# Patient Record
Sex: Male | Born: 1955 | Race: White | Hispanic: No | Smoking: Current every day smoker
Health system: Southern US, Community
[De-identification: ages and names within clinical notes are randomized; demographics above are authoritative.]

## PROBLEM LIST (undated history)

## (undated) ENCOUNTER — Ambulatory Visit: Admission: EM

## (undated) DIAGNOSIS — E119 Type 2 diabetes mellitus without complications: Secondary | ICD-10-CM

## (undated) DIAGNOSIS — I10 Essential (primary) hypertension: Secondary | ICD-10-CM

## (undated) DIAGNOSIS — J449 Chronic obstructive pulmonary disease, unspecified: Secondary | ICD-10-CM

---

## 1998-05-11 ENCOUNTER — Ambulatory Visit (HOSPITAL_COMMUNITY): Admission: RE | Admit: 1998-05-11 | Discharge: 1998-05-11 | Payer: Self-pay | Admitting: Cardiology

## 1998-05-30 DIAGNOSIS — I428 Other cardiomyopathies: Secondary | ICD-10-CM | POA: Insufficient documentation

## 2000-06-30 ENCOUNTER — Emergency Department (HOSPITAL_COMMUNITY): Admission: EM | Admit: 2000-06-30 | Discharge: 2000-06-30 | Payer: Self-pay | Admitting: Emergency Medicine

## 2004-09-14 ENCOUNTER — Ambulatory Visit: Payer: Self-pay | Admitting: Internal Medicine

## 2005-03-20 ENCOUNTER — Ambulatory Visit: Payer: Self-pay | Admitting: Family Medicine

## 2005-04-23 ENCOUNTER — Ambulatory Visit: Payer: Self-pay | Admitting: Family Medicine

## 2005-05-13 ENCOUNTER — Ambulatory Visit: Payer: Self-pay | Admitting: Family Medicine

## 2005-05-29 ENCOUNTER — Ambulatory Visit: Payer: Self-pay | Admitting: Family Medicine

## 2005-09-28 ENCOUNTER — Emergency Department: Payer: Self-pay | Admitting: General Practice

## 2005-09-28 ENCOUNTER — Other Ambulatory Visit: Payer: Self-pay

## 2005-11-28 ENCOUNTER — Ambulatory Visit: Payer: Self-pay | Admitting: Family Medicine

## 2005-12-02 ENCOUNTER — Ambulatory Visit: Payer: Self-pay | Admitting: Family Medicine

## 2005-12-18 ENCOUNTER — Ambulatory Visit: Payer: Self-pay | Admitting: Family Medicine

## 2005-12-23 ENCOUNTER — Ambulatory Visit: Payer: Self-pay | Admitting: Family Medicine

## 2006-01-03 ENCOUNTER — Ambulatory Visit: Payer: Self-pay | Admitting: Family Medicine

## 2006-03-24 ENCOUNTER — Ambulatory Visit: Payer: Self-pay | Admitting: Family Medicine

## 2006-07-01 ENCOUNTER — Ambulatory Visit: Payer: Self-pay | Admitting: Family Medicine

## 2006-07-03 ENCOUNTER — Ambulatory Visit: Payer: Self-pay | Admitting: Family Medicine

## 2006-07-31 ENCOUNTER — Emergency Department (HOSPITAL_COMMUNITY): Admission: EM | Admit: 2006-07-31 | Discharge: 2006-08-01 | Payer: Self-pay | Admitting: Emergency Medicine

## 2006-09-23 ENCOUNTER — Ambulatory Visit: Payer: Self-pay | Admitting: Family Medicine

## 2006-09-30 ENCOUNTER — Ambulatory Visit: Payer: Self-pay | Admitting: Family Medicine

## 2006-10-06 ENCOUNTER — Ambulatory Visit: Payer: Self-pay | Admitting: Internal Medicine

## 2006-10-07 ENCOUNTER — Ambulatory Visit: Payer: Self-pay | Admitting: Gastroenterology

## 2006-10-28 ENCOUNTER — Ambulatory Visit: Payer: Self-pay | Admitting: Psychiatry

## 2006-10-28 ENCOUNTER — Inpatient Hospital Stay (HOSPITAL_COMMUNITY): Admission: AD | Admit: 2006-10-28 | Discharge: 2006-10-30 | Payer: Self-pay | Admitting: Psychiatry

## 2006-11-13 ENCOUNTER — Ambulatory Visit: Payer: Self-pay | Admitting: Family Medicine

## 2006-12-08 ENCOUNTER — Ambulatory Visit: Payer: Self-pay | Admitting: Cardiology

## 2006-12-08 ENCOUNTER — Ambulatory Visit: Payer: Self-pay | Admitting: Internal Medicine

## 2006-12-08 ENCOUNTER — Inpatient Hospital Stay (HOSPITAL_COMMUNITY): Admission: EM | Admit: 2006-12-08 | Discharge: 2006-12-10 | Payer: Self-pay | Admitting: Emergency Medicine

## 2006-12-08 ENCOUNTER — Encounter: Payer: Self-pay | Admitting: Cardiology

## 2007-01-13 ENCOUNTER — Ambulatory Visit: Payer: Self-pay | Admitting: Internal Medicine

## 2007-01-15 ENCOUNTER — Ambulatory Visit: Payer: Self-pay | Admitting: *Deleted

## 2007-01-30 ENCOUNTER — Encounter: Payer: Self-pay | Admitting: Internal Medicine

## 2007-01-30 DIAGNOSIS — J4489 Other specified chronic obstructive pulmonary disease: Secondary | ICD-10-CM | POA: Insufficient documentation

## 2007-01-30 DIAGNOSIS — J449 Chronic obstructive pulmonary disease, unspecified: Secondary | ICD-10-CM

## 2007-01-30 DIAGNOSIS — F17200 Nicotine dependence, unspecified, uncomplicated: Secondary | ICD-10-CM

## 2007-01-30 DIAGNOSIS — I10 Essential (primary) hypertension: Secondary | ICD-10-CM | POA: Insufficient documentation

## 2007-01-30 DIAGNOSIS — F1010 Alcohol abuse, uncomplicated: Secondary | ICD-10-CM | POA: Insufficient documentation

## 2007-01-30 DIAGNOSIS — F1020 Alcohol dependence, uncomplicated: Secondary | ICD-10-CM | POA: Insufficient documentation

## 2007-02-17 ENCOUNTER — Ambulatory Visit: Payer: Self-pay | Admitting: Internal Medicine

## 2007-02-26 ENCOUNTER — Ambulatory Visit: Payer: Self-pay | Admitting: Internal Medicine

## 2007-03-05 ENCOUNTER — Ambulatory Visit: Payer: Self-pay | Admitting: *Deleted

## 2007-03-16 ENCOUNTER — Emergency Department (HOSPITAL_COMMUNITY): Admission: EM | Admit: 2007-03-16 | Discharge: 2007-03-16 | Payer: Self-pay | Admitting: Emergency Medicine

## 2007-04-19 ENCOUNTER — Ambulatory Visit: Payer: Self-pay | Admitting: Psychiatry

## 2007-04-19 ENCOUNTER — Inpatient Hospital Stay (HOSPITAL_COMMUNITY): Admission: AD | Admit: 2007-04-19 | Discharge: 2007-04-20 | Payer: Self-pay | Admitting: Psychiatry

## 2007-06-11 ENCOUNTER — Ambulatory Visit: Payer: Self-pay | Admitting: Internal Medicine

## 2007-06-12 ENCOUNTER — Encounter (INDEPENDENT_AMBULATORY_CARE_PROVIDER_SITE_OTHER): Payer: Self-pay | Admitting: Internal Medicine

## 2007-06-12 LAB — CONVERTED CEMR LAB
ALT: 14 units/L (ref 0–53)
AST: 19 units/L (ref 0–37)
BUN: 25 mg/dL — ABNORMAL HIGH (ref 6–23)
Creatinine, Ser: 1.75 mg/dL — ABNORMAL HIGH (ref 0.40–1.50)
Eosinophils Absolute: 0.1 10*3/uL (ref 0.0–0.7)
Glucose, Bld: 92 mg/dL (ref 70–99)
Lymphocytes Relative: 20 % (ref 12–46)
Monocytes Relative: 14 % — ABNORMAL HIGH (ref 3–11)
Neutro Abs: 3.3 10*3/uL (ref 1.7–7.7)
Potassium: 4 meq/L (ref 3.5–5.3)
RBC: 3.99 M/uL — ABNORMAL LOW (ref 4.22–5.81)
Sodium: 134 meq/L — ABNORMAL LOW (ref 135–145)
TSH: 1.636 microintl units/mL (ref 0.350–5.50)
Total Bilirubin: 0.6 mg/dL (ref 0.3–1.2)
WBC: 5.3 10*3/uL (ref 4.0–10.5)

## 2007-06-18 ENCOUNTER — Ambulatory Visit: Payer: Self-pay | Admitting: Internal Medicine

## 2007-06-18 DIAGNOSIS — G2581 Restless legs syndrome: Secondary | ICD-10-CM | POA: Insufficient documentation

## 2007-06-18 LAB — CONVERTED CEMR LAB
BUN: 13 mg/dL (ref 6–23)
Calcium: 9.4 mg/dL (ref 8.4–10.5)
Creatinine, Ser: 1.1 mg/dL (ref 0.40–1.50)
Glucose, Bld: 87 mg/dL (ref 70–99)
Potassium: 3.7 meq/L (ref 3.5–5.3)
Sodium: 138 meq/L (ref 135–145)

## 2007-07-18 ENCOUNTER — Emergency Department (HOSPITAL_COMMUNITY): Admission: EM | Admit: 2007-07-18 | Discharge: 2007-07-18 | Payer: Self-pay | Admitting: Emergency Medicine

## 2007-07-19 ENCOUNTER — Inpatient Hospital Stay (HOSPITAL_COMMUNITY): Admission: EM | Admit: 2007-07-19 | Discharge: 2007-07-23 | Payer: Self-pay | Admitting: Emergency Medicine

## 2007-08-19 ENCOUNTER — Emergency Department (HOSPITAL_COMMUNITY): Admission: EM | Admit: 2007-08-19 | Discharge: 2007-08-19 | Payer: Self-pay | Admitting: Emergency Medicine

## 2007-09-08 ENCOUNTER — Encounter (INDEPENDENT_AMBULATORY_CARE_PROVIDER_SITE_OTHER): Payer: Self-pay | Admitting: Nurse Practitioner

## 2007-11-15 ENCOUNTER — Inpatient Hospital Stay (HOSPITAL_COMMUNITY): Admission: EM | Admit: 2007-11-15 | Discharge: 2007-11-20 | Payer: Self-pay | Admitting: Emergency Medicine

## 2007-12-20 ENCOUNTER — Inpatient Hospital Stay (HOSPITAL_COMMUNITY): Admission: EM | Admit: 2007-12-20 | Discharge: 2007-12-24 | Payer: Self-pay | Admitting: Emergency Medicine

## 2008-01-19 ENCOUNTER — Encounter (INDEPENDENT_AMBULATORY_CARE_PROVIDER_SITE_OTHER): Payer: Self-pay | Admitting: Internal Medicine

## 2008-03-07 ENCOUNTER — Telehealth (INDEPENDENT_AMBULATORY_CARE_PROVIDER_SITE_OTHER): Payer: Self-pay | Admitting: Internal Medicine

## 2008-03-09 ENCOUNTER — Encounter (INDEPENDENT_AMBULATORY_CARE_PROVIDER_SITE_OTHER): Payer: Self-pay | Admitting: Internal Medicine

## 2008-03-18 ENCOUNTER — Ambulatory Visit: Payer: Self-pay | Admitting: Internal Medicine

## 2008-03-18 DIAGNOSIS — K861 Other chronic pancreatitis: Secondary | ICD-10-CM | POA: Insufficient documentation

## 2008-03-18 DIAGNOSIS — F329 Major depressive disorder, single episode, unspecified: Secondary | ICD-10-CM | POA: Insufficient documentation

## 2008-03-18 LAB — CONVERTED CEMR LAB
BUN: 11 mg/dL (ref 6–23)
CO2: 23 meq/L (ref 19–32)
Calcium: 10.6 mg/dL — ABNORMAL HIGH (ref 8.4–10.5)
Chloride: 94 meq/L — ABNORMAL LOW (ref 96–112)
Creatinine, Ser: 1.1 mg/dL (ref 0.40–1.50)
Glucose, Bld: 102 mg/dL — ABNORMAL HIGH (ref 70–99)

## 2008-03-25 ENCOUNTER — Ambulatory Visit: Payer: Self-pay | Admitting: Internal Medicine

## 2008-03-27 ENCOUNTER — Encounter (INDEPENDENT_AMBULATORY_CARE_PROVIDER_SITE_OTHER): Payer: Self-pay | Admitting: Internal Medicine

## 2008-04-08 ENCOUNTER — Encounter (INDEPENDENT_AMBULATORY_CARE_PROVIDER_SITE_OTHER): Payer: Self-pay | Admitting: Internal Medicine

## 2008-04-12 ENCOUNTER — Telehealth (INDEPENDENT_AMBULATORY_CARE_PROVIDER_SITE_OTHER): Payer: Self-pay | Admitting: Internal Medicine

## 2008-05-19 ENCOUNTER — Telehealth (INDEPENDENT_AMBULATORY_CARE_PROVIDER_SITE_OTHER): Payer: Self-pay | Admitting: Internal Medicine

## 2008-05-26 ENCOUNTER — Encounter (INDEPENDENT_AMBULATORY_CARE_PROVIDER_SITE_OTHER): Payer: Self-pay | Admitting: Internal Medicine

## 2008-06-01 ENCOUNTER — Encounter (INDEPENDENT_AMBULATORY_CARE_PROVIDER_SITE_OTHER): Payer: Self-pay | Admitting: *Deleted

## 2008-06-07 ENCOUNTER — Telehealth (INDEPENDENT_AMBULATORY_CARE_PROVIDER_SITE_OTHER): Payer: Self-pay | Admitting: Internal Medicine

## 2010-11-06 ENCOUNTER — Ambulatory Visit: Payer: Self-pay | Admitting: Internal Medicine

## 2010-11-30 ENCOUNTER — Encounter (INDEPENDENT_AMBULATORY_CARE_PROVIDER_SITE_OTHER): Payer: Self-pay | Admitting: Internal Medicine

## 2010-11-30 ENCOUNTER — Ambulatory Visit
Admission: RE | Admit: 2010-11-30 | Discharge: 2010-11-30 | Payer: Self-pay | Source: Home / Self Care | Attending: Internal Medicine | Admitting: Internal Medicine

## 2010-11-30 DIAGNOSIS — M545 Low back pain: Secondary | ICD-10-CM | POA: Insufficient documentation

## 2010-11-30 LAB — CONVERTED CEMR LAB
Glucose, Bld: 87 mg/dL (ref 70–99)
Ketones, urine, test strip: NEGATIVE
Nitrite: NEGATIVE
Sodium: 138 meq/L (ref 135–145)
Urobilinogen, UA: 1
pH: 5.5

## 2010-12-11 ENCOUNTER — Encounter: Admission: RE | Admit: 2010-12-11 | Payer: Self-pay | Source: Home / Self Care | Admitting: Internal Medicine

## 2010-12-14 ENCOUNTER — Ambulatory Visit: Admit: 2010-12-14 | Payer: Self-pay | Admitting: Internal Medicine

## 2010-12-22 ENCOUNTER — Encounter (INDEPENDENT_AMBULATORY_CARE_PROVIDER_SITE_OTHER): Payer: Self-pay | Admitting: Internal Medicine

## 2010-12-27 NOTE — Letter (Signed)
Summary: *HSN Results Follow up  [facility]  [address] [place], [place] [address]   Phone: [phone]  Fax: [phone]      12/22/2010   [name] [address] [place], [place]  [address]   Dear  Mr. [name],                            ____S.Drinkard,FNP   ____D. Gore,FNP       ____B. McPherson,MD   ____V. Rankins,MD    __X__E. Albena Comes,MD    ____N. Daphine Deutscher, FNP  ____D. Reche Dixon, MD    ____K. Philipp Deputy, MD    ____Other     This letter is to inform you that your recent test(s):  _______Pap Smear    ____X___Lab Test     _______X-ray    ____X__ is within acceptable limits  _______ requires a medication change  _______ requires a follow-up lab visit  _______ requires a follow-up visit with your provider   Comments:       _________________________________________________________ If you have any questions, please contact our office                     Sincerely,  Julieanne Manson MD [facility]

## 2010-12-27 NOTE — Assessment & Plan Note (Signed)
Summary: GET BACK ON HIS MEDS//KT   Vital Signs:  Patient profile:   55 year old male Height:      70 inches Weight:      160 pounds BMI:     23.04 O2 Sat:      96 % on Room air Temp:     97.7 degrees F oral Pulse rate:   110 / minute Pulse rhythm:   regular Resp:     20 per minute BP sitting:   162 / 110  (left arm) Cuff size:   regular  Vitals Entered By: Hale Drone CMA (November 30, 2010 3:34 PM)  O2 Flow:  Room air CC: Has not been here since July 2009. Went to prison in 08/10 -- would like to get back on medications for BP and allergies. 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  Is Patient Diabetic? No Pain Assessment Patient in pain? no       Does patient need assistance? Functional Status Self care Ambulation Impaired:Risk for fall   Serial Vital Signs/Assessments:                                PEF    PreRx  PostRx Time      O2 Sat  O2 Type     L/min  L/min  L/min   By 3:43 PM   96  %   Room air                          Hale Drone CMA  Comments: 3:43 PM Peak Flow: 340 - 470 - 500 By: Hale Drone CMA    CC:  Has not been here since July 2009. Went to prison in 08/10 -- would like to get back on medications for BP and allergies. 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 Marland Kitchen  History of Present Illness: 55 yo male who was seen once between 2008-2009 and has not been seen since here to reestablish. Was incarcerated in prison for drunk driving.  1.  Alcoholism:  Did not drink for 9 months while incarcerated.  Went to [employer] weekly as per parole for 5 weeks--that ended in June of 2010.  Was drinking already, though only occasionally.    2.  Recurrent Pancreatitis:  has not had a recurrence since in prison.  3.  Hypertension:  Needs medication.  Has not been on medication since release from prison  02/2009.    4.  Pain in low back bilaterally.  Can radiate down lateral right leg to knee at  times.  Walking or prolonged standing makes this worse.  Had Xrays in prison--was told had arthritis of back. 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  Naproxen helped.     Current Medications (verified): 1)  Trazodone Hcl 100 Mg  Tabs (Trazodone Hcl) .Marland Kitchen.. 1 Tablet By Mouth At Bedtime As Needed 2)  Adult Aspirin Ec Low Strength 81 Mg  Tbec (Aspirin) .... One By Mouth Qd 3)  Cozaar 100 Mg  Tabs (Losartan Potassium) .... One By Mouth At Bedtime 4)  Sertraline Hcl 100 Mg  Tabs (Sertraline Hcl) .... One By Mouth Daily 5)  Clonidine Hcl 0.1 Mg  Tabs (Clonidine Hcl) .Marland Kitchen.. 1 Tablet By Mouth  Two Times A Day 6)  Nasacort Aq 55 Mcg/act  Aers (Triamcinolone Acetonide(Nasal)) .... 2 Puffs in Each Nostril Daily 7)  Multivitamins   Tabs (Multiple Vitamin) .Marland Kitchen.. 1 Tab By Mouth Daily 8)  B-1 High Potency 100 Mg  Tabs (Thiamine Hcl) .Marland Kitchen.. 1 Tab By Mouth Daily 9)  Prilosec 40 Mg  Cpdr (Omeprazole) .Marland Kitchen.. 1 Cap By Mouth Two Times A Day  Allergies (verified): 1)  Codeine Phosphate (Codeine Phosphate)  Physical Exam  General:  NAD Lungs:  Normal respiratory effort, chest expands symmetrically. Lungs are clear to auscultation, no crackles or wheezes. Heart:  Normal rate and regular rhythm. S1 and S2 normal without gallop, murmur, click, rub or other extra sounds.  Radial pulses normal and equal Abdomen:  Bowel sounds positive,abdomen soft and non-tender without masses, organomegaly or hernias noted. Msk:  NT over lumbosacral spinous processes.  Some tenderness of bilateral paraspinous musculature Neurologic:  alert & oriented X3, strength normal in all extremities, gait normal, and DTRs symmetrical and normal.     Impression & Recommendations:  Problem # 1:  LOW BACK PAIN, CHRONIC (ICD-724.2)  His updated medication list for this problem includes:    Adult Aspirin Ec Low Strength 81 Mg Tbec (Aspirin) ..... One by mouth  qd  Orders: Physical Therapy Referral (PT)  Problem # 2:  ALCOHOLISM (ICD-303.90) Encouraged pt. to get off alcohol completely with hx of pancreatitis and incarceration for DWI Orders: Psychology Referral (Psychology)--Amanda Alessandra Bevels  Problem # 3:  HYPERTENSION (ICD-401.9) Restart meds. His updated medication list for this problem includes:    Lisinopril 40 Mg Tabs (Lisinopril) .Marland Kitchen... 1 tab by mouth daily    Clonidine Hcl 0.1 Mg Tabs (Clonidine hcl) .Marland Kitchen... 1 tablet by mouth two times a day  Orders: T-Basic Metabolic Panel [phone]) UA Dipstick w/o Micro (manual) (09811)  Complete Medication List: 1)  Trazodone Hcl 100 Mg Tabs (Trazodone hcl) .Marland Kitchen.. 1 tablet by mouth at bedtime as needed 2)  Adult Aspirin Ec Low Strength 81 Mg Tbec (Aspirin) .... One by mouth qd 3)  Lisinopril 40 Mg Tabs (Lisinopril) .Marland Kitchen.. 1 tab by mouth daily 4)  Sertraline Hcl 100 Mg Tabs (Sertraline hcl) .... One by mouth daily 5)  Clonidine Hcl 0.1 Mg Tabs (Clonidine hcl) .Marland Kitchen.. 1 tablet by mouth two times a day 6)  Nasacort Aq 55 Mcg/act Aers (Triamcinolone acetonide(nasal)) .... 2 puffs in each nostril daily 7)  Multivitamins Tabs (Multiple vitamin) .Marland Kitchen.. 1 tab by mouth daily 8)  B-1 High Potency 100 Mg Tabs (Thiamine hcl) .Marland Kitchen.. 1 tab by mouth daily 9)  Prilosec 40 Mg Cpdr (Omeprazole) .Marland Kitchen.. 1 cap by mouth two times a day  Other Orders: Pulse Oximetry (single measurment) (94760) Peak Flow Rate ([address]) Flu Vaccine 51yrs + (91478) Admin 1st Vaccine (29562)  Patient Instructions: 1)  Referral to [facility] 2)  NUrse visit for bp check and BMET in 2 weeks--just starting Lisinopril--do not expect bp to be at goal. 3)  Follow up with Dr. Delrae Alfred in 3 months --hypertension/back pain Prescriptions: LISINOPRIL 40 MG TABS (LISINOPRIL) 1 tab by mouth daily  #30 x 6   Entered and Authorized by:   Julieanne Manson MD   Signed by:   Julieanne Manson MD on 12/22/2010   Method used:   Electronically to         [facility] (retail)       [address]       [place]  [place], [place]  [address]       Ph: [phone]       Fax: [phone]   RxID:   [id number]    Orders Added: 1)  Pulse Oximetry (single measurment) [94760] 2)  Peak Flow Rate [[address]] 3)  Flu Vaccine 22yrs + [90658] 4)  Admin 1st Vaccine [90471] 5)  T-Basic Metabolic Panel [[id number]] 6)  Est. Patient Level III [25956] 7)  UA Dipstick w/o Micro (manual) [81002] 8)  Psychology Referral [Psychology] 9)  Physical Therapy Referral [PT]   Immunizations Administered:  Influenza Vaccine # 1:    Vaccine Type: Fluvax 3+    Site: left deltoid    Mfr: GlaxoSmithKline    Dose: 0.5 ml    Route: IM    Given by: Hale Drone CMA    Exp. Date: 05/11/2011    Lot #: [id number]    VIS given: 06/05/10 version given November 30, 2010.  Flu Vaccine Consent Questions:    Do you have a history of severe allergic reactions to this vaccine? no    Any prior history of allergic reactions to egg and/or gelatin? no    Do you have a sensitivity to the preservative Thimersol? no    Do you have a past history of Guillan-Barre Syndrome? no    Do you currently have an acute febrile illness? no    Have you ever had a severe reaction to latex? no    Vaccine information given and explained to patient? yes   Immunizations Administered:  Influenza Vaccine # 1:    Vaccine Type: Fluvax 3+    Site: left deltoid    Mfr: GlaxoSmithKline    Dose: 0.5 ml    Route: IM    Given by: Hale Drone CMA    Exp. Date: 05/11/2011    Lot #: [id number]    VIS given: 06/05/10 version given November 30, 2010.   Laboratory Results   Urine Tests  Date/Time Received: November 30, 2010 5:52 PM   Routine Urinalysis   Color: lt. yellow Appearance: Clear Glucose: negative   (Normal Range: Negative) Bilirubin: negative   (Normal Range: Negative) Ketone: negative   (Normal Range: Negative) Spec. Gravity: 1.010    (Normal Range: 1.003-1.035) Blood: negative   (Normal Range: Negative) pH: 5.5   (Normal Range: 5.0-8.0) Protein: negative   (Normal Range: Negative) Urobilinogen: 1.0   (Normal Range: 0-1) Nitrite: negative   (Normal Range: Negative) Leukocyte Esterace: negative   (Normal Range: Negative)

## 2011-03-26 NOTE — H&P (Signed)
NAMESTAFFORD, Ian NO.:  [id number]   MEDICAL RECORD NO.:  [id number]          PATIENT TYPE:  INP   LOCATION:  5006                         FACILITY:  [facility]   PHYSICIAN:  Lucita Ferrara, MD         DATE OF BIRTH:  01-11-1956   DATE OF ADMISSION:  11/15/2007  DATE OF DISCHARGE:                              HISTORY & PHYSICAL   PRIMARY CARE PHYSICIAN:  [facility].   HISTORY OF PRESENT ILLNESS:  The patient is a 55 year old male who  presents here with severe abdominal pain, mid epigastric, started  earlier today radiating to the back accompanied nausea and vomiting that  is intractable.  Pain is described as sharp.  Apparently, the patient  has been drinking and has a history of alcohol induced pancreatitis.  Last hospitalization was in September for this purpose, pancreatitis.  Otherwise, review of systems is negative.  Denies any chest pain or  shortness of breath.  He also denies any hematochezia, hematemesis.  Denies any dark or black stools.   PAST MEDICAL HISTORY:  1. History of alcohol induced pancreatitis.  2. Gastroesophageal reflux disease.  3. Hypertension.  4. COPD.  5. Chronic pancreatitis.  6. Depression.   ALLERGIES:  CODEINE AND LATEX.   MEDICATIONS:  1. Protonix 40 mg p.o. daily.  2. Aspirin 81 mg daily.  3. Cozaar 2 mg p.o. daily.   SOCIAL HISTORY:  He lives by himself, smokes about 1-2 packs per day.  Drinks about 12 beers daily.   FAMILY HISTORY:  Noncontributory.   REVIEW OF SYSTEMS:  Otherwise, 12-point review of systems is negative.   PHYSICAL EXAMINATION:  VITAL SIGNS:  Blood pressure 142/102, pulse 99,  respirations 22, pulse oximetry 95% on room air.  HEENT:  Normocephalic, atraumatic.  Sclerae anicteric.  PERRLA.  Extraocular movements intact.  NECK:  Supple.  No JVD.  No carotid bruits.  CARDIOVASCULAR:  S1, S2.  Regular rate and rhythm.  No murmurs, rubs or  clicks.  LUNGS:  Clear to auscultation bilaterally.  No  rhonchi, rales or  wheezes.  ABDOMEN:  Soft, diffuse tenderness to deep palpation, especially in the  epigastric area.  Positive bowel sounds.   LABORATORY DATA:  Lipase 801, blood alcohol level 9.  Complete metabolic  panel:  Sodium 133, potassium 4.4, chloride 101, CO2 21, glucose 104,  BUN 8, creatinine 0.72.  AST 43, ALT 33.  Total protein 7.1, calcium  9.0.  CBC:  White count 17.3, hemoglobin 16.7, hematocrit 47.6,  platelets 148.   Chest x-ray not done in the emergency room here.  EKG is also not done.   ASSESSMENT/PLAN:  1. This is a 55 year old with acute alcohol induced pancreatitis, most      likely.  Will go ahead and admit him n.p.o.  Will monitor pain and      nausea with IV medications.  CT of the abdomen and pelvis for      completeness.  Will monitor for resolution.  2. For alcohol detox and alcohol intoxication.  Will go ahead and  start Librium protocol and multivitamins, thiamine and folate.      Will institute fall and aspiration precautions.  Will also have      p.r.n. medications for agitation.  3. For hypertension will continue Cozaar.  For COPD, nebulizer      treatments as needed.  4. For depression, continue Zoloft.      Lucita Ferrara, MD  Electronically Signed     RR/MEDQ  D:  11/15/2007  T:  11/15/2007  Job:  [id number]

## 2011-03-26 NOTE — Discharge Summary (Signed)
[name], [name]                 ACCOUNT NO.:  [id number]   MEDICAL RECORD NO.:  [id number]          PATIENT TYPE:  INP   LOCATION:  1513                         FACILITY:  [facility]   PHYSICIAN:  Marcellus Scott, MD     DATE OF BIRTH:  Mar 19, 1956   DATE OF ADMISSION:  12/20/2007  DATE OF DISCHARGE:  12/24/2007                               DISCHARGE SUMMARY   DISCHARGE DIAGNOSES:  1. Acute on chronic pancreatitis.  2. Hypokalemia.  3. Diarrhea.  4. Thrombocytopenia.  5. Anemia.  6. Hypertension.  7. Alcohol dependence.  8. Tobacco abuse.  9. Diffuse bladder wall thickening- for outpatient urology consult.   DISCHARGE MEDICATIONS:  1. Nasacort at previous home dose.  2. Cozaar 100 mg p.o. daily.  3. Protonix 40 mg p.o. twice daily.  4. Zoloft 100 mg p.o. daily.  5. Trazodone 100 mg p.o. nightly p.r.n.  6. Clonidine 0.1 mg p.o. twice daily.  7. Multivitamin one p.o. daily.  8. Folate 2 mg p.o. daily.  9. Thiamine 100 mg p.o. daily.  10.Ativan 1 mg p.o. daily p.r.n. x2 tablets.  11.Tylenol 650 mg p.o. q.4-6h. p.r.n.  12.Oxycodone 5 mg p.o. q.4-6h. p.r.n. for moderate to severe pain,      dispense 10 tablets.   PROCEDURES:  1. CT of the abdomen with contrast.  Impression:  Acute pancreatitis.      Fatty liver.  Crossed renal ectopia, left kidney inferior and      anterior to right kidney without definite fusion.  2. CT pelvis with contrast.  Impression:  Nonspecific free pelvic      fluid.  Diffuse bladder wall thickening, nonspecific, question      outlet obstruction or inflammatory process.  3. Acute abdominal series with chest XRay.  Impression:  No acute      abnormalities. Improved ariation of the lung base since November 16, 2007.   PERTINENT LABORATORY DATA:  Magnesium 1.9, lipase 64.  Basic metabolic  panel with sodium 134, potassium 3.5, chloride 101, bicarb 27, glucose  121, BUN 3, creatinine 1.88, calcium 8.7.  CBC with hemoglobin 10.8,  hematocrit 30.6, wbc  4.9, platelets 92.  CBC on admission had wbc of  19.4, hemoglobin 16.8 and platelets 117.  INR 0.9.  Blood alcohol level  was 10.5.  Lipase 809.  Urine drug screen as positive for opiates.   CONSULTATIONS:  None.   HOSPITAL COURSE AND DISPOSITION:  Please refer to the history and  physical note for initial admission details.  In summary, [name] is a  pleasant 55 year old Caucasian male patient with history of chronic  pancreatitis recently admitted in January 2009 and September 2008, for  acute pancreatitis.  He now presented suggesting that he might have  pancreatitis again with history of abdominal pain, nausea, vomiting.  He  denied any fevers.  Further evaluation in the emergency room revealed  patient was afebrile, elevated blood pressure of 158/110 and abdomen  with tenderness and guarding.  Further evaluation revealed acute non-  necrotic pancreatitis.  Patient was admitted to the hospital for  further  evaluation and management.   PROBLEM #1 -  ACUTE ON CHRONIC PANCREATITIS, LIKELY RELATED TO ALCOHOL  ABUSE:  Patient was admitted to the hospital.  He was placed on IV fluid  hydration and made n.p.o.  He was provided pain medications and his  serum lipases were serially followed.  Over the next few days, patient's  pain has progressively decreased following which his diet was graduated  which he has tolerated.  Today, he indicates that he has no complaints  and is eager to go home.  His lipase is still mildly elevated, however,  clinically he looks much improved.  Patient is advised to follow up with  his PMD with repeat lipase within a week's time or seek immediate  medical attention if there is any further deterioration of his  condition.  Obviously, he is being advised to abstain from alcohol use.   PROBLEM #2 -  HYPOKALEMIA:  Repleted.   PROBLEM #3 -  DIARRHEA:  resolved.   PROBLEM #4 -  THROMBOCYTOPENIA:  Improving and there is no bleeding.   PROBLEM #5 -  ANEMIA:   Stable, to be evaluated as an outpatient as  deemed necessary.   PROBLEM #6 -  HYPERTENSION:  Controlled without any medications.   PROBLEM #7 -  ALCOHOL DEPENDENCE:  There are no over features of  withdrawal.  Patient will be provided with resources to follow up with  outpatient Alcoholics Anonymous.  He has been counseled regarding  abstinence, which he verbalized understanding.   PROBLEM #8 -  TOBACCO ABUSE:  For cessation counseling.  Will also  provide nicotine patch.   PROBLEM #9 -  THICKENING OF URINARY BLADDER:  To consider urology  consult as an outpatient as deemed necessary.   The patient is to follow up with PMD in less than a week's time with  repeat lipase and CBCs to follow his trend of lipase and hemoglobin and  platelet count.      Marcellus Scott, MD  Electronically Signed     AH/MEDQ  D:  12/24/2007  T:  12/24/2007  Job:  [id number]   cc:   Melvern Banker  Fax: [phone]

## 2011-03-26 NOTE — H&P (Signed)
NAMEVIYAAN, CHAMPINE NO.:  [id number]   MEDICAL RECORD NO.:  [id number]          PATIENT TYPE:  INP   LOCATION:  5735                         FACILITY:  [facility]   PHYSICIAN:  Lonia Blood, M.D.      DATE OF BIRTH:  01/18/1956   DATE OF ADMISSION:  07/19/2007  DATE OF DISCHARGE:                              HISTORY & PHYSICAL   PRIMARY CARE PHYSICIAN:  [facility].  He is unassigned.   PRESENTING COMPLAINT:  Abdominal pain, nausea.   HISTORY OF PRESENT ILLNESS:  Patient is a 55 year old gentleman with  known history of alcoholism and pancreatitis, who started having  abdominal pain, nausea and vomiting about 2 days ago.  He was seen in  the Urgent [facility] and sent home.  Patient returns now with  worsening symptoms to the ED.  He denied any nausea at this point and  now vomiting, but he is having continued abdominal pain.  Reevaluation  in the ED showed elevated lipase and amylase.  Hence, he has been  admitted for further management.  He denied any fever, no nausea or  vomiting, no melena, no hematemesis.  Patient continues to drink up to 7  beers daily, sometimes 12 beers day.   PAST MEDICAL HISTORY:  Significant for hypertension, GERD, chronic  allergies, COPD, chronic pancreatitis, and depression.   ALLERGIES:  ALLERGIC TO CODEINE AND LATEX.   MEDICATIONS:  1. Clonidine.  2. Cozaar.  3. Zoloft.  4. Aspirin.  5. Multivitamin.  6. Nasacort.  7. Protonix.   SOCIAL HISTORY:  He lives with his, apparently in [place].  He smokes  about 1-2 packs cigarettes per day.  He drinks up to 12 beers daily,  although he has cut back to 7 beers daily at this point.   FAMILY HISTORY:  Significant for substance use, including alcohol and  cocaine abuse.   REVIEW OF SYSTEMS:  12-point review of systems is negative except per  history of present illness.   PHYSICAL EXAMINATION:  VITAL SIGNS:  Temperature 97.9, blood pressure  152/99, pulse 99,  respiratory rate 20, saturations 95% on room air.  GENERAL:  Patient is awake, alert, in mild distress due to pain.  HEENT:  PERRLA.  EOMI.  NECK:  Supple.  No JVD.  No lymphadenopathy.  RESPIRATORY:  He has good air entry bilaterally.  No wheezes or rales.  CARDIOVASCULAR:  He is mildly tachycardiac.  ABDOMEN:  Firm, soft, with positive tenderness, especially epigastric to  periumbilical area with positive bowel sounds.  EXTREMITIES:  No edema, cyanosis or clubbing.   LABORATORY DATA:  White count 10.5, hemoglobin 14.5, platelet count 110.  Mild left shift with A1c of 8.6.  Sodium 132, potassium 3.4, chloride  101, BUN 6, glucose 100, creatinine 0.8.  LFTs essentially elevated  bilirubin of 1.5, otherwise AST and ALT are normal.  Alcohol level is  less than 5.  Urinalysis showed trace leukocyte esterase and ketones,  WBC 7-10 and Trichomonas in the urine, but rare bacteria.  His lipase is  615.  Amylase 786.  Urine drug  screen was positive for opiates only.   ASSESSMENT:  This 55 year old gentleman with known history of chronic  pancreatitis and alcoholism, presenting with what appears to be another  acute and chronic pancreatitis.  Patient is also a heavy alcohol drinker  and tobacco abuser.   PLAN:  1. Acute and chronic pancreatitis.  We will admit the patient, start      bowel rest.  Pain control.  Control his nausea and IV fluids, as      well as other supportive measures.  If the patient does not show      any improvement, we will re-CT his abdomen to look for any      pseudocyst or any other thing that warrants surgical intervention.      Otherwise, supportive measures will be all that will be necessary.  2. Polysubstance abuse.  Patient will be on watch for delirium      tremens.  I will use p.r.n. Ativan as needed for any agitation and      once DT sets in, we will put him on the full protocol.  In the      meantime, I will put him on thiamine and folate daily.  I will  also      give him some nicotine patches.  3. COPD.  I will put him on some nebulizers while in the hospital.  4. Hypertension.  Patient will be n.p.o. for now; however, if his      blood pressure continues to rise, we will use IV beta blockers to      control his blood pressure in the interim.  Otherwise, we will      resume his oral medications once he is stable.  5. Depression.  Again, we will hold his Zoloft for now, but if he      shows any sign of worsening symptoms, we will resume all his home      medicines.  6. Trichomoniasis.  I will put him on some Flagyl IV, although      preferably would have been p.o. but patient will have bowel rest at      this point.  7. Hypokalemia.  We will replete his potassium and follow his labs      closely.  8. Hyponatremia.  Again, I will hydrate him with some saline and      follow and see how patient does while in the hospital.  Follow up      treatment will depend on his initial response to treatment.      Lonia Blood, M.D.  Electronically Signed     LG/MEDQ  D:  07/19/2007  T:  07/19/2007  Job:  [id number]

## 2011-03-26 NOTE — Discharge Summary (Signed)
NAMEWILSON, DUSENBERY NO.:  [id number]   MEDICAL RECORD NO.:  [id number]          PATIENT TYPE:  INP   LOCATION:  5006                         FACILITY:  [facility]   PHYSICIAN:  Hillery Aldo, M.D.   DATE OF BIRTH:  1956-06-14   DATE OF ADMISSION:  11/15/2007  DATE OF DISCHARGE:  11/20/2007                               DISCHARGE SUMMARY   PRIMARY CARE PHYSICIAN:  Dr. Delrae Alfred at [facility].   DISCHARGE DIAGNOSES:  1. Acute pancreatitis.  2. Hypertension.  3. Hypokalemia.  4. Alcohol abuse.  5. Gastroesophageal reflux disease.  6. Tobacco abuse.  7. Chronic obstructive pulmonary disease.  8. Depression.   DISCHARGE MEDICATIONS:  1. Multivitamin daily.  2. Vitamin B1 100 mg daily.  3. Zoloft 100 mg daily.  4. Cozaar 100 mg daily.  5. Clonidine 0.1 mg b.i.d.  6. Protonix 40 mg b.i.d.  7. Nasacort AQ 2 puffs in each nostril daily.  8. Trazodone 100 mg nightly.   CONSULTATIONS:  None.   BRIEF ADMISSION HISTORY OF PRESENT ILLNESS:  The patient is a 55-year-  old male with known history of alcohol-induced pancreatitis, who  presents with sharp abdominal pain accompanied by nausea and vomiting.  Upon initial evaluation in the emergency department, he was found to  have an elevated lipase and therefore was admitted for treatment of  acute alcohol-induced pancreatitis.  For the full details, please see  the dictated report done by Dr. Flonnie Overman.   PROCEDURES AND DIAGNOSTIC STUDIES:  1. Chest x-ray on November 15, 2007 showed peribronchial thickening with      no active lung disease.  2. Chest x-ray on November 16, 2007 showed new bibasilar atelectasis and      small new left effusion.  3. CT scan of the abdomen and pelvis on November 16, 2006 showed a small      amount of pelvic ascites, marked thickening of the wall of the      urinary bladder with moderate rectosigmoid fecal material.  There      were peripancreatic inflammatory and edematous changes, poorly  marginated left retroperitoneal fluid connection or phlegmonous      process without abnormal peripheral enhancement.  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   DISCHARGE LABORATORY VALUES:  Sodium was 138, potassium 3.6, chloride  103, bicarb 27, BUN 7, creatinine 0.82, glucose 155.  White blood cell  count was 6.7, hemoglobin 12.2, hematocrit 34.5, platelets 182,000.  Lipase was 39.   HOSPITAL COURSE:  Problem #1 - ACUTE ALCOHOL-INDUCED PANCREATITIS:  The  patient was admitted and put on bowel rest.  He was given IV fluid  rehydration, and IV pain and antiemetic medications.  As his abdominal  pain subsided, a diet was slowly introduced and advanced as tolerated.  His lipase normalized and has been normal for over 48 hours.  The  patient's pain has diminished and he is tolerating a solid diet without  any difficulties.  She was put on Zosyn  after spiking a fever, but has  not had any evidence of leukocytosis or recurrent fever and the Zosyn  was discontinued without any further respiking of fever or evidence of  occult infection   PROBLEM #2 - HYPERTENSION:  The patient's blood pressures medications  were initially held and he was hydrated.  He became hypertensive and his  blood pressure medications were resumed.   PROBLEM #3 - HYPOKALEMIA:  The patient's potassium was appropriately  repleted.   PROBLEM #4 - ALCOHOL ABUSE:  The patient was put on an Ativan taper and  supplemented with thiamine and folic acid.  He will be referred to [facility]  for outpatient treatment.   PROBLEM #5 - Gastroesophageal reflux disease:  The patient was  maintained on proton pump inhibitor therapy.   PROBLEM #6 - TOBACCO ABUSE:  The patient was counseled on the importance  of cessation.  At this time, he is not interested in pursuing this as an  option, but knows that in order to minimize the risk of  lung disease and  other medical problems, he does need to quit smoking.   DISPOSITION:  The patient is medically stable and will be discharged  home.  He is instructed to follow up with Dr. Delrae Alfred in 1-2 weeks.      Hillery Aldo, M.D.  Electronically Signed     CR/MEDQ  D:  11/20/2007  T:  11/20/2007  Job:  [id number]   cc:   Marcene Duos, M.D.

## 2011-03-26 NOTE — H&P (Signed)
NAME:  [name], [name]                 ACCOUNT NO.:  [id number]   MEDICAL RECORD NO.:  [id number]          PATIENT TYPE:  INP   LOCATION:  1513                         FACILITY:  [facility]   PHYSICIAN:  Darryl D. Prime, MD    DATE OF BIRTH:  1956/02/20   DATE OF ADMISSION:  12/20/2007  DATE OF DISCHARGE:                              HISTORY & PHYSICAL   Patient is seen by [facility].  He is full code.  History  was obtained from the patient.   CHIEF COMPLAINT:  Pain.  I think I have pancreatitis again.   HISTORY OF PRESENT ILLNESS:  [name] is a 55 year old male with a  history of chronic pancreatitis with recent admissions in January of  2009 and September of 2008 who notes abdominal pain for 24 hours.  This  pain is a dull ache.  It gets sharp particularly when he hiccups and  very severe when he hiccups and has prevented him from sleeping and  eating and also from drinking.  He has also had associated nausea,  vomiting, sweats, and there is no blood in the emesis.  He denies any  fever.  He also notes some presyncopal symptoms 12 hours prior to  admission.  He took some Protonix but it has not helped.  In the  emergency room, he was given IV fluids, Zofran, and Dilaudid.  His last  alcoholic beverage was 24 hours ago.   PAST MEDICAL HISTORY/PAST SURGICAL HISTORY:  1. History of COPD.  2. Hypertension.  3. Tobacco abuse.  4. History of congestive heart failure with cardiomyopathy with an EF      of 38% in 1999.  Echo since done January of 2008 shows ejection      fraction of 60%.  5. History of chronic pancreatitis as above.  6. History of possible polysubstance abuse.  7. History of depression.   MEDICATIONS:  He is on:  1. Protonix 40 mg daily.  2. Clonidine 0.1 mg twice a day.  3. Cozaar 100 mg daily.  4. Trazodone 100 mg q.h.s.  5. Nasacort.  6. Zoloft 100 mg daily.  7. He also takes B1 vitamin and multivitamin supplements.   ALLERGIES:  1. LATEX.  2.  CODEINE.   SOCIAL HISTORY:  He lives by himself.  Smokes 1 to 2 packs a day and  drinks about 12 beers a day.   FAMILY HISTORY:  Positive for congestive heart failure and Alzheimer's  in the father.  Mother has hypertension and borderline diabetes.   REVIEW OF SYSTEMS:  Fourteen-point review of systems is negative unless  stated above.   PHYSICAL EXAM:  GENERAL:  He is a very thin male sitting upright having  significant discomfort with hiccups.  Temperature is 97.8 with a pulse  of 103.  Respiratory rate of 14 to 20.  Blood pressure 168/110.  Sat 98%  on 2 L.  Conjunctivae are not pale and anicteric.  Sclerae as noted.  The oropharynx is dry.  NECK:  Supple with no lymphadenopathy or thyromegaly.  CARDIOVASCULAR:  Exam is regular rhythm and  rate with no murmurs, rubs,  or gallops.  LUNGS:  Clear to auscultation bilaterally.  ABDOMEN:  Significant voluntary guarding and significant pain to  palpation in the epigastrium.  The patient has no signs of rebound,  tenderness, or involuntary guarding.  Decreased bowel sounds.  EXTREMITIES:  Show no clubbing, cyanosis, or edema.  NEUROLOGICAL:  He is alert and oriented x4 with cranial nerves II-XII  grossly intact.  Strength and sensation grossly intact.   LABS:  Show a sodium of 134, potassium of 5.4, chloride 95, BUN 25,  chloride 95 with CO2 of 25, BUN 9, creatinine 1.1, glucose 170, alk phos  128, otherwise normal LFTs.  White count is elevated at 19.4, hemoglobin  16.5, hematocrit 47.5, platelets 117, INR 0.9, calcium was 9.5, albumin  4.3, lipase is 809, alcohol less than 5.  CT of the abdomen on the  November 17, 2007, showed some small amount of pelvic ascites with  peripancreatic inflammatory changes with edema.  There was a left  retroperitoneal fluid collection or phlegmon.  Plain x-ray done of the  abdomen was negative.   ASSESSMENT AND PLAN:  This is a patient who presents with acute on  chronic pancreatitis.  He has an  elevated white count and a history of  an abnormal CT and possible fluid collection in the area of the  pancreas.  He will be admitted for intravenous fluids and he will be  n.p.o. and will get a CT of the abdomen and pelvis to assess if there is  any progression of this collection.  We will counsel concerning  discontinuing alcohol.  We will treat him with cefepime intravenous  empirically for possible superimposed infection in the area of the  pancreas and we will get blood cultures x2.  For his alcohol abuse, he  will be given a multivitamin and thiamine following intravenous fluids  and we will check for possible withdrawal.  We will check a urine drug  screen.  He will be treated with sedation protocol.  For his decreased  platelets, he has a history of this.  We will get Hemoccult x3 and hold  all anticoagulants.  For hypertension, we will continue his blood  pressure medications.  For his hiccups which are associated with  significant pain, will give chlorpromazine 4 times a day.  For his  hypovolemia, we will give intravenous fluids.  He will receive  prophylaxis, gastrointestinal and deep venous thrombosis prophylaxis.      Darryl D. Prime, MD  Electronically Signed     DDP/MEDQ  D:  12/20/2007  T:  12/21/2007  Job:  [id number]

## 2011-03-26 NOTE — Discharge Summary (Signed)
[name], BLACK NO.:  [id number]   MEDICAL RECORD NO.:  [id number]          PATIENT TYPE:  INP   LOCATION:  5735                         FACILITY:  [facility]   PHYSICIAN:  Altha Harm, MDDATE OF BIRTH:  1956/05/29   DATE OF ADMISSION:  07/19/2007  DATE OF DISCHARGE:  07/23/2007                               DISCHARGE SUMMARY   DISCHARGE DISPOSITION:  Home.   FINAL DISCHARGE DIAGNOSES:  1. Acute pancreatitis, resolved.  2. Hypertension.  3. History of depression.  4. History of gastroesophageal reflux disease.  5. Chronic pancreatitis.  6. History of chronic obstructive pulmonary disease.  7. CHRONIC ALLERGIES.   DISCHARGE MEDICATIONS:  1. Clonidine 0.1 mg p.o. b.i.d.  2. Cozaar 50 mg p.o. daily.  3. Prilosec 40 mg p.o. daily.  4. Aspirin 81 mg p.o. daily.  5. Zoloft 100 mg p.o. daily.  6. Trazodone 100 mg p.o. q.h.s.  7. Nasacort intranasal spray one puff to each nostril.   CONSULTANTS:  None.   PROCEDURES:  None.   DIAGNOSTIC STUDIES:  None.   PERTINENT LABORATORY STUDIES:  Lipase at the time of discharge 161.   White blood cell count 4.9, hemoglobin 11.6, hematocrit 32.9, platelet  count 106, sodium 141, potassium 3.4, chloride 103, bicarb 29, BUN 6,  creatinine 0.79.   CODE STATUS:  Full code.   ALLERGIES:  ARE TO CODEINE AND LATEX.   CHIEF COMPLAINT:  Abdominal pain and nausea.   HISTORY OF PRESENT ILLNESS:  Please see the H&P dictated by Dr. Mikeal Hawthorne  for details of the HPI.   HOSPITAL COURSE:  1. Acute pancreatitis.  The patient on admission was found to have      acute pancreatitis with a markedly elevated lipase.  The patient      was admitted and given bowel rest.  He was given aggressive      hydration and his lipase levels and pain monitored.  When the      patient had a significant decrease in his pain, he was resumed on      diet.  The patient's diet was advanced to a low-fat diet which he      tolerated without  difficulty.  The patient's lipase is down to 161,      however the patient has no abdominal tenderness or pain at this      time.  Thus, the patient is being discharged on a low-fat diet.  2. Hypertension.  The patient had a history of hypertension and was on      Catapres and Cozaar.  However, because of his NPO status, he was      started on a clonidine patch and IV beta blockers used on a p.r.n.      basis.  Once the patient was able to eat, he was resumed on his      oral medications and is being discharged without any change in his      usual oral medications.  3. History of depression.  The patient had been on Trazodone and      Zoloft on  an outpatient basis and is being resumed on these      medications.  4. Trichomoniasis.  The patient was started on IV Flagyl due to      Trichomonas found in the urine.  However, the patient has completed      his treatment and does not need any further Flagyl as an      outpatient.  5. Hypokalemia.  This was repleted and the patient is eukalemic at      this point.  6. Hyponatremia.  Again, the patient was hydrated with IV fluids and      is eunatremic at this point.  7. All other medical problems remained stable.  8. The patient does have polysubstance abuse in the past and was      observed for any evidence of DTs, however the patient showed no      evidence of withdrawal during this hospitalization and was put on      thiamine and folate daily.  The patient was also put on Nicotine      patches while hospitalized.  9. Tobacco abuse disorder.  The patient was counseled against further      tobacco use, however the patient is in a precontemplative state at      this time.  He was placed on Nicotine patches during his      hospitalization for treatment of nicotine addiction.   DIETARY RESTRICTIONS:  The patient should be on a low fat, low sodium,  cardiac-prudent diet.   PHYSICAL RESTRICTIONS:  None.      Altha Harm, MD   Electronically Signed     MAM/MEDQ  D:  07/23/2007  T:  07/23/2007  Job:  [id number]

## 2011-03-29 NOTE — H&P (Signed)
[name], [name] NO.:  [id number]   MEDICAL RECORD NO.:  [id number]          PATIENT TYPE:  IPS   LOCATION:  0503                          FACILITY:  [facility]   PHYSICIAN:  Geoffery Lyons, M.D.      DATE OF BIRTH:  June 22, 1956   DATE OF ADMISSION:  10/28/2006  DATE OF DISCHARGE:                       PSYCHIATRIC ADMISSION ASSESSMENT   IDENTIFYING INFORMATION:  This is a 55 year old married white male.  This is a voluntary admission.   HISTORY OF PRESENT ILLNESS:  This patient presents requesting inpatient  detox after attempting outpatient detox over the past couple of weeks  and subsequently relapsing on alcohol.  He is currently drinking over  the course of the past four days, five 40-ounce beers and two 12-packs  as he relapsed.  Feels he is unable to control his drinking.  This 73-  year-old father of three has been using alcohol since he was in his  early 23s, drinking regularly through most of his life with seven years  of sobriety at one time, at which time he detoxed himself independently.  This was in the distant past.  Now, he has been unable to stay sober.  Tried to stop on his own.  Becomes very shaky, sweaty, unable to eat or  sleep.  He felt that the Librium was ineffective when he tried to do it  on his own.  Trigger for the most recent relapse is concerns about his  wife's health.  He is divorced from his first wife and his current wife  has severe medical problems with chronic pain and this is not improving.  The patient denies any other substance abuse.  Endorses some depressed  mood with no suicidal thoughts.   PAST PSYCHIATRIC HISTORY:  This is one of several detoxes for this  patient who is currently under the care of counseling at [facility].  Last detox was in 1984 at the [facility] and he has also been seen  at [facility] in 2003.  This is his first admission to [facility].  Distant history of some polysubstance  abuse in his  teens.  None currently.  A long history of alcohol abuse with inability  to detox as an outpatient.  No prior suicide attempts.  He was started  on Zoloft approximately five years ago for an episode of depression and  has been on this much for at least 3-5 years.   SOCIAL HISTORY:  Has a good relationship with current wife who was on  disability due to back problems.  Supportive marriage.  Significant  financial stressors.  The patient was laid off work in 2006 with limited  job capacity due to his medical problems.  Currently living with his  wife and 48 year old son.  Has a 23 year old son at [facility] and  an 38-year-old stepson who he raised that he no longer has contact with.  He does have a DWI charge pending.   FAMILY HISTORY:  Remarkable for son who abuses cocaine and marijuana.   ALCOHOL/DRUG HISTORY:  See above.   MEDICAL HISTORY:  The patient is followed at [facility] and  Pulmonology.  Problems are history of chronic pancreatitis for which he  is followed at [facility] and cardiomyopathy, chronic obstructive  pulmonary disease, and hypertension.  Full past medical history is noted  in the record.   MEDICATIONS:  Clonidine 0.1 mg p.o. b.i.d., Cozaar 50 mg daily, Zoloft  50 mg daily, Prilosec 60 mg b.i.d., aspirin 81 mg daily and Flonase.   ALLERGIES:  CODEINE which causes nightmares.   REVIEW OF SYSTEMS:  Noted in the record.   POSITIVE PHYSICAL FINDINGS:  Full physical exam is noted in the record  and is a generally unremarkable.  Tall, thin male in no acute distress.  Height 5 feet 11 inches tall, weight 149 pounds, temperature 98.7, pulse  110, respirations 18, blood pressure 145/93.  He does have a slightly  anxious appearance and physical exam is noted in the record.   LABORATORY DATA:  Normal CBC, hemoglobin 14.2, hematocrit 41.7,  platelets 222,000, MCV 94.4.  Chemistries are within normal limits.  BUN  6, creatinine 0.86, normal  casual glucose.  Liver enzymes SGOT 37, SGPT  30, alkaline phosphatase 95 and total bilirubin 0.6.  His TSH is  currently pending.  Normal magnesium level at 1.7 and calcium 9.5.   MENTAL STATUS EXAM:  Fully alert male, anxious affect but alert,  cooperative.  Affect is constricted.  He does get readily tearful  discussing some of his family problems.  Speech is normal pace, tone,  production, fluent, articulate.  Mood is depressed.  Thought process is  logical, coherent.  Insight is adequate.  He talks freely about wanting  to be abstinent from alcohol.  He has made a pact with his 10 year old  son that, if the 30 year old would get clean and sober, then so would he  so they can support each other.  No evidence of psychosis.  No guarding.  No paranoia.  No homicidal or suicidal thoughts.  Does have some  depressed mood and reports that the Zoloft was helpful in the past.  Cognition is well-preserved.  Memory intact.  Concentration is a bit  decreased but within normal limits.   DIAGNOSES:  AXIS I:  Alcohol dependence.  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  AXIS II:  Deferred.  AXIS III:  Chronic obstructive pulmonary disease, hypertension, chronic  pancreatitis by history and cardiomyopathy.  AXIS IV:  Moderate (stress with financial stressors, being unemployed  and living on wife's limited income; having a stable family and  motivation to be a good father is an asset to him).  AXIS V:  Current 30; past year 50.   PLAN:  To voluntarily admit the patient with a goal of a safe detox  within five days and support him with alleviating his depression.  We  started him on a Librium protocol which he is tolerating well.  CIWA  scores have been ranging around 8.  We are going to increase his Zoloft  to 100 mg daily since this has worked well for him initially many years ago and he felt that it was helpful and we are giving him trazodone 100  mg p.o. q.h.s. and he slept well  with that last night.   ESTIMATED LENGTH OF STAY:  Five days.      Margaret A. Scott, N.P.      Geoffery Lyons, M.D.  Electronically Signed    MAS/MEDQ  D:  10/29/2006  T:  10/29/2006  Job:  [id number] 

## 2011-03-29 NOTE — Discharge Summary (Signed)
[name], [name] NO.:  [id number]   MEDICAL RECORD NO.:  [id number]          PATIENT TYPE:  IPS   LOCATION:  0503                          FACILITY:  [facility]   PHYSICIAN:  Geoffery Lyons, M.D.      DATE OF BIRTH:  10/08/1956   DATE OF ADMISSION:  10/28/2006  DATE OF DISCHARGE:  10/30/2006                               DISCHARGE SUMMARY   CHIEF COMPLAINT AND HISTORY OF PRESENT ILLNESS:  This is one of several  detoxs, the first time at [facility].  This is a 55 year old,  married, white male requesting inpatient detox after attempting  outpatient detox over the past couple of weeks.  Subsequently, relapsing  on alcohol.  Currently, he has been drinking over the course of the past  4 days, five 40 ounce beers, two 12-packs, and he relapsed, unable to  control his drinking.  He has been abusing alcohol since he was in his  early 75s, drinking regularly throughout most of his life with 7 years  of sobriety at one time.  He is unable to stay sober.  As he tries to  stop, he becomes very shaky, sweaty, and unable to eat or sleep.  He  felt like the Librium was ineffective when he tried to do it on his own.   PAST SURGICAL HISTORY:  One of several detox's.  The first time at  [facility].  He had been at [facility].  His last detox in  1984 at [facility].  He has been seen at [facility].   SOCIAL HISTORY:  Alcohol history:  As already stated, persistent use of  alcohol.  No other substances.   PAST MEDICAL HISTORY:  Current pancreatitis, cardiomyopathy, COPD,  hypertension.   MEDICATIONS:  1. Clonidine 0.1 mg twice a day.  2. Cozaar 50 mg per day.  3. Zoloft 50 mg per day.  4. Prilosec 60 mg twice a day.  5. Aspirin 81 mg per day.  6. Flonase.   Physical examination performed but did not show any acute findings.   LABORATORY:  CBC - Hemoglobin 14.2, hematocrit 41.7, platelets 222,000.  Blood chemistry was within normal limits.  BUN 6, creatinine  0.86.  Glucose within normal limits.  Liver enzymes - SGOT 30, SGPT 30.  Total  bilirubin 0.6.  TSH within normal limits.   PHYSICAL EXAMINATION:  This is a fully alert male, anxious, alert,  cooperative.  Affect was constricted.  Readily tearful discussing  some  of his family problems.  Speech was normal in pace, tone and production,  fluent and articulate.  Mood depressed.  Thought processes are logical,  coherent and relevant.  Insight is present.  He talks freely about  wanting to be abstinent from alcohol and that he had made a pact with  his 29 year old son that if his 9 year old son would get clean and  sober, then he will also do it.  No evidence of psychosis.  No suicidal  or homicidal ideation.  No hallucinations.  Cognition well-preserved.   ADMISSION DIAGNOSIS:  AXIS I:  Alcohol dependence, manic  depressive  disorder, not otherwise specified.  AXIS II:  No diagnosis.  AXIS III:  Chronic obstructive pulmonary disease, hypertension, chronic  pancreatitis, cardiomyopathy.  AXIS IV:  Moderate.  AXIS V:  Upon admission 30, highest GAF in past year 60.   HOSPITAL COURSE:  The patient was admitted and started in individual and  group psychotherapy.  He was maintained on clonidine 0.1 mg twice a day,  trazodone 100 mg at bedtime, Zoloft was placed at 100 mg per day.  He  was detoxified with Librium.  As already stated, he has been sober for 7  years.  No license, increased use, COPD, chronic pancreatitis, liver is  going bad and he decided to quit and trying going to [facility].  He  was there for a while.  His wife started having health problems and  could not take him to [facility].  Last weekend, he relapsed with  five 40 ounces of beer, two 12-packs.  Monday night, decided with his  son.  They both decided to clean up.  Endorses chills, sweats, started  feeling shaky, and requested help.  He was laid off early 2006.  He had  worked short-term.  By December 20, he was  full contact with reality.  He was endorsing no suicidal or homicidal ideation, no hallucinations,  no illusions.  He felt he could take it from there.  He was going to  pursue detox with Librium at home.  He was going to pursue further  outpatient treatment.  He felt that he could manage himself without  having to stay in the hospital.   DISCHARGE DIAGNOSES:  AXIS I:  Alcohol dependence; depressive disorder,  not otherwise specified.  AXIS II:  No diagnosis.  AXIS III:  Chronic obstructive pulmonary disease, hypertension, chronic  pancreatitis, cardiomyopathy.  AXIS IV:  Moderate.  AXIS V:  Upon discharge, 50.   DISCHARGE MEDICATIONS:  1. Librium 25 mg, 1 at 6 p.m. on December 20 and Librium 25 mg, 1 at 9      a.m. on December 21, and discontinue.  2. Catapres 0.1 mg twice a day.  3. Cozaar 50 mg per day.  4. Prilosec 40 mg twice a day.  5. Aspirin 81 mg per day.  6. Zoloft increased to 100 mg.  7. Trazodone 100 mg at bedtime.   FOLLOW UP:  Follow up at [facility].      Geoffery Lyons, M.D.  Electronically Signed     IL/MEDQ  D:  11/17/2006  T:  11/18/2006  Job:  [id number]

## 2011-03-29 NOTE — Procedures (Signed)
EEG NUMBER:  [id number].   HISTORY:  This is a 55 year old with alcohol abuse with episode of  jerking.  The patient is having an EEG done to evaluate for seizures.   PROCEDURE:  This is a portable EEG.   TECHNICAL DESCRIPTION:  Throughout this portable EEG, there is a  posterior dominant rhythm of 9-10 Hz activity at 10-20 microvolts.  Background activity is symmetric mostly comprised of alpha and beta  range activity at 10-25 microvolts.  At times during the recording,  there is EMG and electrode artifact that occasionally obscures the  background.  Photic stimulation nor hyperventilation were performed  throughout this recording.  The patient does not go to sleep during this  tracing.  Throughout this record, there is no definitive epileptiform  activity noticed.   IMPRESSION:  This portable EEG at times is technically limited; however,  it is within normal limits in the awake state.      Bevelyn Buckles. Nash Shearer, M.D.  Electronically Signed     EAV:[id number]  D:  12/09/2006 17:35:20  T:  12/10/2006 02:37:43  Job #:  [id number]

## 2011-03-29 NOTE — Consult Note (Signed)
[name], [name]                 ACCOUNT NO.:  [id number]   MEDICAL RECORD NO.:  [id number]          PATIENT TYPE:  INP   LOCATION:  0102                         FACILITY:  [facility]   PHYSICIAN:  Pramod P. Pearlean Brownie, MD    DATE OF BIRTH:  26-Jul-1956   DATE OF CONSULTATION:  12/08/2006  DATE OF DISCHARGE:                                 CONSULTATION   REASON FOR REFERRAL:  1. Seizures.  2. Syncope.   HISTORY OF PRESENT ILLNESS:  Mr. [name] is a 55 year old Caucasian male  who was admitted last night with an episode of unresponsiveness and  shaking, possible seizure activity.  Patient is unable to describe the  event.  He stated that the last thing he remembers was he was walking in  his bedroom to the next room when he must have blacked out because he  woke up in the ambulance while being brought to the [facility].  As per the patient, his family described what they thought was seizure  activity.  The patient had had 6-7 beers last night and he had been  heavily drinking over the weekend and admits to 30 cans of beer from  Friday to Sunday night.  He has a history of heavy alcoholism and has  been doing binge drinking mainly over weekends, he has been doing that  for 20 years.  He has had alcohol related blackouts in the past and does  have alcohol withdrawal symptoms if he does not drink.  He, however,  denies any prior history of alcohol-related seizures or even seizures  otherwise.  There is no childhood history of epilepsy, seizures,  significant head injury.  He does have a remote history of tension  headaches but they are not being quite active right now.  No other  apparent neurological problems.   PAST MEDICAL HISTORY:  Significant for:  1. Hypertension.  2. Depression.  3. Alcoholism.  4. Pancreatitis.   HOME MEDICATIONS:  Clonidine, trazodone, Cozaar, Zoloft, aspirin,  multivitamins.   SOCIAL HISTORY:  Patient is married, lives at home with his wife and  son.   He smokes and drinks.  He denies doing street drugs.   REVIEW OF SYSTEMS:  Positive for tremors, jerking, shaking, loss of  consciousness.   PHYSICAL EXAM:  Reveals a middle-aged Caucasian male __________ not in  distress, is afebrile.  Pulse rate 82 per minute, regular, respiratory  rate 20 per minute, blood pressure 178/109, temperature 98.3, sats 97%  on room air.  Distal pulses are well felt.  HEAD:  Nontraumatic.  NECK:  Supple without bruit.  ENT:  Unremarkable.  CARDIAC:  No murmur or gallop.  LUNGS:  Clear to auscultation.  NEUROLOGIC:  Awake, alert, oriented x3 with normal speech and language  function.  There is no aphasia, apraxia, dysarthria.  Pupils are equal,  reactive, eye movements are full range without nystagmus.  FACE:  Symmetric.  Palatal movements are normal.  Tongue is midline.  MOTOR SYSTEM:  Reveals no upper extremity drift, symmetric strength,  tone, reflexes including ankle jerks.  Lower extremity jerks  are quite  brisk compared to the upper extremities.  Plantars are both downgoing.  He is intact to pinprick, position as well as vibration, sensation.  He  walks with a fairly steady gait but is unsteady on a narrow base with  eyes closed and can walk tandem with only slight difficulty.   DATA REVIEWED:  Labs:  The CBC is unremarkable.  Alcohol level is 210  mg%.  Urine drug screen is negative, electrolytes are normal, liver  enzymes are normal.  Troponin is normal, UA shows 7-10 WBCs.   IMPRESSION:  A 55 year old gentleman with a history of heavy alcohol  drinking with an episode of unresponsiveness with possible choking as  described by the family, most likely alcohol related seizures and  blackout.   PLAN:  1. I do not believe __________ are indicated at the present time      unless the patient starts having seizures unrelated to alcohol.  2. I have counseled him to quit alcohol and he seems to be agreeable.  3. Treat him with IV thiamine.  4.  Alcohol withdrawal precautions.  5. Check EEG and MRI to rule out any structural brain abnormalities.  6. Call for questions.   I have advised the patient not to drive.           ______________________________  Sunny Schlein. Pearlean Brownie, MD     PPS/MEDQ  D:  12/08/2006  T:  12/08/2006  Job:  [id number]   cc:   Corwin Levins, MD  [address]  [place]  [place] [address]

## 2011-03-29 NOTE — Discharge Summary (Signed)
[name], [name]                 ACCOUNT NO.:  [id number]   MEDICAL RECORD NO.:  [id number]          PATIENT TYPE:  INP   LOCATION:  1408                         [facility]ACILITY:  [facility]   PHYSICIAN:  Rosalyn Gess. Norins, MD  DATE OF BIRTH:  1956-10-05   DATE OF ADMISSION:  12/08/2006  DATE OF DISCHARGE:  12/10/2006                               DISCHARGE SUMMARY   ADMISSION DIAGNOSES:  1. Decreased level of consciousness, questionable seizure.  2. Alcohol abuse.  3. Hypertension.  4. Manic depressive disorder.  5. History of pancreatitis.   DISCHARGE DIAGNOSES:  1. Alcohol abuse.  2. Hypertension.  3. Manic depression.  4. Pancreatitis.   CONSULTS:  Pramod P. Pearlean Brownie, MD, neurology.   PROCEDURE:  1. CT of the brain on December 08, 2006 which showed no evidence of      acute intracranial abnormality.  2. Chest x-ray on December 08, 2006, which showed no evidence of acute      cardiopulmonary disease.  3. CT angiography on December 08, 2006, which showed no evidence of      pulmonary emboli.  Peribronchial thickening without focal air space      disease or fatty liver.  4. MRI of the brain which showed premature atrophy with small vessel      disease.  No acute abnormality otherwise noted.   HISTORY OF PRESENT ILLNESS:  Patient is a 55 year old Caucasian male  with a long history of alcohol abuse and dependence with several  episodes of detoxification in the hospital, who presents to the  emergency room on the morning of admission with decreased level of  consciousness and questionable seizure activity for one hour prior to  admission, per his wife's report.  Patient was just discharged from the  hospital on November 17, 2006 after detoxification per Dr. Dub Mikes.  Patient  was supposed to follow up at the [facility].  At the time of  admission, the patient was asleep but easily arousable.  He had no  recollection of the morning's events.  He reports having had no alcohol  for several  days but says he consumes three 12 packs of beer just prior  to admission and had an alcohol level of 210.   Please see the HPI for past medical history, medications, and social  history as well as admission examination.   HOSPITAL COURSE:  1. The patient was seen in consultation by Dr. Delia Heady, who felt      the patient had alcohol intoxication, doubted underlying seizure.      Patient did have an MRI, as noted with no structural vein      abnormalities found.  EEG was performed.  The results are pending      at the time of this discharge dictation.  The patient remained      cogent during his hospital stay.  He was put on an Ativan      withdrawal protocol.  He reports he had some leg-crawling sensation      but had no hallucinations, no agitation.  He had a good diet.  With      the patient being stable with no signs of withdrawal, specifically,      no hallucinations, no delirium, he was thought to be able to be      discharged home.  2. Hypertension:  Patient's blood pressure was poorly controlled in      the hospital with pressures running from 158/113, 150/110, 130/101.   PLAN:  Patient is to continue his home medications.  Will increase his  Cozaar to 100 mg p.o. daily.   DISCHARGE EXAMINATION:  VITAL SIGNS:  Temperature 97.5, blood pressure  130/101.  Heart rate was 80.  Respirations were 14.  O2 sats 98% on room  air.  GENERAL APPEARANCE:  This is a thin gentleman in no acute distress.  CHEST:  Patient is moving air well.  CARDIOVASCULAR:  Radial pulses 2+.  He had a regular rate and rhythm.  NEURO:  Patient is awake, alert and oriented to person, place, time, and  context.  His speech is clear.  There is no obvious tremor.  Patient's  mental status is stable.   FINAL LABORATORY:  Patient did have cardiac enzymes that cycled negative  x3.  Chem-7's were unremarkable except for a mildly depressed potassium  at 3.4.  Kidney fraction was normal with a creatinine of  0.74.  Blood  count was normal on admission with a platelet count of 130,000.  INR was  1.  Cardiac enzymes were 74, 77, and 82.  Troponin I was less than 0.01,  less than 0.01, and 0.02.  Calcium was normal at 9.5.  Urinalysis was  negative.  Alcohol level on admission was 210.   DISPOSITION:  Patient is discharged home.  He will continue on home  medications with the increase in Cozaar as noted, so he will be on  Catapres 0.1 mg b.i.d., Cozaar 100 mg daily, Prilosec 40 mg daily,  aspirin 81 mg daily, Zoloft 100 mg daily, trazodone 100 mg nightly,  Flonase nasal spray b.i.d.   DISPOSITION:  Patient is discharged home.  He is adamantly encouraged to  attend AA on a regular basis, including getting to a meeting tonight if  possible.   Patient's condition at the time of discharge dictation is stable,  improved.      Rosalyn Gess Norins, MD  Electronically Signed     MEN/MEDQ  D:  12/10/2006  T:  12/10/2006  Job:  [id number]   cc:   Geoffery Lyons, M.D.   Billie D. Jillyn Hidden, FNP  [address] [place], [place] [address]   Pramod P. Pearlean Brownie, MD  Fax: [phone]

## 2011-03-29 NOTE — H&P (Signed)
[name], [name] NO.:  [id number]   MEDICAL RECORD NO.:  [id number]          PATIENT TYPE:  EMS   LOCATION:  ED                           FACILITY:  [facility]   PHYSICIAN:  Corwin Levins, MD      DATE OF BIRTH:  11-12-1955   DATE OF ADMISSION:  12/08/2006  DATE OF DISCHARGE:                              HISTORY & PHYSICAL   CHIEF COMPLAINT:  Decreased level of consciousness and questionable  seizure-like activity for approximately 1 hour this morning per wife.   HISTORY OF PRESENT ILLNESS:  Mr. [name] is a 55 year old white male with  a long history of alcohol abuse and dependence and several episodes of  detoxification who is here in the emergency room this morning with the  above complaint per wife who had him brought to the ER.  Unfortunately,  the wife is not here now to relay further details.  The patient was just  recently discharged November 17, 2006 after detoxification per Dr. Dub Mikes.  He was supposed to follow up at [facility].  This morning on exam, he  is sleepy but easily arousable, remembers none of this morning's events.  He states he has had no alcohol for 3 weeks until the last 3 days.  He  consumed three 12-packs of beer.  No other complaints at this time.   PAST MEDICAL HISTORY:  1. Hypertension.  2. History of cardiomyopathy with ejection fraction 38% in 1999.  3. COPD/chronic smoking.  4. Recurrent pancreatitis.  5. History of elevated LFTs, presumed alcoholic hepatitis.  6. Manic depressive, not otherwise specified.  7. GERD.  8. 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  9. Allergies.  10.No history of seizure or other neurologic disorder.   ALLERGIES:  CODEINE.   CURRENT MEDICATIONS:  As of November 17, 2006:  1. Catapres 0.1 mg b.i.d.  2. Cozaar 50 mg p.o. daily.  3. Prilosec 40 mg p.o. daily.  4. Aspirin 81 mg p.o. daily.  5. Zoloft 100 mg p.o. daily.  6. Trazodone 100 mg q.h.s.  7. Flonase  b.i.d.   SOCIAL HISTORY:  Currently lives with wife.  Tobacco:  One pack per day.   FAMILY HISTORY:  Some with alcohol and cocaine abuse.   REVIEW OF SYSTEMS:  Otherwise noncontributory.   PHYSICAL EXAMINATION:  GENERAL:  He is sleepy but arousable as above.  There is no evidence of trauma, bruising or swelling.  VITAL SIGNS:  Afebrile, blood pressure 153/103, heart rate 86,  respirations 20, O2 saturation 94%.  HEENT:  Normocephalic, atraumatic.  Pharynx benign.  NECK:  Without lymph nodes.  No JVD or thyromegaly.  CHEST:  No rales or wheezing.  CARDIAC:  Regular rate and rhythm.  ABDOMEN:  Soft, positive bowel sounds, mild diffuse tenderness.  No  guarding or rebound.  EXTREMITIES:  No edema.  NEUROLOGIC:  Alert and oriented x3.  He is otherwise cooperative.  No  tremors or shakes.   LABORATORY DATA:  CPK-MB less than 1.  Troponin I less than 0.05.  LFTs  within normal limits.  INR 1, lipase 21, D-dimer slightly elevated at  0.66.  Alcohol 210.  Electrolytes within normal limits.  BUN 7,  creatinine 0.8, glucose 105, white blood cell count 6.9, hemoglobin  14.7.  Also of note, in December 2007, TSH normal.  Today's chest x-ray:  No acute findings.  Head CT:  No acute findings.  CT angiogram of the  chest:  No pulmonary embolus.  There is fatty liver, otherwise no acute  findings.   ASSESSMENT AND PLAN:  1. Decreased level of consciousness with jerking episode for 1 hour      this morning.  Questionable seizure versus other.  Workup to date      negative except for elevated alcohol levels.  He needs to be      admitted for further monitoring and evaluation and neurovascular      consult.  2. Alcohol abuse.  He is given Librium p.r.n.  Watch for DTs.  Start      multivitamin, thiamine, folate.  3. Hypertension.  Restart medications.  4. Manic depressive.  For inpatient evaluation, Dr. Jeanie Sewer.  5. Other medical problems as above.  Restart home medications.  6. Prophylaxis.   Continue the PPI and start Lovenox subcu daily.  7. Disposition.  Questionable for home when improved versus      psychiatric facility.      Corwin Levins, MD  Electronically Signed     JWJ/MEDQ  D:  12/08/2006  T:  12/08/2006  Job:  [id number]   cc:   Geoffery Lyons, M.D.   Billie D. Jillyn Hidden, FNP  [address] [place], [place] [address]

## 2011-03-29 NOTE — Assessment & Plan Note (Signed)
[facility]                         GASTROENTEROLOGY OFFICE NOTE   THURLOW, GALLAGA                        MRN:          [id number]  DATE:10/06/2006                            DOB:          October 19, 1956    REFERRING PHYSICIAN:  Arta Silence, MD   NEW PATIENT EVALUATION   PROBLEM:  Elevated liver enzymes.   HISTORY:  [name] is a pleasant 55 year old white male referred for  evaluation of probable alcoholic liver disease. He has a long history of  alcohol abuse dating back 26 years. At the time that he quit drinking,  about a week ago, he was drinking a 12-pack per day. He says he has  tried to stop drinking in the past, but has been unsuccessful. He says  one time he stopped for 4 days and one time for 8 days and one time did  stop for about 7 years. He was placed on Librium taper per primary care  and is currently on Librium 25 t.i.d., which is making him somewhat  sedated. However, he has been successful with no alcohol over the past  week and a half. He had also been having quite a bit of heartburn, which  has resolved on Prilosec 20 mg daily. He has no complaints of abdominal  pain. His appetite, according to his wife who accompanies him, is much  better and he has actually gained about 20 pounds over the past couple  of weeks. He has no complaint of dysphagia or odynophagia. He has been  having some loose stools and urgency, but no melena or hematochezia. He  is unable to be specific how long that had been a problem for.   He has never been told that he has any form of liver disease in the  past. There is no family history of liver disease that he is aware of.   CURRENT MEDICATIONS:  1. Librium 25 t.i.d.  2. Cozaar 50 daily.  3. Clonidine 0.1 b.i.d.  4. Aspirin 81 daily.  5. Prilosec 20 twice daily.  6. Zoloft 50 daily.  7. Flonase.   ALLERGIES:  CODEINE.   PAST MEDICAL HISTORY:  Pertinent for:  1. Hypertension.  2. Tobacco  abuse.  3. Chronic obstructive pulmonary disease.  4. Old records indicate a cardiomyopathy. Ejection fraction listed as      38% in 1999. He has not had any recent cardiac evaluation.  5. He has not had any prior surgeries.   LABORATORY DATA:  On September 23, 2006, WBC of 3.7, hemoglobin 14,  hematocrit of 41.8, platelet count of 90.  Sodium 133, glucose 102,  creatinine 1.  Total bilirubin 0.8, alk-phos 101, OT 462, PT of 270,  lipase was 54.   FAMILY HISTORY:  Again, negative for GI disease. He had a paternal  grandfather with history of alcoholism, maternal side of the family with  diabetes, paternal side of the family with congestive heart failure and  Alzheimer's.   SOCIAL HISTORY:  The patient is married x3 years. He is not employed. He  is a smoker. ETOH a 12-pack per day.  Denies any other alcohol use and  denies any current drug use. Previously had smoked marijuana on a  regular basis in past years.   REVIEW OF SYSTEMS:  Reviewed in its entirety and is unremarkable.   PHYSICAL EXAMINATION:  Well-developed, white male in no acute distress.  His responses are slow and he appears sedated, but appropriate. Height  is 5 feet, 11 inches, weight is 160, blood pressure is 112/78, pulse is  in the 60s.  HEENT: Nontraumatic, normocephalic. EOMI. PERLA. Sclerae anicteric.  NECK: Supple, without nodes.  CARDIOVASCULAR: Regular rate and rhythm with S1, S2. No murmur, rub or  gallop.  PULMONARY: Clear, few scattered rhonchi.  ABDOMEN: Soft and nontender. There is no appreciable fluid wave or  ascites. No palpable mass or hepatosplenomegaly. Bowel sounds are  active.  RECTAL: Not done today.  EXTREMITIES: Trace edema in the ankles.  SKIN: He does not have any evidence of palmar erythema or other skin  changes of chronic liver disease.  NEURO: Is grossly nonfocal, though sedated secondary to Librium. There  is no asterixis.   IMPRESSION:  1. A 55 year old white male alcoholic with  transaminitis and      thrombocytopenia, rule out alcoholic hepatitis versus alcoholic      hepatitis with underlying cirrhosis.  2. Gastroesophageal reflux disease.   PLAN:  1. Continue Librium taper as he is doing.  2. Continue Prilosec 20 mg p.o. daily.  3. Add a multivitamin daily.  4. Repeat CMET today and also will obtain hepatitis B and C      serologies, venous ammonia,      PT, PTT, alpha-fetoprotein and ferritin.  5. Return office visit in 3-4 weeks or sooner p.r.n.      Mike Gip, PA-C  Electronically Signed      Iva Boop, MD,FACG  Electronically Signed   AE/MedQ  DD: 10/06/2006  DT: 10/06/2006  Job #: [id number]   cc:   Arta Silence, MD  Everrett Coombe

## 2011-08-01 LAB — BASIC METABOLIC PANEL
BUN: 7
BUN: 9
CO2: 25
CO2: 27
CO2: 28
CO2: 29
Calcium: 8.7
Calcium: 8.9
Calcium: 9
Chloride: 103
Chloride: 98
Chloride: 99
Creatinine, Ser: 0.71
Creatinine, Ser: 0.8
Creatinine, Ser: 0.82
GFR calc Af Amer: >60
GFR calc Af Amer: >60
GFR calc non Af Amer: >60
Glucose, Bld: 113 — ABNORMAL HIGH
Glucose, Bld: 113 — ABNORMAL HIGH
Potassium: 4.2
Potassium: 4.7
Sodium: 132 — ABNORMAL LOW

## 2011-08-01 LAB — COMPREHENSIVE METABOLIC PANEL
ALT: 19
ALT: 33
AST: 25
AST: 43 — ABNORMAL HIGH
Albumin: 2.8 — ABNORMAL LOW
Albumin: 3.8
Alkaline Phosphatase: 80
Alkaline Phosphatase: 91
BUN: 6
BUN: 8
CO2: 27
CO2: 29
Calcium: 9
Chloride: 94 — ABNORMAL LOW
Chloride: 98
GFR calc Af Amer: >60
GFR calc Af Amer: >60
GFR calc non Af Amer: >60
GFR calc non Af Amer: >60
Glucose, Bld: 104 — ABNORMAL HIGH
Glucose, Bld: 93
Potassium: 2.8 — ABNORMAL LOW
Potassium: 3.5
Sodium: 131 — ABNORMAL LOW
Total Bilirubin: 0.7
Total Bilirubin: 1.2
Total Bilirubin: 1.3 — ABNORMAL HIGH
Total Protein: 5.7 — ABNORMAL LOW
Total Protein: 7.1

## 2011-08-01 LAB — CBC
HCT: 35.1 — ABNORMAL LOW
Hemoglobin: 12.3 — ABNORMAL LOW
Hemoglobin: 12.7 — ABNORMAL LOW
Hemoglobin: 13.6
Hemoglobin: 16.7
MCHC: 35.3
MCHC: 35.6
MCV: 94.2
MCV: 94.7
MCV: 94.7
MCV: 95.3
Platelets: 182
Platelets: 91 — ABNORMAL LOW
RBC: 3.64 — ABNORMAL LOW
RBC: 4.09 — ABNORMAL LOW
RDW: 13.8
RDW: 13.9
RDW: 14.1
WBC: 16.8 — ABNORMAL HIGH
WBC: 8.9

## 2011-08-01 LAB — DIFFERENTIAL
Basophils Absolute: 0
Basophils Relative: 0
Eosinophils Absolute: 0.3
Lymphocytes Relative: 3 — ABNORMAL LOW
Lymphs Abs: 0.6 — ABNORMAL LOW
Monocytes Relative: 14 — ABNORMAL HIGH
Neutro Abs: 4.4
Neutrophils Relative %: 66

## 2011-08-01 LAB — MAGNESIUM: Magnesium: 1.7

## 2011-08-01 LAB — PHOSPHORUS: Phosphorus: 3.7

## 2011-08-01 LAB — URINE CULTURE: Colony Count: NO GROWTH

## 2011-08-01 LAB — URINE MICROSCOPIC-ADD ON

## 2011-08-01 LAB — URINALYSIS, ROUTINE W REFLEX MICROSCOPIC
Glucose, UA: NEGATIVE
Ketones, ur: NEGATIVE
Leukocytes, UA: NEGATIVE
Protein, ur: 100 — AB

## 2011-08-01 LAB — LIPASE, BLOOD
Lipase: 194 — ABNORMAL HIGH
Lipase: 39
Lipase: 801 — ABNORMAL HIGH

## 2011-08-01 LAB — RAPID URINE DRUG SCREEN, HOSP PERFORMED
Benzodiazepines: POSITIVE — AB
Cocaine: NOT DETECTED
Opiates: POSITIVE — AB

## 2011-08-01 LAB — CULTURE, BLOOD (ROUTINE X 2): Culture: NO GROWTH

## 2011-08-02 LAB — CBC
HCT: 30.6 — ABNORMAL LOW
HCT: 30.8 — ABNORMAL LOW
HCT: 34.4 — ABNORMAL LOW
Hemoglobin: 10.8 — ABNORMAL LOW
Hemoglobin: 11.1 — ABNORMAL LOW
Hemoglobin: 12.2 — ABNORMAL LOW
MCHC: 35.4
MCHC: 35.6
MCHC: 35.9
MCV: 91.8
MCV: 92.2
MCV: 92.3
MCV: 92.7
Platelets: 117 — ABNORMAL LOW
Platelets: 75 — ABNORMAL LOW
Platelets: 77 — ABNORMAL LOW
Platelets: 92 — ABNORMAL LOW
RBC: 3.3 — ABNORMAL LOW
RBC: 3.36 — ABNORMAL LOW
RBC: 3.72 — ABNORMAL LOW
RBC: 5.15
RDW: 13.5
RDW: 13.6
RDW: 13.7
WBC: 19.4 — ABNORMAL HIGH
WBC: 4.9
WBC: 5
WBC: 6.7

## 2011-08-02 LAB — DIFFERENTIAL
Basophils Absolute: 0
Eosinophils Absolute: 0
Eosinophils Relative: 0
Lymphocytes Relative: 2 — ABNORMAL LOW
Monocytes Absolute: 0.7

## 2011-08-02 LAB — BASIC METABOLIC PANEL
BUN: 3 — ABNORMAL LOW
BUN: 3 — ABNORMAL LOW
BUN: 6
CO2: 27
CO2: 28
Calcium: 8.7
Calcium: 8.7
Chloride: 98
Chloride: 99
Creatinine, Ser: 0.77
Creatinine, Ser: 0.88
GFR calc Af Amer: >60
GFR calc non Af Amer: >60
Glucose, Bld: 121 — ABNORMAL HIGH
Glucose, Bld: 87
Glucose, Bld: 96
Potassium: 3.3 — ABNORMAL LOW
Potassium: 3.3 — ABNORMAL LOW
Sodium: 134 — ABNORMAL LOW

## 2011-08-02 LAB — LIPASE, BLOOD
Lipase: 265 — ABNORMAL HIGH
Lipase: 64 — ABNORMAL HIGH

## 2011-08-02 LAB — COMPREHENSIVE METABOLIC PANEL
ALT: 28
AST: 36
Albumin: 4.3
CO2: 25
Chloride: 95 — ABNORMAL LOW
Creatinine, Ser: 1.1
GFR calc Af Amer: >60
GFR calc non Af Amer: >60
Potassium: 5.4 — ABNORMAL HIGH
Sodium: 134 — ABNORMAL LOW
Total Bilirubin: 1.7 — ABNORMAL HIGH

## 2011-08-02 LAB — CULTURE, BLOOD (ROUTINE X 2)
Culture: NO GROWTH
Culture: NO GROWTH

## 2011-08-02 LAB — RAPID URINE DRUG SCREEN, HOSP PERFORMED
Amphetamines: NOT DETECTED
Barbiturates: NOT DETECTED
Benzodiazepines: NOT DETECTED
Cocaine: NOT DETECTED

## 2011-08-02 LAB — LIPID PANEL
Cholesterol: 125
HDL: 43
LDL Cholesterol: 58
Total CHOL/HDL Ratio: 2.9
Triglycerides: 119
VLDL: 24

## 2011-08-02 LAB — MAGNESIUM
Magnesium: 1.9
Magnesium: 1.9
Magnesium: 2

## 2011-08-02 LAB — ETHANOL: Alcohol, Ethyl (B): <5

## 2011-08-22 LAB — COMPREHENSIVE METABOLIC PANEL
AST: 32
Albumin: 4.2
Alkaline Phosphatase: 85
BUN: 9
Chloride: 104
GFR calc Af Amer: >60
Potassium: 4
Total Bilirubin: 1.2

## 2011-08-22 LAB — ETHANOL: Alcohol, Ethyl (B): 31 — ABNORMAL HIGH

## 2011-08-22 LAB — DIFFERENTIAL
Basophils Absolute: 0.3 — ABNORMAL HIGH
Basophils Relative: 3 — ABNORMAL HIGH
Eosinophils Absolute: 0
Eosinophils Relative: 0
Monocytes Absolute: 0.9 — ABNORMAL HIGH

## 2011-08-22 LAB — CBC
HCT: 42.6
Platelets: 136 — ABNORMAL LOW
WBC: 11.3 — ABNORMAL HIGH

## 2011-08-22 LAB — RAPID URINE DRUG SCREEN, HOSP PERFORMED: Benzodiazepines: NOT DETECTED

## 2011-08-23 LAB — CBC
HCT: 32.9 — ABNORMAL LOW
HCT: 41
HCT: 42.1
Hemoglobin: 11.6 — ABNORMAL LOW
Hemoglobin: 14.5
Hemoglobin: 14.7
MCHC: 34.9
MCHC: 35
MCHC: 35.2
MCV: 96
Platelets: 93 — ABNORMAL LOW
RBC: 3.44 — ABNORMAL LOW
RBC: 3.51 — ABNORMAL LOW
RBC: 4.29
RDW: 13.4
RDW: 13.9
WBC: 10.5

## 2011-08-23 LAB — COMPREHENSIVE METABOLIC PANEL
ALT: 16
AST: 23
Albumin: 3.1 — ABNORMAL LOW
CO2: 29
Calcium: 8.8
Creatinine, Ser: 0.68
GFR calc Af Amer: >60
GFR calc non Af Amer: >60
Sodium: 138
Total Protein: 5.5 — ABNORMAL LOW

## 2011-08-23 LAB — HEPATIC FUNCTION PANEL
ALT: 21
AST: 29
Alkaline Phosphatase: 90
Bilirubin, Direct: 0.3
Indirect Bilirubin: 1.2 — ABNORMAL HIGH

## 2011-08-23 LAB — URINALYSIS, ROUTINE W REFLEX MICROSCOPIC
Bilirubin Urine: NEGATIVE
Glucose, UA: NEGATIVE
Hgb urine dipstick: NEGATIVE
Protein, ur: NEGATIVE
Specific Gravity, Urine: 1.013
Urobilinogen, UA: 0.2

## 2011-08-23 LAB — BASIC METABOLIC PANEL
CO2: 29
Calcium: 9.1
GFR calc Af Amer: >60
Glucose, Bld: 105 — ABNORMAL HIGH
Potassium: 3.4 — ABNORMAL LOW
Sodium: 141

## 2011-08-23 LAB — CALCIUM: Calcium: 9.2

## 2011-08-23 LAB — I-STAT 8, (EC8 V) (CONVERTED LAB)
Acid-base deficit: 1
BUN: 6
Bicarbonate: 23.5
Chloride: 101
Glucose, Bld: 100 — ABNORMAL HIGH
Hemoglobin: 15.6
Operator id: 279831
Sodium: 132 — ABNORMAL LOW
TCO2: 26
pCO2, Ven: 43.9 — ABNORMAL LOW
pH, Ven: 7.363 — ABNORMAL HIGH

## 2011-08-23 LAB — DIFFERENTIAL
Basophils Absolute: 0
Basophils Relative: 0
Eosinophils Relative: 1
Eosinophils Relative: 1
Lymphocytes Relative: 12
Lymphocytes Relative: 9 — ABNORMAL LOW
Lymphs Abs: 0.9
Monocytes Absolute: 0.9 — ABNORMAL HIGH
Monocytes Absolute: 0.9 — ABNORMAL HIGH
Monocytes Relative: 8
Monocytes Relative: 9
Neutro Abs: 8.6 — ABNORMAL HIGH

## 2011-08-23 LAB — POCT I-STAT CREATININE
Creatinine, Ser: 0.8
Operator id: 279831

## 2011-08-23 LAB — MAGNESIUM: Magnesium: 1.8

## 2011-08-23 LAB — URINE MICROSCOPIC-ADD ON

## 2011-08-23 LAB — RAPID URINE DRUG SCREEN, HOSP PERFORMED
Barbiturates: NOT DETECTED
Benzodiazepines: NOT DETECTED

## 2011-08-23 LAB — AMYLASE: Amylase: 786 — ABNORMAL HIGH

## 2011-08-29 LAB — COMPREHENSIVE METABOLIC PANEL
ALT: 23
AST: 33
Albumin: 4.2
Alkaline Phosphatase: 84
Glucose, Bld: 111 — ABNORMAL HIGH
Potassium: 4.3
Total Protein: 7.2

## 2011-08-29 LAB — CBC
HCT: 42.8
MCHC: 34.3
MCV: 94.9
RBC: 4.52
WBC: 6

## 2011-08-29 LAB — DIFFERENTIAL
Basophils Relative: 0
Eosinophils Absolute: 0.1
Eosinophils Relative: 1
Lymphs Abs: 1.2
Monocytes Absolute: 0.6
Monocytes Relative: 10

## 2012-01-16 ENCOUNTER — Emergency Department: Payer: Self-pay | Admitting: Emergency Medicine

## 2014-03-05 ENCOUNTER — Emergency Department: Payer: Self-pay | Admitting: Emergency Medicine

## 2014-03-05 LAB — URINALYSIS, COMPLETE
BILIRUBIN, UR: NEGATIVE
GLUCOSE, UR: NEGATIVE mg/dL (ref 0–75)
KETONE: NEGATIVE
NITRITE: NEGATIVE
Ph: 5 (ref 4.5–8.0)
Protein: NEGATIVE
RBC,UR: NONE SEEN /HPF (ref 0–5)
Specific Gravity: 1.004 (ref 1.003–1.030)
Squamous Epithelial: 1
WBC UR: 18 /HPF (ref 0–5)

## 2014-03-05 LAB — CBC
HCT: 41.9 % (ref 40.0–52.0)
HGB: 13.8 g/dL (ref 13.0–18.0)
MCH: 32.8 pg (ref 26.0–34.0)
MCHC: 32.9 g/dL (ref 32.0–36.0)
MCV: 100 fL (ref 80–100)
Platelet: 74 10*3/uL — ABNORMAL LOW (ref 150–440)
RBC: 4.21 10*6/uL — ABNORMAL LOW (ref 4.40–5.90)
RDW: 14.7 % — ABNORMAL HIGH (ref 11.5–14.5)
WBC: 5.8 10*3/uL (ref 3.8–10.6)

## 2014-03-05 LAB — BASIC METABOLIC PANEL
Anion Gap: 8 (ref 7–16)
BUN: 9 mg/dL (ref 7–18)
Calcium, Total: 9.7 mg/dL (ref 8.5–10.1)
Chloride: 89 mmol/L — ABNORMAL LOW (ref 98–107)
Co2: 29 mmol/L (ref 21–32)
Creatinine: 1.46 mg/dL — ABNORMAL HIGH (ref 0.60–1.30)
EGFR (African American): >60
EGFR (Non-African Amer.): 53 — ABNORMAL LOW
GLUCOSE: 88 mg/dL (ref 65–99)
OSMOLALITY: 251 (ref 275–301)
POTASSIUM: 3.7 mmol/L (ref 3.5–5.1)
Sodium: 126 mmol/L — ABNORMAL LOW (ref 136–145)

## 2014-03-05 LAB — ETHANOL
ETHANOL %: 0.13 % — AB (ref 0.000–0.080)
Ethanol: 130 mg/dL

## 2018-08-24 ENCOUNTER — Encounter: Payer: Self-pay | Admitting: *Deleted

## 2018-08-24 ENCOUNTER — Other Ambulatory Visit: Payer: Self-pay

## 2018-08-24 ENCOUNTER — Inpatient Hospital Stay
Admission: EM | Admit: 2018-08-24 | Discharge: 2018-08-25 | DRG: 300 | Disposition: A | Discharge status: Other Acute Inpatient Hospital | Payer: Medicaid Other | Attending: Internal Medicine | Admitting: Internal Medicine

## 2018-08-24 ENCOUNTER — Emergency Department: Payer: Medicaid Other

## 2018-08-24 DIAGNOSIS — Q632 Ectopic kidney: Secondary | ICD-10-CM

## 2018-08-24 DIAGNOSIS — I700 Atherosclerosis of aorta: Secondary | ICD-10-CM

## 2018-08-24 DIAGNOSIS — F1010 Alcohol abuse, uncomplicated: Secondary | ICD-10-CM | POA: Diagnosis present

## 2018-08-24 DIAGNOSIS — R0602 Shortness of breath: Secondary | ICD-10-CM

## 2018-08-24 DIAGNOSIS — I7103 Dissection of thoracoabdominal aorta: Principal | ICD-10-CM | POA: Diagnosis present

## 2018-08-24 DIAGNOSIS — I161 Hypertensive emergency: Secondary | ICD-10-CM | POA: Diagnosis present

## 2018-08-24 DIAGNOSIS — R0789 Other chest pain: Secondary | ICD-10-CM | POA: Diagnosis not present

## 2018-08-24 DIAGNOSIS — I10 Essential (primary) hypertension: Secondary | ICD-10-CM

## 2018-08-24 DIAGNOSIS — R739 Hyperglycemia, unspecified: Secondary | ICD-10-CM | POA: Diagnosis present

## 2018-08-24 DIAGNOSIS — I719 Aortic aneurysm of unspecified site, without rupture: Secondary | ICD-10-CM

## 2018-08-24 DIAGNOSIS — F17200 Nicotine dependence, unspecified, uncomplicated: Secondary | ICD-10-CM | POA: Diagnosis present

## 2018-08-24 DIAGNOSIS — R110 Nausea: Secondary | ICD-10-CM | POA: Diagnosis not present

## 2018-08-24 DIAGNOSIS — I712 Thoracic aortic aneurysm, without rupture: Secondary | ICD-10-CM | POA: Diagnosis present

## 2018-08-24 DIAGNOSIS — E871 Hypo-osmolality and hyponatremia: Secondary | ICD-10-CM | POA: Diagnosis present

## 2018-08-24 DIAGNOSIS — R079 Chest pain, unspecified: Secondary | ICD-10-CM | POA: Diagnosis not present

## 2018-08-24 DIAGNOSIS — I7410 Embolism and thrombosis of unspecified parts of aorta: Secondary | ICD-10-CM | POA: Diagnosis present

## 2018-08-24 DIAGNOSIS — E878 Other disorders of electrolyte and fluid balance, not elsewhere classified: Secondary | ICD-10-CM | POA: Diagnosis present

## 2018-08-24 HISTORY — DX: Essential (primary) hypertension: I10

## 2018-08-24 LAB — BASIC METABOLIC PANEL
ANION GAP: 11 (ref 5–15)
BUN: 13 mg/dL (ref 6–20)
CHLORIDE: 95 mmol/L — AB (ref 98–111)
CO2: 26 mmol/L (ref 22–32)
Calcium: 9.3 mg/dL (ref 8.9–10.3)
Creatinine, Ser: 1.17 mg/dL (ref 0.61–1.24)
GFR calc Af Amer: >60 mL/min (ref >60)
GFR calc non Af Amer: >60 mL/min (ref >60)
GLUCOSE: 133 mg/dL — AB (ref 70–99)
Potassium: 3.9 mmol/L (ref 3.5–5.1)
Sodium: 132 mmol/L — ABNORMAL LOW (ref 135–145)

## 2018-08-24 LAB — CBC
HCT: 36.8 % — ABNORMAL LOW (ref 39.0–52.0)
Hemoglobin: 12.6 g/dL — ABNORMAL LOW (ref 13.0–17.0)
MCH: 32.9 pg (ref 26.0–34.0)
MCHC: 34.2 g/dL (ref 30.0–36.0)
MCV: 96.1 fL (ref 80.0–100.0)
PLATELETS: 130 10*3/uL — AB (ref 150–400)
RBC: 3.83 MIL/uL — AB (ref 4.22–5.81)
RDW: 14.4 % (ref 11.5–15.5)
WBC: 10.9 10*3/uL — ABNORMAL HIGH (ref 4.0–10.5)
nRBC: 0 % (ref 0.0–0.2)

## 2018-08-24 LAB — TROPONIN I: TROPONIN I: 0.04 ng/mL — AB (ref <0.03)

## 2018-08-24 MED ORDER — MORPHINE SULFATE (PF) 4 MG/ML IV SOLN
4.0000 mg | Freq: Once | INTRAVENOUS | Status: AC
  Administered 2018-08-25: 4 mg via INTRAVENOUS
  Filled 2018-08-24: qty 1

## 2018-08-24 MED ORDER — ONDANSETRON HCL 4 MG/2ML IJ SOLN
4.0000 mg | Freq: Once | INTRAMUSCULAR | Status: AC
  Administered 2018-08-25: 4 mg via INTRAVENOUS
  Filled 2018-08-24: qty 2

## 2018-08-24 NOTE — ED Provider Notes (Signed)
[facility] Emergency Department Provider Note ____________________________________________   First MD Initiated Contact with Patient 08/24/18 2213     (approximate)  I have reviewed the triage vital signs and the nursing notes.   HISTORY  Chief Complaint Chest Pain    HPI [name] is a 62 y.o. male with PMH as noted below who presents with chest pain, acute onset approximately 3 to 4 hours ago while the patient was watching TV, nonexertional, described as a pounding in his chest, nonradiating.  It is associated with some shortness of breath and nausea but no lightheadedness.  He denies any prior history of this chest pain.  The patient states that he has not taken blood pressure medication in approximately 15 years.  He denies any prior history of this pain.  No past medical history on file.  Patient Active Problem List   Diagnosis Date Noted  . LOW BACK PAIN, CHRONIC 11/30/2010  . DEPRESSION 03/18/2008  . PANCREATITIS, CHRONIC 03/18/2008  . RESTLESS LEG SYNDROME 06/18/2007  . PENILE DISCHARGE 06/18/2007  . ALCOHOLISM 01/30/2007  . TOBACCO ABUSE 01/30/2007  . HYPERTENSION 01/30/2007  . COPD 01/30/2007  . CARDIOMYOPATHY 05/30/1998     Prior to Admission medications   Not on File    Allergies Codeine phosphate  No family history on file.  Social History Social History   Tobacco Use  . Smoking status: Current Every Day Smoker  . Smokeless tobacco: Never Used  Substance Use Topics  . Alcohol use: Yes  . Drug use: Never    Review of Systems  Constitutional: No fever. Eyes: No redness. ENT: No neck pain. Cardiovascular: Positive for chest pain. Respiratory: Positive for shortness of breath. Gastrointestinal: Positive for nausea.  Genitourinary: Negative for flank pain.  Musculoskeletal: Negative for back pain. Skin: Negative for rash. Neurological: Negative for  headache.   ____________________________________________   PHYSICAL EXAM:  VITAL SIGNS: ED Triage Vitals  Enc Vitals Group     BP 08/24/18 2202 (!) 168/109     Pulse Rate 08/24/18 2202 99     Resp 08/24/18 2202 19     Temp 08/24/18 2202 98.6 F (37 C)     Temp Source 08/24/18 2205 Oral     SpO2 08/24/18 2202 94 %     Weight 08/24/18 2202 140 lb (63.5 kg)     Height 08/24/18 2202 5\' 11"  (1.803 m)     Head Circumference --      Peak Flow --      Pain Score 08/24/18 2202 1     Pain Loc --      Pain Edu? --      Excl. in GC? --     Constitutional: Alert and oriented.  Uncomfortable appearing but in no acute distress. Eyes: Conjunctivae are normal.  Head: Atraumatic. Nose: No congestion/rhinnorhea. Mouth/Throat: Mucous membranes are moist.   Neck: Normal range of motion.  Cardiovascular: Normal rate, regular rhythm. Grossly normal heart sounds.  Good peripheral circulation.  Chest wall nontender. Respiratory: Normal respiratory effort.  No retractions. Lungs CTAB. Gastrointestinal: Soft and nontender. No distention.  Genitourinary: No flank tenderness. Musculoskeletal: No lower extremity edema.  Extremities warm and well perfused.  Neurologic:  Normal speech and language. No gross focal neurologic deficits are appreciated.  Skin:  Skin is warm and dry. No rash noted. Psychiatric: Mood and affect are normal. Speech and behavior are normal.  ____________________________________________   LABS (all labs ordered are listed, but only abnormal results are  displayed)  Labs Reviewed  BASIC METABOLIC PANEL - Abnormal; Notable for the following components:      Result Value   Sodium 132 (*)    Chloride 95 (*)    Glucose, Bld 133 (*)    All other components within normal limits  CBC - Abnormal; Notable for the following components:   WBC 10.9 (*)    RBC 3.83 (*)    Hemoglobin 12.6 (*)    HCT 36.8 (*)    Platelets 130 (*)    All other components within normal limits   TROPONIN I - Abnormal; Notable for the following components:   Troponin I 0.04 (*)    All other components within normal limits  LIPASE, BLOOD  HEPATIC FUNCTION PANEL  FIBRIN DERIVATIVES D-DIMER ([facility] ONLY)   ____________________________________________  EKG  ED ECG REPORT I, Dionne Bucy, the attending physician, personally viewed and interpreted this ECG.  Date: 08/24/2018 EKG Time: 2201 Rate: 90 Rhythm: normal sinus rhythm QRS Axis: normal Intervals: normal ST/T Wave abnormalities: LVH with repolarization abnormality and lateral T wave inversions Narrative Interpretation: no evidence of acute ischemia; no significant change when compared to EKG of 03/05/2014  ____________________________________________  RADIOLOGY  CXR: No focal infiltrate or other acute abnormality  ____________________________________________   PROCEDURES  Procedure(s) performed: No  Procedures  Critical Care performed: No ____________________________________________   INITIAL IMPRESSION / ASSESSMENT AND PLAN / ED COURSE  Pertinent labs & imaging results that were available during my care of the patient were reviewed by me and considered in my medical decision making (see chart for details).  61 year old male with PMH as noted above including hypertension for which he is noncompliant with medications presents with somewhat atypical nonexertional chest pain acute onset this evening.  I reviewed the past medical records in Epic, however the patient has no recent past medical records here.  Although the pain is somewhat atypical given the patient's chronic and untreated hypertension and his overall uncomfortable appearance and borderline tachycardia I am concerned for ACS versus possible PE.  Given the description of the pain, I have a lower suspicion for aortic dissection or other vascular etiology although this is also on the differential.  We will obtain basic and cardiac labs, chest  x-ray, d-dimer, and reassess.  He also has a prior history of pancreatitis, so I added on a lipase and hepatic function panel.  ----------------------------------------- 12:00 AM on 08/25/2018 -----------------------------------------  The patient's troponin is minimally elevated.  I have no prior labs for comparison.  He is still pending the d-dimer.   I am signing the patient out to the oncoming physician Dr. York Cerise.  The plan will be that if the d-dimer is elevated, a CT will be obtained.  We will plan for a repeat troponin after 3 to 4 hours.  If the troponin is elevated or the patient continues to have significant pain he may require admission.  If his pain resolves and his work-up is negative he may be able to go home.  ____________________________________________   FINAL CLINICAL IMPRESSION(S) / ED DIAGNOSES  Final diagnoses:  Atypical chest pain      NEW MEDICATIONS STARTED DURING THIS VISIT:  New Prescriptions   No medications on file     Note:  This document was prepared using Dragon voice recognition software and may include unintentional dictation errors.     Dionne Bucy, MD 08/25/18 0001

## 2018-08-24 NOTE — ED Notes (Signed)
Pt brought in via ems from home with chest pain.  Pt reports pain in middle of chest.   Nausea today.  Hx htn.  No bp meds for 15 years.  1 ntg given by ems 324mg  asa and 4 mg zofran given by ems.  nsr on monitor.  Pt alert.  Iv in place

## 2018-08-24 NOTE — ED Triage Notes (Signed)
Pt brought in via ems from home with chest pain.  Sx for 3 hours.  Pt given 1 ntg en route.  Pt alert.

## 2018-08-24 NOTE — ED Notes (Signed)
Patient transported to X-ray 

## 2018-08-25 ENCOUNTER — Emergency Department: Payer: Medicaid Other

## 2018-08-25 ENCOUNTER — Encounter: Payer: Self-pay | Admitting: Radiology

## 2018-08-25 ENCOUNTER — Inpatient Hospital Stay
Admit: 2018-08-25 | Discharge: 2018-08-25 | Disposition: A | Discharge status: Home / Self Care | Payer: Medicaid Other | Attending: Internal Medicine | Admitting: Internal Medicine

## 2018-08-25 ENCOUNTER — Inpatient Hospital Stay (HOSPITAL_COMMUNITY)
Admission: AD | Admit: 2018-08-25 | Discharge: 2018-08-25 | DRG: 301 | Discharge status: Against Medical Advice | Payer: Medicaid Other | Source: Other Acute Inpatient Hospital | Attending: Vascular Surgery | Admitting: Vascular Surgery

## 2018-08-25 DIAGNOSIS — I7410 Embolism and thrombosis of unspecified parts of aorta: Secondary | ICD-10-CM | POA: Diagnosis present

## 2018-08-25 DIAGNOSIS — I719 Aortic aneurysm of unspecified site, without rupture: Secondary | ICD-10-CM

## 2018-08-25 DIAGNOSIS — I7100 Dissection of unspecified site of aorta: Secondary | ICD-10-CM | POA: Diagnosis present

## 2018-08-25 DIAGNOSIS — I700 Atherosclerosis of aorta: Secondary | ICD-10-CM | POA: Diagnosis not present

## 2018-08-25 DIAGNOSIS — I723 Aneurysm of iliac artery: Secondary | ICD-10-CM | POA: Diagnosis present

## 2018-08-25 DIAGNOSIS — I7101 Dissection of thoracic aorta: Principal | ICD-10-CM | POA: Diagnosis present

## 2018-08-25 DIAGNOSIS — F17210 Nicotine dependence, cigarettes, uncomplicated: Secondary | ICD-10-CM | POA: Diagnosis present

## 2018-08-25 DIAGNOSIS — I10 Essential (primary) hypertension: Secondary | ICD-10-CM | POA: Diagnosis present

## 2018-08-25 DIAGNOSIS — R739 Hyperglycemia, unspecified: Secondary | ICD-10-CM | POA: Diagnosis present

## 2018-08-25 DIAGNOSIS — I7789 Other specified disorders of arteries and arterioles: Secondary | ICD-10-CM | POA: Diagnosis not present

## 2018-08-25 DIAGNOSIS — E871 Hypo-osmolality and hyponatremia: Secondary | ICD-10-CM | POA: Diagnosis present

## 2018-08-25 DIAGNOSIS — Q632 Ectopic kidney: Secondary | ICD-10-CM | POA: Diagnosis not present

## 2018-08-25 DIAGNOSIS — E878 Other disorders of electrolyte and fluid balance, not elsewhere classified: Secondary | ICD-10-CM | POA: Diagnosis present

## 2018-08-25 DIAGNOSIS — I712 Thoracic aortic aneurysm, without rupture: Secondary | ICD-10-CM | POA: Diagnosis not present

## 2018-08-25 DIAGNOSIS — I161 Hypertensive emergency: Secondary | ICD-10-CM | POA: Diagnosis not present

## 2018-08-25 DIAGNOSIS — R079 Chest pain, unspecified: Secondary | ICD-10-CM | POA: Diagnosis not present

## 2018-08-25 DIAGNOSIS — Z885 Allergy status to narcotic agent status: Secondary | ICD-10-CM | POA: Diagnosis not present

## 2018-08-25 DIAGNOSIS — F17200 Nicotine dependence, unspecified, uncomplicated: Secondary | ICD-10-CM | POA: Diagnosis present

## 2018-08-25 DIAGNOSIS — F1010 Alcohol abuse, uncomplicated: Secondary | ICD-10-CM | POA: Diagnosis present

## 2018-08-25 DIAGNOSIS — I7103 Dissection of thoracoabdominal aorta: Secondary | ICD-10-CM | POA: Diagnosis present

## 2018-08-25 DIAGNOSIS — I160 Hypertensive urgency: Secondary | ICD-10-CM | POA: Diagnosis not present

## 2018-08-25 LAB — URINE DRUG SCREEN, QUALITATIVE (ARMC ONLY)
Amphetamines, Ur Screen: NOT DETECTED
Barbiturates, Ur Screen: NOT DETECTED
Benzodiazepine, Ur Scrn: NOT DETECTED
Cannabinoid 50 Ng, Ur ~~LOC~~: POSITIVE — AB
Cocaine Metabolite,Ur ~~LOC~~: NOT DETECTED
MDMA (Ecstasy)Ur Screen: NOT DETECTED
Methadone Scn, Ur: NOT DETECTED
Opiate, Ur Screen: POSITIVE — AB
Phencyclidine (PCP) Ur S: NOT DETECTED
Tricyclic, Ur Screen: POSITIVE — AB

## 2018-08-25 LAB — PHOSPHORUS: PHOSPHORUS: 3.4 mg/dL (ref 2.5–4.6)

## 2018-08-25 LAB — COMPREHENSIVE METABOLIC PANEL
ALBUMIN: 3.5 g/dL (ref 3.5–5.0)
ALT: 14 U/L (ref 0–44)
AST: 21 U/L (ref 15–41)
Alkaline Phosphatase: 75 U/L (ref 38–126)
Anion gap: 7 (ref 5–15)
BUN: 11 mg/dL (ref 6–20)
CO2: 25 mmol/L (ref 22–32)
CREATININE: 1.32 mg/dL — AB (ref 0.61–1.24)
Calcium: 8.9 mg/dL (ref 8.9–10.3)
Chloride: 101 mmol/L (ref 98–111)
GFR calc Af Amer: >60 mL/min (ref >60)
GFR, EST NON AFRICAN AMERICAN: 57 mL/min — AB (ref >60)
GLUCOSE: 122 mg/dL — AB (ref 70–99)
POTASSIUM: 4 mmol/L (ref 3.5–5.1)
SODIUM: 133 mmol/L — AB (ref 135–145)
Total Bilirubin: 0.9 mg/dL (ref 0.3–1.2)
Total Protein: 6.6 g/dL (ref 6.5–8.1)

## 2018-08-25 LAB — BASIC METABOLIC PANEL
Anion gap: 11 (ref 5–15)
BUN: 14 mg/dL (ref 6–20)
CHLORIDE: 98 mmol/L (ref 98–111)
CO2: 22 mmol/L (ref 22–32)
CREATININE: 1.31 mg/dL — AB (ref 0.61–1.24)
Calcium: 8.5 mg/dL — ABNORMAL LOW (ref 8.9–10.3)
GFR calc non Af Amer: 58 mL/min — ABNORMAL LOW (ref >60)
GLUCOSE: 124 mg/dL — AB (ref 70–99)
Potassium: 4.3 mmol/L (ref 3.5–5.1)
Sodium: 131 mmol/L — ABNORMAL LOW (ref 135–145)

## 2018-08-25 LAB — SURGICAL PCR SCREEN
MRSA, PCR: NEGATIVE
Staphylococcus aureus: NEGATIVE

## 2018-08-25 LAB — LIPID PANEL
CHOLESTEROL: 134 mg/dL (ref 0–200)
HDL: 56 mg/dL (ref >40)
LDL Cholesterol: 70 mg/dL (ref 0–99)
Total CHOL/HDL Ratio: 2.4 RATIO
Triglycerides: 39 mg/dL (ref <150)
VLDL: 8 mg/dL (ref 0–40)

## 2018-08-25 LAB — TROPONIN I
Troponin I: 0.03 ng/mL (ref <0.03)
Troponin I: 0.04 ng/mL (ref <0.03)

## 2018-08-25 LAB — ECHOCARDIOGRAM COMPLETE
HEIGHTINCHES: 71 in
Weight: 2240 oz

## 2018-08-25 LAB — CBC
HCT: 35.7 % — ABNORMAL LOW (ref 39.0–52.0)
HEMOGLOBIN: 11.9 g/dL — AB (ref 13.0–17.0)
MCH: 32.3 pg (ref 26.0–34.0)
MCHC: 33.3 g/dL (ref 30.0–36.0)
MCV: 97 fL (ref 80.0–100.0)
Platelets: 125 10*3/uL — ABNORMAL LOW (ref 150–400)
RBC: 3.68 MIL/uL — ABNORMAL LOW (ref 4.22–5.81)
RDW: 14.8 % (ref 11.5–15.5)
WBC: 6.9 10*3/uL (ref 4.0–10.5)
nRBC: 0 % (ref 0.0–0.2)

## 2018-08-25 LAB — APTT: APTT: 32 s (ref 24–36)

## 2018-08-25 LAB — PROTIME-INR
INR: 1.02
INR: 1.06
PROTHROMBIN TIME: 13.7 s (ref 11.4–15.2)
Prothrombin Time: 13.3 seconds (ref 11.4–15.2)

## 2018-08-25 LAB — LIPASE, BLOOD: Lipase: 16 U/L (ref 11–51)

## 2018-08-25 LAB — HEPATIC FUNCTION PANEL
ALBUMIN: 4.2 g/dL (ref 3.5–5.0)
ALK PHOS: 78 U/L (ref 38–126)
ALT: 14 U/L (ref 0–44)
AST: 23 U/L (ref 15–41)
BILIRUBIN DIRECT: 0.1 mg/dL (ref 0.0–0.2)
BILIRUBIN INDIRECT: 0.5 mg/dL (ref 0.3–0.9)
BILIRUBIN TOTAL: 0.6 mg/dL (ref 0.3–1.2)
TOTAL PROTEIN: 7.2 g/dL (ref 6.5–8.1)

## 2018-08-25 LAB — MRSA PCR SCREENING: MRSA BY PCR: NEGATIVE

## 2018-08-25 LAB — MAGNESIUM: MAGNESIUM: 1.5 mg/dL — AB (ref 1.7–2.4)

## 2018-08-25 LAB — HEMOGLOBIN A1C
Hgb A1c MFr Bld: 5.5 % (ref 4.8–5.6)
MEAN PLASMA GLUCOSE: 111.15 mg/dL

## 2018-08-25 LAB — GLUCOSE, CAPILLARY: GLUCOSE-CAPILLARY: 142 mg/dL — AB (ref 70–99)

## 2018-08-25 LAB — FIBRIN DERIVATIVES D-DIMER (ARMC ONLY): FIBRIN DERIVATIVES D-DIMER (ARMC): 1614.38 ng{FEU}/mL — AB (ref 0.00–499.00)

## 2018-08-25 MED ORDER — NICARDIPINE HCL IN NACL 20-0.86 MG/200ML-% IV SOLN
3.0000 mg/h | Freq: Once | INTRAVENOUS | Status: AC
  Administered 2018-08-25: 3 mg/h via INTRAVENOUS
  Filled 2018-08-25: qty 200

## 2018-08-25 MED ORDER — HYDRALAZINE HCL 20 MG/ML IJ SOLN: 5.0000 mg | INTRAMUSCULAR | Status: DC | PRN

## 2018-08-25 MED ORDER — ALUM & MAG HYDROXIDE-SIMETH 200-200-20 MG/5ML PO SUSP: 15.0000 mL | ORAL | Status: DC | PRN

## 2018-08-25 MED ORDER — SODIUM CHLORIDE 0.9 % IV SOLN
INTRAVENOUS | Status: DC
  Administered 2018-08-25: 04:00:00 via INTRAVENOUS

## 2018-08-25 MED ORDER — ONDANSETRON HCL 4 MG/2ML IJ SOLN
INTRAMUSCULAR | Status: AC
  Administered 2018-08-25: 4 mg
  Filled 2018-08-25: qty 2

## 2018-08-25 MED ORDER — SODIUM CHLORIDE 0.9 % IV BOLUS
1000.0000 mL | Freq: Once | INTRAVENOUS | Status: AC
  Administered 2018-08-25: 1000 mL via INTRAVENOUS

## 2018-08-25 MED ORDER — MAGNESIUM SULFATE 4 GM/100ML IV SOLN
4.0000 g | Freq: Once | INTRAVENOUS | Status: AC
  Administered 2018-08-25: 4 g via INTRAVENOUS
  Filled 2018-08-25: qty 100

## 2018-08-25 MED ORDER — LORAZEPAM 2 MG/ML IJ SOLN: 1.0000 mg | Freq: Four times a day (QID) | INTRAMUSCULAR | Status: DC | PRN

## 2018-08-25 MED ORDER — FAMOTIDINE 20 MG PO TABS
20.0000 mg | ORAL_TABLET | Freq: Two times a day (BID) | ORAL | Status: DC
  Administered 2018-08-25: 20 mg via ORAL
  Filled 2018-08-25: qty 1

## 2018-08-25 MED ORDER — LABETALOL HCL 5 MG/ML IV SOLN
10.0000 mg | Freq: Once | INTRAVENOUS | Status: AC
  Administered 2018-08-25: 10 mg via INTRAVENOUS
  Filled 2018-08-25: qty 4

## 2018-08-25 MED ORDER — ACETAMINOPHEN 325 MG PO TABS: 325.0000 mg | ORAL_TABLET | ORAL | Status: DC | PRN

## 2018-08-25 MED ORDER — IOHEXOL 350 MG/ML SOLN
75.0000 mL | Freq: Once | INTRAVENOUS | Status: AC | PRN
  Administered 2018-08-25: 75 mL via INTRAVENOUS

## 2018-08-25 MED ORDER — HYDRALAZINE HCL 50 MG PO TABS
25.0000 mg | ORAL_TABLET | Freq: Three times a day (TID) | ORAL | Status: DC
  Administered 2018-08-25: 25 mg via ORAL
  Filled 2018-08-25: qty 1

## 2018-08-25 MED ORDER — THIAMINE HCL 100 MG/ML IJ SOLN: 100.0000 mg | Freq: Every day | INTRAMUSCULAR | Status: DC

## 2018-08-25 MED ORDER — MORPHINE SULFATE (PF) 2 MG/ML IV SOLN: 2.0000 mg | INTRAVENOUS | Status: DC | PRN

## 2018-08-25 MED ORDER — ESMOLOL BOLUS VIA INFUSION
500.0000 ug/kg | Freq: Once | INTRAVENOUS | Status: AC
  Administered 2018-08-25: 31750 ug via INTRAVENOUS
  Filled 2018-08-25: qty 32000

## 2018-08-25 MED ORDER — ESMOLOL HCL-SODIUM CHLORIDE 2000 MG/100ML IV SOLN
25.0000 ug/kg/min | INTRAVENOUS | Status: DC
  Administered 2018-08-25: 200 ug/kg/min via INTRAVENOUS
  Filled 2018-08-25: qty 100

## 2018-08-25 MED ORDER — MIDAZOLAM HCL 2 MG/2ML IJ SOLN: 1.0000 mg | INTRAMUSCULAR | Status: DC | PRN

## 2018-08-25 MED ORDER — KCL IN DEXTROSE-NACL 20-5-0.45 MEQ/L-%-% IV SOLN
INTRAVENOUS | Status: DC
  Administered 2018-08-25: 21:00:00 via INTRAVENOUS
  Filled 2018-08-25: qty 1000

## 2018-08-25 MED ORDER — ESMOLOL HCL-SODIUM CHLORIDE 2000 MG/100ML IV SOLN
25.0000 ug/kg/min | Freq: Once | INTRAVENOUS | Status: AC
  Administered 2018-08-25: 25 ug/kg/min via INTRAVENOUS
  Filled 2018-08-25: qty 100

## 2018-08-25 MED ORDER — FOLIC ACID 1 MG PO TABS
1.0000 mg | ORAL_TABLET | Freq: Every day | ORAL | Status: DC
  Administered 2018-08-25: 1 mg via ORAL
  Filled 2018-08-25: qty 1

## 2018-08-25 MED ORDER — ESMOLOL HCL-SODIUM CHLORIDE 2000 MG/100ML IV SOLN: 25.0000 ug/kg/min | INTRAVENOUS | Status: DC

## 2018-08-25 MED ORDER — FAMOTIDINE IN NACL 20-0.9 MG/50ML-% IV SOLN: 20.0000 mg | Freq: Two times a day (BID) | INTRAVENOUS | Status: DC

## 2018-08-25 MED ORDER — ASPIRIN 81 MG PO CHEW
81.0000 mg | CHEWABLE_TABLET | Freq: Every day | ORAL | Status: DC
  Administered 2018-08-25: 81 mg via ORAL
  Filled 2018-08-25: qty 1

## 2018-08-25 MED ORDER — GUAIFENESIN-DM 100-10 MG/5ML PO SYRP: 15.0000 mL | ORAL_SOLUTION | ORAL | Status: DC | PRN

## 2018-08-25 MED ORDER — LORAZEPAM 2 MG/ML IJ SOLN
1.0000 mg | INTRAMUSCULAR | Status: DC | PRN
  Administered 2018-08-25: 2 mg via INTRAVENOUS
  Filled 2018-08-25: qty 1

## 2018-08-25 MED ORDER — LORAZEPAM 1 MG PO TABS: 1.0000 mg | ORAL_TABLET | Freq: Four times a day (QID) | ORAL | Status: DC | PRN

## 2018-08-25 MED ORDER — DEXTROSE-NACL 5-0.45 % IV SOLN
INTRAVENOUS | Status: DC
  Administered 2018-08-25: 15:00:00 via INTRAVENOUS

## 2018-08-25 MED ORDER — IOPAMIDOL (ISOVUE-370) INJECTION 76%
100.0000 mL | Freq: Once | INTRAVENOUS | Status: AC | PRN
  Administered 2018-08-25: 100 mL via INTRAVENOUS

## 2018-08-25 MED ORDER — ONDANSETRON HCL 4 MG/2ML IJ SOLN: 4.0000 mg | Freq: Four times a day (QID) | INTRAMUSCULAR | Status: DC | PRN

## 2018-08-25 MED ORDER — NICARDIPINE HCL IN NACL 20-0.86 MG/200ML-% IV SOLN
3.0000 mg/h | INTRAVENOUS | Status: DC
  Administered 2018-08-25: 5 mg/h via INTRAVENOUS
  Filled 2018-08-25: qty 200

## 2018-08-25 MED ORDER — ADULT MULTIVITAMIN W/MINERALS CH
1.0000 | ORAL_TABLET | Freq: Every day | ORAL | Status: DC
  Administered 2018-08-25: 1 via ORAL
  Filled 2018-08-25: qty 1

## 2018-08-25 MED ORDER — NICOTINE 21 MG/24HR TD PT24
21.0000 mg | MEDICATED_PATCH | Freq: Every day | TRANSDERMAL | Status: DC
  Filled 2018-08-25: qty 1

## 2018-08-25 MED ORDER — LABETALOL HCL 5 MG/ML IV SOLN: 10.0000 mg | INTRAVENOUS | Status: DC | PRN

## 2018-08-25 MED ORDER — LORAZEPAM 1 MG PO TABS: 1.0000 mg | ORAL_TABLET | Freq: Four times a day (QID) | ORAL | 0 refills | Status: DC | PRN

## 2018-08-25 MED ORDER — NICARDIPINE HCL IN NACL 20-0.86 MG/200ML-% IV SOLN: 3.0000 mg/h | INTRAVENOUS | Status: DC

## 2018-08-25 MED ORDER — ACETAMINOPHEN 325 MG PO TABS
650.0000 mg | ORAL_TABLET | Freq: Four times a day (QID) | ORAL | Status: DC | PRN
  Administered 2018-08-25: 650 mg via ORAL
  Filled 2018-08-25 (×2): qty 2

## 2018-08-25 MED ORDER — POTASSIUM CHLORIDE CRYS ER 20 MEQ PO TBCR
20.0000 meq | EXTENDED_RELEASE_TABLET | Freq: Once | ORAL | Status: DC
  Filled 2018-08-25: qty 1

## 2018-08-25 MED ORDER — THIAMINE HCL 100 MG/ML IJ SOLN
100.0000 mg | Freq: Every day | INTRAMUSCULAR | Status: DC
  Filled 2018-08-25: qty 2

## 2018-08-25 MED ORDER — VITAMIN B-1 100 MG PO TABS
100.0000 mg | ORAL_TABLET | Freq: Every day | ORAL | Status: DC
  Administered 2018-08-25: 100 mg via ORAL
  Filled 2018-08-25: qty 1

## 2018-08-25 MED ORDER — LABETALOL HCL 100 MG PO TABS
100.0000 mg | ORAL_TABLET | Freq: Two times a day (BID) | ORAL | Status: DC
  Administered 2018-08-25: 100 mg via ORAL
  Filled 2018-08-25 (×2): qty 1

## 2018-08-25 MED ORDER — ACETAMINOPHEN 325 MG RE SUPP: 325.0000 mg | RECTAL | Status: DC | PRN

## 2018-08-25 MED ORDER — ENOXAPARIN SODIUM 40 MG/0.4ML ~~LOC~~ SOLN
40.0000 mg | SUBCUTANEOUS | Status: DC
  Administered 2018-08-25: 40 mg via SUBCUTANEOUS
  Filled 2018-08-25: qty 0.4

## 2018-08-25 MED ORDER — FAMOTIDINE 20 MG PO TABS: 20.0000 mg | ORAL_TABLET | Freq: Two times a day (BID) | ORAL | Status: DC

## 2018-08-25 MED ORDER — ASPIRIN 81 MG PO CHEW: 81.0000 mg | CHEWABLE_TABLET | Freq: Every day | ORAL | Status: DC

## 2018-08-25 MED ORDER — PANTOPRAZOLE SODIUM 40 MG PO TBEC
40.0000 mg | DELAYED_RELEASE_TABLET | Freq: Every day | ORAL | Status: DC
  Administered 2018-08-25: 40 mg via ORAL
  Filled 2018-08-25: qty 1

## 2018-08-25 MED ORDER — NICOTINE 21 MG/24HR TD PT24: 21.0000 mg | MEDICATED_PATCH | Freq: Every day | TRANSDERMAL | 0 refills | Status: DC

## 2018-08-25 MED ORDER — DEXTROSE-NACL 5-0.45 % IV SOLN: 50.0000 mL/h | INTRAVENOUS | Status: DC

## 2018-08-25 MED ORDER — ADULT MULTIVITAMIN W/MINERALS CH: 1.0000 | ORAL_TABLET | Freq: Every day | ORAL | Status: DC

## 2018-08-25 MED ORDER — ESMOLOL HCL-SODIUM CHLORIDE 2000 MG/100ML IV SOLN
25.0000 ug/kg/min | INTRAVENOUS | Status: DC
  Administered 2018-08-25: 150 ug/kg/min via INTRAVENOUS
  Filled 2018-08-25: qty 100

## 2018-08-25 MED ORDER — PHENOL 1.4 % MT LIQD: 1.0000 | OROMUCOSAL | Status: DC | PRN

## 2018-08-25 MED ORDER — ESMOLOL HCL-SODIUM CHLORIDE 2000 MG/100ML IV SOLN
25.0000 ug/kg/min | INTRAVENOUS | Status: DC
  Administered 2018-08-25: 50 ug/kg/min via INTRAVENOUS
  Filled 2018-08-25 (×3): qty 100

## 2018-08-25 MED ORDER — METOPROLOL TARTRATE 5 MG/5ML IV SOLN: 2.0000 mg | INTRAVENOUS | Status: DC | PRN

## 2018-08-25 NOTE — Progress Notes (Signed)
Patient ID: [name], male   DOB: [birth year], 62 y.o.   MRN: [id number]  Chief Complaint  Patient presents with  . Chest Pain    Referred By Dr. Sherryll Burger (Intensivist) Reason for Referral Aortic Penetrating Ulcer with Intramural Hematoma    HPI Location, Quality, Duration, Severity, Timing, Context, Modifying Factors, Associated Signs and Symptoms.  [name] is a 62 y.o. male.  His problems began late yesterday afternoon early evening with the onset of substernal chest pain which over the course of several hours radiated down into his abdomen.  He states that the pain lasted several more hours and he eventually came to the emergency room where he had a CT scan done.  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  There is no evidence of dissection or extravasation.  Patient states that he is now pain-free since he has been admitted to the hospital and begun on medication.  He is currently receiving labetalol.  His blood pressure is about 100 systolic with a heart rate in the 80s to 90s.  He is pain-free at this time and awake alert and oriented.  He describes the pain is pulsatile and very severe.  It was not associated with any other symptoms.  He is never had any known history of cardiac disease.  He has had no prior stroke or TIA myocardial infarction.  He is a smoker.  He does not routinely seek medical attention.  His only surgical intervention was that of a hypospadias repair as a child.  The details of that are unclear but he did see a urologist for a surgical procedure when he was 62 years of age.  He states he is allergic to codeine which causes nightmares.  He does not take any medications at home.   Past Medical History:  Diagnosis Date  . Hypertension     History reviewed. No pertinent surgical history.  History reviewed. No pertinent family  history.  Social History Social History   Tobacco Use  . Smoking status: Current Every Day Smoker  . Smokeless tobacco: Never Used  Substance Use Topics  . Alcohol use: Yes  . Drug use: Never    Allergies  Allergen Reactions  . Codeine Phosphate     REACTION: unspecified    Current Facility-Administered Medications  Medication Dose Route Frequency Provider Last Rate Last Dose  . aspirin chewable tablet 81 mg  81 mg Oral Daily Barbaraann Rondo, MD   81 mg at 08/25/18 0957  . famotidine (PEPCID) tablet 20 mg  20 mg Oral BID Roseanne Reno, MD   20 mg at 08/25/18 1234  . folic acid (FOLVITE) tablet 1 mg  1 mg Oral Daily Eugenie Norrie, NP   1 mg at 08/25/18 0957  . hydrALAZINE (APRESOLINE) tablet 25 mg  25 mg Oral Q8H Katha Hamming, MD      . labetalol (NORMODYNE) tablet 100 mg  100 mg Oral BID Katha Hamming, MD      . LORazepam (ATIVAN) tablet 1 mg  1 mg Oral Q6H PRN Eugenie Norrie, NP       Or  . LORazepam (ATIVAN) injection 1 mg  1 mg Intravenous Q6H PRN Eugenie Norrie, NP      . magnesium sulfate IVPB 4 g 100 mL  4 g Intravenous Once Roseanne Reno, MD 45 mL/hr at 08/25/18 1243  Edilia [id number] GrandPeterson Rehabilitation Hospita32lceBelarusEttNSpecialty Surgical Centerrth CaroLaveda AbbetalCorali79e CaIllinoisIndianan [id number] Linseyerwin-WilliWrightsvilMarland KitchenleFlorida/South GC Cloudbeiam B C 2AncPatsy [id number] AG>us34Laveda AbbeJohnson Memorial Hospitaltta GrandchildInsurance underwriter4734Edilia Bo40Kandice Hams Caprice KluverMarland KitchenBraddyvilleThe Sherwin-WilliamsRenaissance Hospital Terrell36 Clance BollRinaldo CloudGolden Valley Memorial Hospital12Prescott ParmaPamella [phone] ApleyAzerbaijan [phone] BaltimoreDavid Stall Ancil [id number] [phone] HoarIllinoisIndiana64Coralie CarpenUrology Surgery Center Of Savannah LlLPBelarus15Laveda AbbeAdventist GlenoaksEtta GrandchildInsurance underwriter546Edilia Bo75Kandice Hams Caprice KluverMarland KitchenMindoroThe Sherwin-WilliamsWythe County Community Hospital77 Clance BollRinaldo CloudUhhs Bedford [facility] Center62Prescott ParmaPamella [phone] ApleyAzerbaijan [phone] BaltimoreDavid Stall Ancil [id number] [phone] HoarIllinoisIndiana56Coralie CarpenWellstone Regional HospitalBelarus

## 2018-08-25 NOTE — ED Notes (Signed)
Pt reports pain in abdomen.  meds infusing.  Pt alert. Sinus at 102 on monitor.

## 2018-08-25 NOTE — Consult Note (Signed)
[address]       [place] [address]             [phone]          CARDIOTHORACIC SURGERY CONSULTATION REPORT  PCP is Patient, No Pcp Per Referring Provider is Hulda Marin, MD  Reason for consultation:  Intramural hematoma of descending thoracic aorta  HPI:  Patient is a 62 year old male with history of hypertension not currently on medical therapy, tobacco abuse, and alcohol abuse who has been transferred from [facility] for management of acute type B intramural hematoma involving the descending thoracic aorta.  Patient has a known history of hypertension but has not seen a physician nor been on medical therapy for more than 15 years.  He states that he was in his usual state of health until yesterday when he developed relatively sudden onset of substernal chest pain radiating to the upper abdomen.  He describes the pain as a pressure-like sensation that seem to wax and wane with his heartbeat.  Pain persisted over the course of the day until he ultimately presented to [facility].  EKGs revealed sinus rhythm without acute ST segment changes.  Troponin levels were 0.04 and remained flat.  Chest CT scan was performed to rule out pulmonary embolus and demonstrated the presence of intramural hematoma involving the descending thoracic aorta.  Follow-up CT angiogram of the chest and abdomen confirmed the presence of an intramural hematoma beginning distal to the left subclavian and extending through the majority of the descending thoracic aorta.  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  There was no sign of aortic dissection.  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  The patient was seen in consultation by Dr. Thelma Barge who recommended transfer to a tertiary care facility.  Patient states that his chest pain resolved after he was started on pain  medications and medications to control his blood pressure at [facility].  He states that he has not had any pain in more than 12 hours.  He denies any shortness of breath.  He has never had similar pain in the past.  He denies any abdominal discomfort.  Patient is married and lives with his wife in [place].  He states that he is not currently employed.  He smokes between one half a pack and 1 pack of cigarettes daily.  He drinks alcohol every day and has done so for many years.  Past Medical History:  Diagnosis Date  . Hypertension     No past surgical history on file.  No family history on file.  Social History   Socioeconomic History  . Marital status: Married    Spouse name: Not on file  . Number of children: Not on file  . Years of education: Not on file  . Highest education level: Not on file  Occupational History  . Not on file  Social Needs  . Financial resource strain: Not on file  . Food insecurity:    Worry: Not on file    Inability: Not on file  . Transportation needs:    Medical: Not on file    Non-medical: Not on file  Tobacco Use  . Smoking status: Current Every Day Smoker  . Smokeless tobacco: Never Used  Substance and Sexual Activity  . Alcohol use: Yes  . Drug use: Never  . Sexual activity:  Not on file  Lifestyle  . Physical activity:    Days per week: Not on file    Minutes per session: Not on file  . Stress: Not on file  Relationships  . Social connections:    Talks on phone: Not on file    Gets together: Not on file    Attends religious service: Not on file    Active member of club or organization: Not on file    Attends meetings of clubs or organizations: Not on file    Relationship status: Not on file  . Intimate partner violence:    Fear of current or ex partner: Not on file    Emotionally abused: Not on file    Physically abused: Not on file    Forced sexual activity: Not on file  Other Topics Concern  . Not on  file  Social History Narrative  . Not on file    Prior to Admission medications   Medication Sig Start Date End Date Taking? Authorizing Provider  aspirin 81 MG chewable tablet Chew 1 tablet (81 mg total) by mouth daily. 08/26/18   Roseanne Reno, MD  Dextrose-Sodium Chloride (DEXTROSE 5 % AND 0.45% NACL) infusion Inject 50 mL/hr into the vein continuous. 08/25/18   Roseanne Reno, MD  esmolol (BREVIBLOC) 2000 mg / 100 mL infusion Inject 1.5875-19.05 mg/min into the vein continuous. 08/25/18   Roseanne Reno, MD  famotidine (PEPCID) 20-0.9 MG/50ML-% Inject 50 mLs (20 mg total) into the vein every 12 (twelve) hours. 08/25/18   Roseanne Reno, MD  LORazepam (ATIVAN) 1 MG tablet Take 1 tablet (1 mg total) by mouth every 6 (six) hours as needed (CIWA-AR > 8  -OR-  withdrawal symptoms:  anxiety, agitation, insomnia, diaphoresis, nausea, vomiting, tremors, tachycardia, or hypertension.). 08/25/18   Roseanne Reno, MD  Multiple Vitamin (MULTIVITAMIN WITH MINERALS) TABS tablet Take 1 tablet by mouth daily. 08/26/18   Roseanne Reno, MD  niCARdipine in saline (CARDENE-IV) 20-0.86 MG/200ML-% SOLN Inject 3-15 mg/hr into the vein continuous. 08/25/18   Roseanne Reno, MD  nicotine (NICODERM CQ - DOSED IN MG/24 HOURS) 21 mg/24hr patch Place 1 patch (21 mg total) onto the skin daily. 08/25/18   Roseanne Reno, MD  thiamine (B-1) 100 MG/ML injection Inject 1 mL (100 mg total) into the vein daily. 08/26/18   Roseanne Reno, MD    No current facility-administered medications for this encounter.     Allergies  Allergen Reactions  . Codeine Phosphate     REACTION: unspecified      Review of Systems:   General:  normal appetite, normal energy, no weight gain, no weight loss, no fever  Cardiac:  no chest pain with exertion, + chest pain at rest, no SOB with exertion, no resting SOB, no PND, no orthopnea, no palpitations, no arrhythmia, no atrial fibrillation, no LE edema, no dizzy spells, no syncope  Respiratory:  no shortness of  breath, no home oxygen, no productive cough, no dry cough, no bronchitis, no wheezing, no hemoptysis, no asthma, no pain with inspiration or cough, no sleep apnea, no CPAP at night  GI:   no difficulty swallowing, no reflux, no frequent heartburn, no hiatal hernia, no abdominal pain, no constipation, no diarrhea, no hematochezia, no hematemesis, no melena  GU:   no dysuria,  no frequency, no urinary tract infection, no hematuria, no enlarged prostate, no kidney stones, no kidney disease  Vascular:  no pain suggestive of claudication, no pain in feet, no leg cramps, no varicose  veins, no DVT, no non-healing foot ulcer  Neuro:   no stroke, no TIA's, no seizures, no headaches, no temporary blindness one eye,  no slurred speech, no peripheral neuropathy, no chronic pain, no instability of gait, no memory/cognitive dysfunction  Musculoskeletal: no arthritis , no joint swelling, no myalgias, no difficulty walking, normal mobility   Skin:   no rash, no itching, no skin infections, no pressure sores or ulcerations  Psych:   no anxiety, no depression, no nervousness, no unusual recent stress  Eyes:   no blurry vision, no floaters, no recent vision changes, + wears glasses or contacts  ENT:   no hearing loss, + loose or painful teeth, no dentures, last saw dentist many years ago  Hematologic:  no easy bruising, no abnormal bleeding, no clotting disorder, no frequent epistaxis  Endocrine:  no diabetes, does not check CBG's at home     Physical Exam:   There were no vitals taken for this visit.  General:  Thin, malnourished in appearance  HEENT:  Unremarkable except poor dentition  Neck:   no JVD, no bruits, no adenopathy   Chest:   clear to auscultation, symmetrical breath sounds, no wheezes, no rhonchi   CV:   RRR, no  murmur   Abdomen:  soft, non-tender, no masses   Extremities:  warm, well-perfused, pulses palpable, no lower extremity edema  Rectal/GU  Deferred  Neuro:   Grossly non-focal and  symmetrical throughout, + tremor  Skin:   Clean and dry, no rashes, no breakdown  Diagnostic Tests:  Lab Results: Recent Labs    08/24/18 2208  WBC 10.9*  HGB 12.6*  HCT 36.8*  PLT 130*   BMET:  Recent Labs    08/24/18 2208 08/25/18 0716  NA 132* 131*  K 3.9 4.3  CL 95* 98  CO2 26 22  GLUCOSE 133* 124*  BUN 13 14  CREATININE 1.17 1.31*  CALCIUM 9.3 8.5*    CBG (last 3)  Recent Labs    08/25/18 0637  GLUCAP 142*   PT/INR:   Recent Labs    08/25/18 0252  LABPROT 13.3  INR 1.02    CXR:  CHEST - 2 VIEW  COMPARISON:  11/16/2007  FINDINGS: No focal opacity or pleural effusion. Mild bronchitic changes. Cardiomediastinal silhouette within normal limits. No pneumothorax.  IMPRESSION: No active cardiopulmonary disease.   Electronically Signed   By: Jasmine Pang M.D.   On: 08/24/2018 22:31    CT ANGIOGRAPHY CHEST, ABDOMEN AND PELVIS  TECHNIQUE: Multidetector CT imaging through the chest, abdomen and pelvis was performed using the standard protocol during bolus administration of intravenous contrast. Multiplanar reconstructed images and MIPs were obtained and reviewed to evaluate the vascular anatomy.  CONTRAST:  ISOVUE-370 IOPAMIDOL (ISOVUE-370) INJECTION 76%  COMPARISON:  12/08/2006 CT chest. 12/21/2007 CT abdomen and pelvis.  FINDINGS: CTA CHEST FINDINGS  Cardiovascular: Wall thickening of the distal aortic arch extending from downstream to the left subclavian artery origin to the midthoracic level with slightly hyperintense attenuation to the blood pool on noncontrast CT and no enhancement after administration of intravenous contrast. Findings are compatible with an intramural hematoma. There is a shallow penetrating ulcer directed superiorly of the distal arch (series 6, image 64). No dissection flap propagates upstream into the ascending arch or downstream into the abdomen. Great vessel origins are widely patent. No aortic  aneurysm. Normal caliber main pulmonary artery. Normal heart size. No pericardial effusion.  Mediastinum/Nodes: No enlarged mediastinal, hilar, or axillary lymph nodes.  Thyroid gland, trachea, and esophagus demonstrate no significant findings.  Lungs/Pleura: Lungs are clear. No pleural effusion or pneumothorax.  Musculoskeletal: No chest wall abnormality. No acute or significant osseous findings.  Review of the MIP images confirms the above findings.  CTA ABDOMEN AND PELVIS FINDINGS  VASCULAR  Aorta: Normal caliber aorta without aneurysm, dissection, vasculitis or significant stenosis. Extensive calcific atherosclerosis.  Celiac: Common celiac and SMA root. Left gastric artery arises upstream from the aorta.  SMA: Patent without evidence of aneurysm, dissection, vasculitis or significant stenosis. Common celiac SMA root.  Renals: Both renal arteries are patent without evidence of aneurysm, dissection, vasculitis, fibromuscular dysplasia or significant stenosis.  IMA: Patent without evidence of aneurysm, dissection, vasculitis or significant stenosis.  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 Normal caliber left common iliac artery. Normal caliber external and internal iliac arteries.  Veins: No obvious venous abnormality within the limitations of this arterial phase study.  Review of the MIP images confirms the above findings.  NON-VASCULAR  Hepatobiliary: No focal liver abnormality is seen. No gallstones, gallbladder wall thickening, or biliary dilatation.  Pancreas: Pancreatic atrophy. No main duct dilatation.  Spleen: Normal in size without focal abnormality.  Adrenals/Urinary Tract: Adrenal glands are unremarkable. Orthotopic right kidney. Ectopic left kidney in the right lower quadrant inferior to the orthotopic right kidney. Ectopic left kidney extrarenal pelvis. No hydronephrosis. No focal kidney lesion. Small left  posterior bladder diverticulum.  Stomach/Bowel: Stomach is within normal limits. Appendix appears normal. No evidence of bowel wall thickening, distention, or inflammatory changes.  Lymphatic: No lymphadenopathy.  Reproductive: Normal.  Other: No abdominal wall hernia or abnormality. No abdominopelvic ascites.  Musculoskeletal: No fracture is seen.  Review of the MIP images confirms the above findings.  IMPRESSION: 1. Intramural hematoma within the distal aortic arch extending from downstream to the left subclavian artery to the midthoracic level. 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 2. No evidence of dissection flap, aortic aneurysm, or additional acute vascular abnormality. Widely patent great vessel origins. 3. 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  These results were called by telephone at the time of interpretation on 08/25/2018 at 3:03 am to Dr. Loleta Rose , who verbally acknowledged these results.   Electronically Signed   By: Mitzi Hansen M.D.   On: 08/25/2018 03:09    Impression:  Patient has acute type B intramural hematoma involving the descending thoracic aorta which developed in the setting of uncontrolled hypertension.  The patient's presenting symptoms have resolved with medical therapy.  I personally reviewed the patient's CT angiogram which reveals an uncomplicated intramural hematoma beginning distal to the left subclavian artery and extending throughout the majority of the descending thoracic aorta.  The intramural hematoma stops above the level of the diaphragmatic hiatus.  There is a moderate sized aneurysm involving the right common iliac artery.  The ascending aorta and transverse aortic arch are not affected by the intramural hematoma.  There is no sign of mediastinal fluid or pleural fluid.    Plan:  I agree with plans for invasive monitoring for management of strict blood pressure  control.  The patient should be seen in consultation by the vascular surgery team both for management of the intramural hematoma and the right common iliac artery aneurysm.  At some point the patient should be seen in consultation by cardiology to establish care for long-term follow-up of severe hypertension.  We will follow along periodically but please call if specific  problems or questions arise.   I spent in excess of 60 minutes during the conduct of this hospital consultation and >50% of this time involved direct face-to-face encounter for counseling and/or coordination of the patient's care.    Salvatore Decent. Cornelius Moras, MD 08/25/2018 6:44 PM

## 2018-08-25 NOTE — Progress Notes (Addendum)
Admitted this morning for chest pain, malignant hypertension, admitted to ICU for IV esmolol drip, patient has no further chest pain, found to have distal aortic arch intramural hematoma. Continue with esmolol drip, vascular surgery consult, vascular surgeon has been called from the ER, Dr. Wyn Quaker recommended tighter blood pressure pressure control.  Goal of the blood pressure is around 110/70 with heart rate, 60 bpm, patient has no PCP and does not take any medicines on a regular basis. Dr. Thelma Barge  ffrom CT surgery recommends transfer to tertiary care center for small penetrating ulcer of distal aortic arch with intramural hematoma, patient wants to go to [facility].  Continue IV esmolol drip at this time. Time spent  20 minutes.

## 2018-08-25 NOTE — Consult Note (Signed)
Name: [name] MRN: [id number] DOB: 26-Sep-1956    ADMISSION DATE:  08/24/2018 CONSULTATION DATE: 08/25/2018  REFERRING MD : Dr. Marjie Skiff   CHIEF COMPLAINT: Chest Pain   BRIEF PATIENT DESCRIPTION:  62 yo male admitted with intramural hematoma with small ulceration in the distal aortic arch, no dissection flap currently requiring esmolol and nicardipine gtts for sbp goal of 120 or less and hr goal 60 or less   SIGNIFICANT EVENTS/STUDIES:  10/15-Pt admitted to stepdown unit  10/15-CTA Chest revealed Negative for acute pulmonary embolus. Poorly opacified aorta however crescent-shaped high density with displacement of wall calcification at the distal arch and descending thoracic aorta is concerning for either acute intramural hematoma or dissection. Repeat CTA with dissection technique is advised. 10/15-CTA Chest/Abd/Pelvis for dissection revealed intramural hematoma within the distal aortic arch extending from downstream to the left subclavian artery to the midthoracic level. 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 No evidence of dissection flap, aortic aneurysm, or additional acute vascular abnormality. Widely patent great vessel origins. 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   HISTORY OF PRESENT ILLNESS:   This a 62 yo male with a PMH of HTN (non compliant with antihypertensives) and ETOH Abuse (3 forty ounces daily most recent drink 10/14) who presented to [facility] ER on 10/15 acute onset of non exertional, non radiating chest pain onset 3 to 4 hours while the pt was watching TV.  The pt also endorsed shortness breath and nausea.  Per ER notes the pt has not taken antihypertensives over 30 years. In the ER blood pressure 200's/100's. Therefore he received 10 mg iv labetalol.  However, bp remained elevated, therefore esmolol and nicardipine gtts initiated. Lab results revealed Na+ 132, glucose 133, troponin 0.04, wbc 10.9, hgb 12.6, and platelets 130.  CXR  negative and CTA Chest/Abd/Pelvis revealed intramural hematoma with small ulceration in the distal aortic arch without dissection flap.  Therefore, ER physician notified vascular surgery who recommended continuing esmolol and nicardipine gtts for bp control.  He was subsequently admitted to the stepdown unit for further workup and treatment.   PAST MEDICAL HISTORY :   has a past medical history of Hypertension.  has no past surgical history on file. Prior to Admission medications   Not on File   Allergies  Allergen Reactions  . Codeine Phosphate     REACTION: unspecified    FAMILY HISTORY:  family history is not on file. SOCIAL HISTORY:  reports that he has been smoking. He has never used smokeless tobacco. He reports that he drinks alcohol. He reports that he does not use drugs.  REVIEW OF SYSTEMS: Positives in BOLD  Constitutional: Negative for fever, chills, weight loss, malaise/fatigue and diaphoresis.  HENT: Negative for hearing loss, ear pain, nosebleeds, congestion, sore throat, neck pain, tinnitus and ear discharge.   Eyes: Negative for blurred vision, double vision, photophobia, pain, discharge and redness.  Respiratory: Negative for cough, hemoptysis, sputum production, shortness of breath, wheezing and stridor.   Cardiovascular: chest pain, palpitations, orthopnea, claudication, leg swelling and PND.  Gastrointestinal: Negative for heartburn, nausea, vomiting, abdominal pain, diarrhea, constipation, blood in stool and melena.  Genitourinary: Negative for dysuria, urgency, frequency, hematuria and flank pain.  Musculoskeletal: Negative for myalgias, back pain, joint pain and falls.  Skin: Negative for itching and rash.  Neurological: Negative for dizziness, tingling, tremors, sensory change, speech change, focal weakness, seizures, loss of consciousness, weakness and headaches.  Endo/Heme/Allergies: Negative for environmental allergies and polydipsia.  Does not bruise/bleed  easily.  SUBJECTIVE:  No complaints at this time  VITAL SIGNS: Temp:  [98.6 F (37 C)] 98.6 F (37 C) (10/14 2205) Pulse Rate:  [72-112] 72 (10/15 0412) Resp:  [14-24] 16 (10/15 0412) BP: (97-225)/(63-155) 97/67 (10/15 0412) SpO2:  [93 %-99 %] 93 % (10/15 0412) Weight:  [63.5 kg] 63.5 kg (10/14 2202)  PHYSICAL EXAMINATION: General: well developed, well nourished male, NAD  Neuro: alert and oriented, follows commands  HEENT: supple, no JVD  Cardiovascular: nsr, rrr, non R/G Lungs: clear throughout, even, non labored  Abdomen: +BS x4, soft, non distended, non tender Musculoskeletal: normal bulk and tone, no edema  Skin: intact no rashes or lesions  Recent Labs  Lab 08/24/18 2208  NA 132*  K 3.9  CL 95*  CO2 26  BUN 13  CREATININE 1.17  GLUCOSE 133*   Recent Labs  Lab 08/24/18 2208  HGB 12.6*  HCT 36.8*  WBC 10.9*  PLT 130*   Dg Chest 2 View  Result Date: 08/24/2018 CLINICAL DATA:  Chest pain EXAM: CHEST - 2 VIEW COMPARISON:  11/16/2007 FINDINGS: No focal opacity or pleural effusion. Mild bronchitic changes. Cardiomediastinal silhouette within normal limits. No pneumothorax. IMPRESSION: No active cardiopulmonary disease. Electronically Signed   By: Jasmine Pang M.D.   On: 08/24/2018 22:31   Ct Angio Chest Pe W And/or Wo Contrast  Result Date: 08/25/2018 CLINICAL DATA:  Chest pain short of breath EXAM: CT ANGIOGRAPHY CHEST WITH CONTRAST TECHNIQUE: Multidetector CT imaging of the chest was performed using the standard protocol during bolus administration of intravenous contrast. Multiplanar CT image reconstructions and MIPs were obtained to evaluate the vascular anatomy. CONTRAST:  75mL OMNIPAQUE IOHEXOL 350 MG/ML SOLN COMPARISON:  Chest x-ray 08/24/2018, CT chest 12/08/2006 FINDINGS: Cardiovascular: Satisfactory opacification of the pulmonary arteries to the segmental level. No evidence of pulmonary embolism. Nonaneurysmal aorta. Suboptimal contrast opacification of  the aorta. Moderate aortic atherosclerosis. Displacement of wall calcification at the distal arch and descending thoracic aorta, best seen on sagittal views concerning for intramural hematoma or possible dissection. Further evaluation limited due to suboptimal contrast opacification of the aorta. Heart size within normal limits. No pericardial effusion. Coronary vascular calcification. Mediastinum/Nodes: Midline trachea. No thyroid mass. No significant adenopathy. Esophagus within normal limits. Lungs/Pleura: Lungs are clear. No pleural effusion or pneumothorax. Upper Abdomen: No acute abnormality. Musculoskeletal: No chest wall abnormality. No acute or significant osseous findings. Review of the MIP images confirms the above findings. IMPRESSION: 1. Negative for acute pulmonary embolus. 2. Poorly opacified aorta however crescent-shaped high density with displacement of wall calcification at the distal arch and descending thoracic aorta is concerning for either acute intramural hematoma or dissection. Repeat CTA with dissection technique is advised. Critical Value/emergent results were called by telephone at the time of interpretation on 08/25/2018 at 2:17 am to Dr. York Cerise , who verbally acknowledged these results. Aortic Atherosclerosis (ICD10-I70.0). Electronically Signed   By: Jasmine Pang M.D.   On: 08/25/2018 02:17   Ct Angio Chest/abd/pel For Dissection W And/or W/wo  Result Date: 08/25/2018 CLINICAL DATA:  62 y/o M; Chest pain, AAS suspected, hemodynamically stable, no prior aorta intervention. EXAM: CT ANGIOGRAPHY CHEST, ABDOMEN AND PELVIS TECHNIQUE: Multidetector CT imaging through the chest, abdomen and pelvis was performed using the standard protocol during bolus administration of intravenous contrast. Multiplanar reconstructed images and MIPs were obtained and reviewed to evaluate the vascular anatomy. CONTRAST:  ISOVUE-370 IOPAMIDOL (ISOVUE-370) INJECTION 76% COMPARISON:  12/08/2006 CT  chest. 12/21/2007  CT abdomen and pelvis. FINDINGS: CTA CHEST FINDINGS Cardiovascular: Wall thickening of the distal aortic arch extending from downstream to the left subclavian artery origin to the midthoracic level with slightly hyperintense attenuation to the blood pool on noncontrast CT and no enhancement after administration of intravenous contrast. Findings are compatible with an intramural hematoma. There is a shallow penetrating ulcer directed superiorly of the distal arch (series 6, image 64). No dissection flap propagates upstream into the ascending arch or downstream into the abdomen. Great vessel origins are widely patent. No aortic aneurysm. Normal caliber main pulmonary artery. Normal heart size. No pericardial effusion. Mediastinum/Nodes: No enlarged mediastinal, hilar, or axillary lymph nodes. Thyroid gland, trachea, and esophagus demonstrate no significant findings. Lungs/Pleura: Lungs are clear. No pleural effusion or pneumothorax. Musculoskeletal: No chest wall abnormality. No acute or significant osseous findings. Review of the MIP images confirms the above findings. CTA ABDOMEN AND PELVIS FINDINGS VASCULAR Aorta: Normal caliber aorta without aneurysm, dissection, vasculitis or significant stenosis. Extensive calcific atherosclerosis. Celiac: Common celiac and SMA root. Left gastric artery arises upstream from the aorta. SMA: Patent without evidence of aneurysm, dissection, vasculitis or significant stenosis. Common celiac SMA root. Renals: Both renal arteries are patent without evidence of aneurysm, dissection, vasculitis, fibromuscular dysplasia or significant stenosis. IMA: Patent without evidence of aneurysm, dissection, vasculitis or significant stenosis. 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 Normal caliber left common iliac artery. Normal caliber external and internal iliac arteries. Veins: No obvious venous abnormality within the limitations of this arterial phase  study. Review of the MIP images confirms the above findings. NON-VASCULAR Hepatobiliary: No focal liver abnormality is seen. No gallstones, gallbladder wall thickening, or biliary dilatation. Pancreas: Pancreatic atrophy. No main duct dilatation. Spleen: Normal in size without focal abnormality. Adrenals/Urinary Tract: Adrenal glands are unremarkable. Orthotopic right kidney. Ectopic left kidney in the right lower quadrant inferior to the orthotopic right kidney. Ectopic left kidney extrarenal pelvis. No hydronephrosis. No focal kidney lesion. Small left posterior bladder diverticulum. Stomach/Bowel: Stomach is within normal limits. Appendix appears normal. No evidence of bowel wall thickening, distention, or inflammatory changes. Lymphatic: No lymphadenopathy. Reproductive: Normal. Other: No abdominal wall hernia or abnormality. No abdominopelvic ascites. Musculoskeletal: No fracture is seen. Review of the MIP images confirms the above findings. IMPRESSION: 1. Intramural hematoma within the distal aortic arch extending from downstream to the left subclavian artery to the midthoracic level. 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 2. No evidence of dissection flap, aortic aneurysm, or additional acute vascular abnormality. Widely patent great vessel origins. 3. 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 These results were called by telephone at the time of interpretation on 08/25/2018 at 3:03 am to Dr. Loleta Rose , who verbally acknowledged these results. Electronically Signed   By: Mitzi Hansen M.D.   On: 08/25/2018 03:09    ASSESSMENT / PLAN:  Intramural hematoma with small ulceration in the distal aortic arch, no dissection flap Angina  Hypertension Urgency  Hx: Smoking and ETOH Abuse   Continuous telemetry monitoring  Trend troponin's  Lipid panel and hemoglobin A1c pending  Continue esmolol and nicardipine gtts to maintain sbp goal of 120 or less and hr goal  60 or less  Prn morphine for pain management  Continue nicotine patch  CIWA protocol  Will start mvi, thiamine, and folic acid  Smoking and ETOH abuse cessation counseling provided  VTE px: SCD's, avoid chemical prophylaxis  Will consult care management for discharge planning  Marda Stalker, [place] Pager [phone] (please enter 7 digits) PCCM Consult Pager [phone] (please enter 7 digits)

## 2018-08-25 NOTE — Progress Notes (Signed)
[facility] called for isolation status regarding patient transferring to [facility]. Confirmed no isolation. Bed possilbe today. Will call us back to confirm room and bed. Surgeon will be Dr. Durwin Nora.

## 2018-08-25 NOTE — H&P (Signed)
[facility] [place] [facility]   PATIENT NAME: [name]    MR#:  [id number]  DATE OF BIRTH:  09/08/1956  DATE OF ADMISSION:  08/24/2018  PRIMARY CARE PHYSICIAN: Patient, No Pcp Per   REQUESTING/REFERRING PHYSICIAN: Loleta Rose, MD  CHIEF COMPLAINT:   Chief Complaint  Patient presents with  . Chest Pain    HISTORY OF PRESENT ILLNESS:  [name]  is a 62 y.o. 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 Pt states that he woke up on Monday (10/14) morning w/o issue. He initially felt well. During the day, he developed chest pain, which he characterizes as a dull midchest ache. He felt as through the pain was throbbing in coordination with his heartbeat. He endorsed having some mild SOB and palpitations and the time, but denies cough, diaphoresis, N/V, LH/LOC. He states that the pain became progressively/gradually worse over the course of the day. In the afternoon, he went to lie down for a short time. He woke up to worsening CP. He stood up, and the pain became markedly worse. He endorses worsening w/ movement and change in position. He told his wife, who called EMS. BP in ED as high as 224/155. CTA chest report reads, "Intramural hematoma within the distal aortic arch extending from downstream to the left subclavian artery to the midthoracic level. 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 No evidence of dissection flap, aortic aneurysm, or additional acute vascular abnormality. Widely patent great vessel origins." Vascular Surgery (Dr. Wyn Quaker) contacted by ED provider (Dr. York Cerise), pt started on Cardene gtt (goal SBP < 120), Esmolol gtt (goal HR < 60), admitted to  [facility]. Pt is comfortable, pain-free and in no distress at the time of my evaluation.  PAST MEDICAL HISTORY:   Past Medical History:  Diagnosis Date  . Hypertension     PAST SURGICAL HISTORY:  History reviewed. No pertinent surgical history.  SOCIAL HISTORY:   Social History   Tobacco Use  . Smoking status: Current Every Day Smoker  . Smokeless tobacco: Never Used  Substance Use Topics  . Alcohol use: Yes    FAMILY HISTORY:  History reviewed. No pertinent family history.  DRUG ALLERGIES:   Allergies  Allergen Reactions  . Codeine Phosphate     REACTION: unspecified    REVIEW OF SYSTEMS:   Review of Systems  Constitutional: Negative for chills, diaphoresis, fever, malaise/fatigue and weight loss.  HENT: Positive for hearing loss (Seems to be hard of hearing.). Negative for congestion, ear pain, nosebleeds, sinus pain, sore throat and tinnitus.   Eyes: Negative for blurred vision, double vision and photophobia.  Respiratory: Positive for shortness of breath. Negative for cough, hemoptysis, sputum production and wheezing.   Cardiovascular: Positive for chest pain and palpitations. Negative for orthopnea, claudication, leg swelling and PND.  Gastrointestinal: Negative for abdominal pain, blood in stool, constipation, diarrhea, heartburn, melena, nausea and vomiting.  Genitourinary: Negative for dysuria, frequency, hematuria and urgency.  Musculoskeletal: Positive for back pain (Chronic low back pain.). Negative for joint pain, myalgias and neck pain.  Skin: Negative for itching and rash.  Neurological: Negative for dizziness, tingling, tremors, sensory change, speech change, focal weakness, seizures, loss of consciousness, weakness  and headaches.  Psychiatric/Behavioral: Negative for memory loss. The patient does not have insomnia.    MEDICATIONS AT HOME:   Prior to Admission medications   Not on File     VITAL SIGNS:  Blood pressure (!) 88/64, pulse 75,  temperature 98.6 F (37 C), temperature source Oral, resp. rate 13, height 5\' 11"  (1.803 m), weight 63.5 kg, SpO2 94 %.  PHYSICAL EXAMINATION:  Physical Exam  Constitutional: He is oriented to person, place, and time. He appears well-developed and well-nourished. He is active and cooperative.  Non-toxic appearance. He does not have a sickly appearance. He does not appear ill. No distress. He is not intubated.  HENT:  Head: Normocephalic and atraumatic.  Mouth/Throat: Oropharynx is clear and moist. No oropharyngeal exudate.  Eyes: Conjunctivae, EOM and lids are normal. No scleral icterus.  Neck: Neck supple. No JVD present. No thyromegaly present.  Cardiovascular: Normal rate, regular rhythm, S1 normal, S2 normal and normal heart sounds.  No extrasystoles are present. Exam reveals no gallop, no S3, no S4, no distant heart sounds and no friction rub.  No murmur heard. Pulmonary/Chest: Effort normal. No accessory muscle usage or stridor. No apnea, no tachypnea and no bradypnea. He is not intubated. No respiratory distress. He has decreased breath sounds in the right upper field, the right middle field, the right lower field, the left upper field, the left middle field and the left lower field. He has no wheezes. He has no rhonchi. He has no rales.  Abdominal: Soft. Bowel sounds are normal. He exhibits no distension. There is no tenderness. There is no rigidity, no rebound and no guarding.  Musculoskeletal: Normal range of motion. He exhibits no edema or tenderness.       Right lower leg: Normal. He exhibits no tenderness and no edema.       Left lower leg: Normal. He exhibits no tenderness and no edema.  Lymphadenopathy:    He has no cervical adenopathy.  Neurological: He is alert and oriented to person, place, and time. He is not disoriented. No cranial nerve deficit.  Skin: Skin is warm, dry and intact. No rash noted. He is not diaphoretic. No erythema.  Psychiatric: He has a normal mood and  affect. His speech is normal and behavior is normal. Judgment and thought content normal. His mood appears not anxious. He is not agitated. Cognition and memory are normal.   LABORATORY PANEL:   CBC Recent Labs  Lab 08/24/18 2208  WBC 10.9*  HGB 12.6*  HCT 36.8*  PLT 130*   ------------------------------------------------------------------------------------------------------------------  Chemistries  Recent Labs  Lab 08/24/18 2208  NA 132*  K 3.9  CL 95*  CO2 26  GLUCOSE 133*  BUN 13  CREATININE 1.17  CALCIUM 9.3  AST 23  ALT 14  ALKPHOS 78  BILITOT 0.6   ------------------------------------------------------------------------------------------------------------------  Cardiac Enzymes Recent Labs  Lab 08/25/18 0404  TROPONINI 0.03*   ------------------------------------------------------------------------------------------------------------------  RADIOLOGY:  Dg Chest 2 View  Result Date: 08/24/2018 CLINICAL DATA:  Chest pain EXAM: CHEST - 2 VIEW COMPARISON:  11/16/2007 FINDINGS: No focal opacity or pleural effusion. Mild bronchitic changes. Cardiomediastinal silhouette within normal limits. No pneumothorax. IMPRESSION: No active cardiopulmonary disease. Electronically Signed   By: Jasmine Pang M.D.   On: 08/24/2018 22:31   Ct Angio Chest Pe W And/or Wo Contrast  Result Date: 08/25/2018 CLINICAL DATA:  Chest pain short of breath EXAM: CT ANGIOGRAPHY CHEST WITH CONTRAST TECHNIQUE: Multidetector CT imaging of the chest was performed using  the standard protocol during bolus administration of intravenous contrast. Multiplanar CT image reconstructions and MIPs were obtained to evaluate the vascular anatomy. CONTRAST:  75mL OMNIPAQUE IOHEXOL 350 MG/ML SOLN COMPARISON:  Chest x-ray 08/24/2018, CT chest 12/08/2006 FINDINGS: Cardiovascular: Satisfactory opacification of the pulmonary arteries to the segmental level. No evidence of pulmonary embolism. Nonaneurysmal aorta.  Suboptimal contrast opacification of the aorta. Moderate aortic atherosclerosis. Displacement of wall calcification at the distal arch and descending thoracic aorta, best seen on sagittal views concerning for intramural hematoma or possible dissection. Further evaluation limited due to suboptimal contrast opacification of the aorta. Heart size within normal limits. No pericardial effusion. Coronary vascular calcification. Mediastinum/Nodes: Midline trachea. No thyroid mass. No significant adenopathy. Esophagus within normal limits. Lungs/Pleura: Lungs are clear. No pleural effusion or pneumothorax. Upper Abdomen: No acute abnormality. Musculoskeletal: No chest wall abnormality. No acute or significant osseous findings. Review of the MIP images confirms the above findings. IMPRESSION: 1. Negative for acute pulmonary embolus. 2. Poorly opacified aorta however crescent-shaped high density with displacement of wall calcification at the distal arch and descending thoracic aorta is concerning for either acute intramural hematoma or dissection. Repeat CTA with dissection technique is advised. Critical Value/emergent results were called by telephone at the time of interpretation on 08/25/2018 at 2:17 am to Dr. York Cerise , who verbally acknowledged these results. Aortic Atherosclerosis (ICD10-I70.0). Electronically Signed   By: Jasmine Pang M.D.   On: 08/25/2018 02:17   Ct Angio Chest/abd/pel For Dissection W And/or W/wo  Result Date: 08/25/2018 CLINICAL DATA:  62 y/o M; Chest pain, AAS suspected, hemodynamically stable, no prior aorta intervention. EXAM: CT ANGIOGRAPHY CHEST, ABDOMEN AND PELVIS TECHNIQUE: Multidetector CT imaging through the chest, abdomen and pelvis was performed using the standard protocol during bolus administration of intravenous contrast. Multiplanar reconstructed images and MIPs were obtained and reviewed to evaluate the vascular anatomy. CONTRAST:  ISOVUE-370 IOPAMIDOL (ISOVUE-370)  INJECTION 76% COMPARISON:  12/08/2006 CT chest. 12/21/2007 CT abdomen and pelvis. FINDINGS: CTA CHEST FINDINGS Cardiovascular: Wall thickening of the distal aortic arch extending from downstream to the left subclavian artery origin to the midthoracic level with slightly hyperintense attenuation to the blood pool on noncontrast CT and no enhancement after administration of intravenous contrast. Findings are compatible with an intramural hematoma. There is a shallow penetrating ulcer directed superiorly of the distal arch (series 6, image 64). No dissection flap propagates upstream into the ascending arch or downstream into the abdomen. Great vessel origins are widely patent. No aortic aneurysm. Normal caliber main pulmonary artery. Normal heart size. No pericardial effusion. Mediastinum/Nodes: No enlarged mediastinal, hilar, or axillary lymph nodes. Thyroid gland, trachea, and esophagus demonstrate no significant findings. Lungs/Pleura: Lungs are clear. No pleural effusion or pneumothorax. Musculoskeletal: No chest wall abnormality. No acute or significant osseous findings. Review of the MIP images confirms the above findings. CTA ABDOMEN AND PELVIS FINDINGS VASCULAR Aorta: Normal caliber aorta without aneurysm, dissection, vasculitis or significant stenosis. Extensive calcific atherosclerosis. Celiac: Common celiac and SMA root. Left gastric artery arises upstream from the aorta. SMA: Patent without evidence of aneurysm, dissection, vasculitis or significant stenosis. Common celiac SMA root. Renals: Both renal arteries are patent without evidence of aneurysm, dissection, vasculitis, fibromuscular dysplasia or significant stenosis. IMA: Patent without evidence of aneurysm, dissection, vasculitis or significant stenosis. 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 Normal caliber left common iliac artery. Normal caliber external and internal iliac arteries. Veins: No obvious venous abnormality within  the limitations of this arterial phase study.  Review of the MIP images confirms the above findings. NON-VASCULAR Hepatobiliary: No focal liver abnormality is seen. No gallstones, gallbladder wall thickening, or biliary dilatation. Pancreas: Pancreatic atrophy. No main duct dilatation. Spleen: Normal in size without focal abnormality. Adrenals/Urinary Tract: Adrenal glands are unremarkable. Orthotopic right kidney. Ectopic left kidney in the right lower quadrant inferior to the orthotopic right kidney. Ectopic left kidney extrarenal pelvis. No hydronephrosis. No focal kidney lesion. Small left posterior bladder diverticulum. Stomach/Bowel: Stomach is within normal limits. Appendix appears normal. No evidence of bowel wall thickening, distention, or inflammatory changes. Lymphatic: No lymphadenopathy. Reproductive: Normal. Other: No abdominal wall hernia or abnormality. No abdominopelvic ascites. Musculoskeletal: No fracture is seen. Review of the MIP images confirms the above findings. IMPRESSION: 1. Intramural hematoma within the distal aortic arch extending from downstream to the left subclavian artery to the midthoracic level. 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 2. No evidence of dissection flap, aortic aneurysm, or additional acute vascular abnormality. Widely patent great vessel origins. 3. 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 These results were called by telephone at the time of interpretation on 08/25/2018 at 3:03 am to Dr. Loleta Rose , who verbally acknowledged these results. Electronically Signed   By: Mitzi Hansen M.D.   On: 08/25/2018 03:09   IMPRESSION AND PLAN:   A/P: 6M p/w CP, unctrl HTN/HTN urgency/accelerated malignant HTN. CTA chest (+) distal aortic arch ulcer + intramural hematoma. Hypochloremic hyponatremia, hyperglycemia, Troponin elevation, leukocytosis, normocytic anemia. Tobacco use D/O. -CP,  distal aortic arch ulcer + intramural  hematoma: Vascular Surgery (Dr. Wyn Quaker) contacted by ED provider (Dr. York Cerise), pt started on Cardene gtt (goal SBP < 120), Esmolol gtt (goal HR < 60). No systemic AC for mural thrombus. Admit to Stepdown, cardiac monitoring + pulse oximetry. Far as I know, intention is for pt to be managed medically, however Vascular Surgery inpatient consultation has been requested, for purposes of evaluating need for stent-graft or other variety of endovascular repair. Pain ctrl. Lipid, A1c pending for risk stratification/modification. -Unctrl HTN/HTN urgency/accelerated malignant HTN, hypochloremic hyponatremia: Suggestive of intravascular volume depletion, consistent w/ unctrl/accelerated malignant HTN and concomitant pressure natriuresis. BP improved w/ mgmt as above, gentle IVF. Hx CM w/ depressed EF (35% in 1999, improved to 60% in 2008). Echo to evaluate for hypertensive cardiomyopathy. Transition to oral agents. -Hyperglycemia: It seems our good man enjoys himself quite a bit of chocolate pudding. HbA1c pending. -Troponin elevation: Likely 2/2 demand in the setting of elevated BP. Trend. ASA. Cardiac monitoring. -Normocytic anemia: Likely 2/2 anemia of chronic disease. No evidence of acute bleeding at present time. -Tobacco use D/O: Nicotine patch. -FEN/GI: Cardiac diet. -DVT PPx: Lovenox. -Code status: Full code. -Disposition: Admission, > 2 midnights.   All the records are reviewed and case discussed with ED provider. Management plans discussed with the patient, family and they are in agreement.  CODE STATUS: Full code.  TOTAL TIME TAKING CARE OF THIS PATIENT: 75 minutes.    Barbaraann Rondo M.D on 08/25/2018 at 4:41 AM  Between 7am to 6pm - Pager - [phone]  After 6pm go to [email or web address] - Social research officer, government  [facility] [place]  Office  [phone]  CC: Primary care physician; Patient, No Pcp Per   Note: This dictation was prepared with Dragon dictation along with  smaller phrase technology. Any transcriptional errors that result from this process are unintentional.

## 2018-08-25 NOTE — ED Notes (Signed)
Report off to stephanie rn  

## 2018-08-25 NOTE — ED Notes (Signed)
Hospitalist at the bedside 

## 2018-08-25 NOTE — ED Provider Notes (Signed)
----------------------------------------- 12:11 AM on 08/25/2018 -----------------------------------------   Assuming care from Dr. Marisa Severin.  In short, [name] is a 62 y.o. male with a chief complaint of chest pain.  Refer to the original H&P for additional details.  The current plan of care is to follow up CTA results.  Patient will likely need admission for ACS rule out even if CTA chest is negative.  Dr. Marisa Severin is most suspicious of PE but aortic dissection is also in the differential.  BP recently went up substantially and Dr. Marisa Severin is providing labetalol 10 mg IV and has ordered a chest CTA.   ----------------------------------------- 2:26 AM on 08/25/2018 -----------------------------------------  Dr.Fujinaga with radiology call me with concerning results for aortic dissection.  She explained that since the study was specifically for pulmonary embolism it is not optimal but there are a couple of areas very concerning but need further evaluation.  In spite of the relatively recent scan, she recommended that we scan him chest, abdomen, and pelvis with CTA optimized for dissection.  I checked on the patient and he is ambulatory around the room and says he feels much better.  However in spite of the labetalol his blood pressure is still 206/144 and heart rate is about 115.  I have ordered esmolol with bolus plus infusion as well as nicardipine with rapid titration parameters in order to bring down his heart rate and blood pressure rapidly with a goal of a heart rate around 60 and a systolic blood pressure around 120.  The patient is in no distress, reporting no chest pain or shortness of breath, still reporting some abdominal discomfort.  He has been transported to the CT scanner just a few minutes ago and the nicardipine is running, will the esmolol is being set up to start as soon as he gets back from the scanner.  Radiology results: Ct Angio Chest Pe W And/or Wo Contrast  Result  Date: 08/25/2018 CLINICAL DATA:  Chest pain short of breath EXAM: CT ANGIOGRAPHY CHEST WITH CONTRAST TECHNIQUE: Multidetector CT imaging of the chest was performed using the standard protocol during bolus administration of intravenous contrast. Multiplanar CT image reconstructions and MIPs were obtained to evaluate the vascular anatomy. CONTRAST:  75mL OMNIPAQUE IOHEXOL 350 MG/ML SOLN COMPARISON:  Chest x-ray 08/24/2018, CT chest 12/08/2006 FINDINGS: Cardiovascular: Satisfactory opacification of the pulmonary arteries to the segmental level. No evidence of pulmonary embolism. Nonaneurysmal aorta. Suboptimal contrast opacification of the aorta. Moderate aortic atherosclerosis. Displacement of wall calcification at the distal arch and descending thoracic aorta, best seen on sagittal views concerning for intramural hematoma or possible dissection. Further evaluation limited due to suboptimal contrast opacification of the aorta. Heart size within normal limits. No pericardial effusion. Coronary vascular calcification. Mediastinum/Nodes: Midline trachea. No thyroid mass. No significant adenopathy. Esophagus within normal limits. Lungs/Pleura: Lungs are clear. No pleural effusion or pneumothorax. Upper Abdomen: No acute abnormality. Musculoskeletal: No chest wall abnormality. No acute or significant osseous findings. Review of the MIP images confirms the above findings. IMPRESSION: 1. Negative for acute pulmonary embolus. 2. Poorly opacified aorta however crescent-shaped high density with displacement of wall calcification at the distal arch and descending thoracic aorta is concerning for either acute intramural hematoma or dissection. Repeat CTA with dissection technique is advised. Critical Value/emergent results were called by telephone at the time of interpretation on 08/25/2018 at 2:17 am to Dr. York Cerise , who verbally acknowledged these results. Aortic Atherosclerosis (ICD10-I70.0). Electronically Signed   By: Adrian Prows.D.  On: 08/25/2018 02:17    ----------------------------------------- 3:06 AM on 08/25/2018 -----------------------------------------  Discussed by phone with Dr. Harrie Jeans.  Intramural hematoma w/ small ulceration in the distal aortic arch, no dissection flap, technically a "B" classification.  Pathology is downstream going up towards the L subclavian.  Paging Dr. Wyn Quaker to discuss.  Still titrating esmolol and nicardipine.   ----------------------------------------- 3:23 AM on 08/25/2018 -----------------------------------------  Spoke with Dr. Wyn Quaker who confirmed medical management plan, agreed with SBP goal of 120 or less and HR goal of 60 or less.  Analgesia PRN.  No heparin.  Will see patient in morning.  Paging hospitalist to discuss.  ----------------------------------------- 3:56 AM on 08/25/2018 -----------------------------------------  Spoke with the hospitalist and discussed the case in detail including the blood pressure and heart rate recommendations.  He will admit for further management.  Patient continues to be in no acute distress.  Blood pressure is at goal but heart rate still needs to be brought down further and we will continue to adjust the esmolol.   .Critical Care Performed by: Loleta Rose, MD Authorized by: Loleta Rose, MD   Critical care provider statement:    Critical care time (minutes):  60   Critical care time was exclusive of:  Separately billable procedures and treating other patients   Critical care was necessary to treat or prevent imminent or life-threatening deterioration of the following conditions: aortic wall ulcer and hematoma with hypertensive emergency.   Critical care was time spent personally by me on the following activities:  Development of treatment plan with patient or surrogate, discussions with consultants, evaluation of patient's response to treatment, examination of patient, obtaining history from patient or surrogate,  ordering and performing treatments and interventions, ordering and review of laboratory studies, ordering and review of radiographic studies, pulse oximetry, re-evaluation of patient's condition and review of old charts      Loleta Rose, MD 08/25/18 [phone]

## 2018-08-25 NOTE — H&P (Signed)
REASON FOR CONSULT:    Penetrating ulcer of descending thoracic aorta.  The patient was transferred from [facility].  The consult was requested by Dr. Thelma Barge.   HPI:   [name] is a pleasant 62 y.o. male, who was admitted at [facility] yesterday evening with the onset of substernal chest pain.  He presented to the emergency department and work-up included a CT scan of the chest which showed a small penetrating ulcer in the proximal descending thoracic aorta just beyond the subclavian artery.  There was intramural hematoma extending from the left subclavian artery to the midportion of the thoracic aorta.  There is no evidence of dissection or extravasation.  Patient was admitted for blood pressure control and is currently pain-free.  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  Given his CT findings we were asked to accept the patient in transfer.  On my history, the patient developed the sudden onset of substernal chest pain at 6 AM yesterday.  This lasted only briefly.  He then developed recurrent pain at 6 PM which was fairly severe and persistent.  This is when he presented to the emergency department at [facility].  His blood pressure was brought under good control and this resolved.  He did have some pleuritic chest pain prior to admission.  At this point his chest pain has completely resolved.  He denies any history of claudication, rest pain, or nonhealing ulcers.  He denies any family history of aneurysmal disease.  He does have a heavy history of smoking began smoking when he was 62 years old and smokes just under a 1 pack/day.  He drinks alcohol daily.  He was not on any medications for his blood pressure.  Past Medical History:  Diagnosis Date  . Hypertension     No family history on file.  There is no family history of premature cardiovascular  disease.  SOCIAL HISTORY: Social History   Socioeconomic History  . Marital status: Married    Spouse name: Not on file  . Number of children: Not on file  . Years of education: Not on file  . Highest education level: Not on file  Occupational History  . Not on file  Social Needs  . Financial resource strain: Not on file  . Food insecurity:    Worry: Not on file    Inability: Not on file  . Transportation needs:    Medical: Not on file    Non-medical: Not on file  Tobacco Use  . Smoking status: Current Every Day Smoker  . Smokeless tobacco: Never Used  Substance and Sexual Activity  . Alcohol use: Yes  . Drug use: Never  . Sexual activity: Not on file  Lifestyle  . Physical activity:    Days per week: Not on file    Minutes per session: Not on file  . Stress: Not on file  Relationships  . Social connections:    Talks on phone: Not on file    Gets together: Not on file    Attends religious service: Not on file    Active member of club or organization: Not on file    Attends meetings of clubs or organizations: Not on file    Relationship status: Not on file  . Intimate partner violence:    Fear of current or ex partner: Not on file  Emotionally abused: Not on file    Physically abused: Not on file    Forced sexual activity: Not on file  Other Topics Concern  . Not on file  Social History Narrative  . Not on file    Allergies  Allergen Reactions  . Codeine Phosphate     REACTION: unspecified    No current facility-administered medications for this encounter.     REVIEW OF SYSTEMS:  [X]  denotes positive finding, [ ]  denotes negative finding Cardiac  Comments:  Chest pain or chest pressure: x   Shortness of breath upon exertion:    Short of breath when lying flat:    Irregular heart rhythm:        Vascular    Pain in calf, thigh, or hip brought on by ambulation:    Pain in feet at night that wakes you up from your sleep:     Blood clot in your  veins:    Leg swelling:         Pulmonary    Oxygen at home:    Productive cough:     Wheezing:         Neurologic    Sudden weakness in arms or legs:     Sudden numbness in arms or legs:     Sudden onset of difficulty speaking or slurred speech:    Temporary loss of vision in one eye:     Problems with dizziness:         Gastrointestinal    Blood in stool:     Vomited blood:         Genitourinary    Burning when urinating:     Blood in urine:        Psychiatric    Major depression:         Hematologic    Bleeding problems:    Problems with blood clotting too easily:        Skin    Rashes or ulcers:        Constitutional    Fever or chills:     PHYSICAL EXAM:   Vitals:   08/25/18 1851 08/25/18 1900  BP: 118/89 (!) 131/102  Pulse: 72 75  Resp: 14 13  SpO2: 96% 95%    GENERAL: The patient is a well-nourished male, in no acute distress. The vital signs are documented above. CARDIAC: There is a regular rate and rhythm.  VASCULAR: I do not detect carotid bruits. He has palpable femoral, dorsalis pedis, and posterior tibial pulses bilaterally. He has no significant lower extremity swelling. PULMONARY: There is good air exchange bilaterally without wheezing or rales. ABDOMEN: Soft and non-tender with normal pitched bowel sounds.  I do not palpate an abdominal aortic aneurysm. MUSCULOSKELETAL: There are no major deformities or cyanosis. NEUROLOGIC: No focal weakness or paresthesias are detected. SKIN: There are no ulcers or rashes noted. PSYCHIATRIC: The patient has a normal affect.  DATA:    LABS: His creatinine is 1.3.  GFR is 58.  Hemoglobin A1c is 5.5.  LDL cholesterol was 70.  Total cholesterol 134.  White count is 10.9.  Hemoglobin 12.6.  Platelets 130,000.  CT ANGIOS CHEST: This shows no evidence of pulmonary embolus.  CT ANGIOGRAM CHEST ABDOMEN PELVIS: This shows a small penetrating ulcer in the proximal descending thoracic aorta.  There is intramural  hematoma within the distal arch extending to the mid thoracic level.  There is no evidence of dissection or aneurysm.  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   ASSESSMENT & PLAN:   PENETRATING ULCER AND INTRAMURAL HEMATOMA OF THE THORACIC AORTA: Patient will be admitted for tight blood pressure control.  He is currently asymptomatic.  I will review the films tomorrow with Dr. Durene Cal.   2.5 CM RIGHT COMMON ILIAC ARTERY ANEURYSM: He will need continued follow-up of this.  I will order a follow-up duplex in 6 months, unless he has a CT angiogram at that time.  We have discussed the importance of tobacco cessation and blood pressure control.  Waverly Ferrari [facility] [phone]

## 2018-08-25 NOTE — Progress Notes (Signed)
Discussed with Dr Clelia Croft regarding consult with vascular. At this time I have not seen them. Per MD ED doctor consulted vascular already.

## 2018-08-25 NOTE — ED Notes (Signed)
Attempted to call report

## 2018-08-25 NOTE — ED Notes (Signed)
Patient transported to CT 

## 2018-08-25 NOTE — Progress Notes (Signed)
Report given to Wes- [facility]. Packet is ready for transport.

## 2018-08-25 NOTE — ED Notes (Signed)
ED Provider at bedside. 

## 2018-08-25 NOTE — Progress Notes (Signed)
Pharmacy Electrolyte Monitoring Consult:  Pharmacy consulted to assist in monitoring and replacing electrolytes in this 62 y.o. male admitted on 08/24/2018 with aortic mural thrombus and penetrating atherosclerotic ulcer of aorta. Patient has history significant for alcoholism.   Patient is being evaluated for possible transfer to [facility].   Labs:  Sodium (mmol/L)  Date Value  08/25/2018 131 (L)  03/05/2014 126 (L)   Potassium (mmol/L)  Date Value  08/25/2018 4.3  03/05/2014 3.7   Magnesium (mg/dL)  Date Value  16/08/9603 1.5 (L)   Phosphorus (mg/dL)  Date Value  54/07/8118 3.4   Calcium (mg/dL)  Date Value  14/78/2956 8.5 (L)   Calcium, Total (mg/dL)  Date Value  21/30/8657 9.7   Albumin (g/dL)  Date Value  84/69/6295 4.2    Assessment/Plan: Will order magnesium 4g IV x 1. No further replacement warranted at this time.    Will recheck electrolytes with am labs.   In setting of cardiac issues and probably EtOH abuse, will replace for goal potassium ~ 4, goal magnesium ~ 2, and goal phos > 2.5   Pharmacy will continue to monitor and adjust per consult.    Simpson,Michael L 08/25/2018 2:01 PM

## 2018-08-25 NOTE — Discharge Summary (Signed)
Physician Discharge Summary  YUNIS VOORHEIS ZOX:[id number] DOB: [birth year] DOA: 08/24/2018  PCP: Patient, No Pcp Per  Admit date: 08/24/2018 Discharge date: 08/25/2018   Discharge Diagnoses:  Active Problems:   Aortic mural thrombus (HCC)   Penetrating atherosclerotic ulcer of aorta [facility])    Discharge Instructions per [facility] ambulance     Allergies  Allergen Reactions  . Codeine Phosphate     REACTION: unspecified    You were cared for by a hospitalist during your hospital stay. If you have any questions about your discharge medications or the care you received while you were in the hospital after you are discharged, you can call the unit and asked to speak with the hospitalist on call if the hospitalist that took care of you is not available. Once you are discharged, your primary care physician will handle any further medical issues. Please note that no refills for any discharge medications will be authorized once you are discharged, as it is imperative that you return to your primary care physician (or establish a relationship with a primary care physician if you do not have one) for your aftercare needs so that they can reassess your need for medications and monitor your lab values.  Consultations:  Cardiothoracic surgery   Procedures/Studies: Dg Chest 2 View  Result Date: 08/24/2018 CLINICAL DATA:  Chest pain EXAM: CHEST - 2 VIEW COMPARISON:  11/16/2007 FINDINGS: No focal opacity or pleural effusion. Mild bronchitic changes. Cardiomediastinal silhouette within normal limits. No pneumothorax. IMPRESSION: No active cardiopulmonary disease. Electronically Signed   By: Jasmine Pang M.D.   On: 08/24/2018 22:31   Ct Angio Chest Pe W And/or Wo Contrast  Result Date: 08/25/2018 CLINICAL DATA:  Chest pain short of breath EXAM: CT ANGIOGRAPHY CHEST WITH CONTRAST TECHNIQUE: Multidetector CT imaging of the chest was performed using the standard protocol during bolus administration of  intravenous contrast. Multiplanar CT image reconstructions and MIPs were obtained to evaluate the vascular anatomy. CONTRAST:  75mL OMNIPAQUE IOHEXOL 350 MG/ML SOLN COMPARISON:  Chest x-ray 08/24/2018, CT chest 12/08/2006 FINDINGS: Cardiovascular: Satisfactory opacification of the pulmonary arteries to the segmental level. No evidence of pulmonary embolism. Nonaneurysmal aorta. Suboptimal contrast opacification of the aorta. Moderate aortic atherosclerosis. Displacement of wall calcification at the distal arch and descending thoracic aorta, best seen on sagittal views concerning for intramural hematoma or possible dissection. Further evaluation limited due to suboptimal contrast opacification of the aorta. Heart size within normal limits. No pericardial effusion. Coronary vascular calcification. Mediastinum/Nodes: Midline trachea. No thyroid mass. No significant adenopathy. Esophagus within normal limits. Lungs/Pleura: Lungs are clear. No pleural effusion or pneumothorax. Upper Abdomen: No acute abnormality. Musculoskeletal: No chest wall abnormality. No acute or significant osseous findings. Review of the MIP images confirms the above findings. IMPRESSION: 1. Negative for acute pulmonary embolus. 2. Poorly opacified aorta however crescent-shaped high density with displacement of wall calcification at the distal arch and descending thoracic aorta is concerning for either acute intramural hematoma or dissection. Repeat CTA with dissection technique is advised. Critical Value/emergent results were called by telephone at the time of interpretation on 08/25/2018 at 2:17 am to Dr. York Cerise , who verbally acknowledged these results. Aortic Atherosclerosis (ICD10-I70.0). Electronically Signed   By: Jasmine Pang M.D.   On: 08/25/2018 02:17   Ct Angio Chest/abd/pel For Dissection W And/or W/wo  Result Date: 08/25/2018 CLINICAL DATA:  62 y/o M; Chest pain, AAS suspected, hemodynamically stable, no prior aorta  intervention. EXAM: CT ANGIOGRAPHY CHEST, ABDOMEN AND PELVIS TECHNIQUE: Multidetector  CT imaging through the chest, abdomen and pelvis was performed using the standard protocol during bolus administration of intravenous contrast. Multiplanar reconstructed images and MIPs were obtained and reviewed to evaluate the vascular anatomy. CONTRAST:  ISOVUE-370 IOPAMIDOL (ISOVUE-370) INJECTION 76% COMPARISON:  12/08/2006 CT chest. 12/21/2007 CT abdomen and pelvis. FINDINGS: CTA CHEST FINDINGS Cardiovascular: Wall thickening of the distal aortic arch extending from downstream to the left subclavian artery origin to the midthoracic level with slightly hyperintense attenuation to the blood pool on noncontrast CT and no enhancement after administration of intravenous contrast. Findings are compatible with an intramural hematoma. There is a shallow penetrating ulcer directed superiorly of the distal arch (series 6, image 64). No dissection flap propagates upstream into the ascending arch or downstream into the abdomen. Great vessel origins are widely patent. No aortic aneurysm. Normal caliber main pulmonary artery. Normal heart size. No pericardial effusion. Mediastinum/Nodes: No enlarged mediastinal, hilar, or axillary lymph nodes. Thyroid gland, trachea, and esophagus demonstrate no significant findings. Lungs/Pleura: Lungs are clear. No pleural effusion or pneumothorax. Musculoskeletal: No chest wall abnormality. No acute or significant osseous findings. Review of the MIP images confirms the above findings. CTA ABDOMEN AND PELVIS FINDINGS VASCULAR Aorta: Normal caliber aorta without aneurysm, dissection, vasculitis or significant stenosis. Extensive calcific atherosclerosis. Celiac: Common celiac and SMA root. Left gastric artery arises upstream from the aorta. SMA: Patent without evidence of aneurysm, dissection, vasculitis or significant stenosis. Common celiac SMA root. Renals: Both renal arteries are patent without  evidence of aneurysm, dissection, vasculitis, fibromuscular dysplasia or significant stenosis. IMA: Patent without evidence of aneurysm, dissection, vasculitis or significant stenosis. 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 Normal caliber left common iliac artery. Normal caliber external and internal iliac arteries. Veins: No obvious venous abnormality within the limitations of this arterial phase study. Review of the MIP images confirms the above findings. NON-VASCULAR Hepatobiliary: No focal liver abnormality is seen. No gallstones, gallbladder wall thickening, or biliary dilatation. Pancreas: Pancreatic atrophy. No main duct dilatation. Spleen: Normal in size without focal abnormality. Adrenals/Urinary Tract: Adrenal glands are unremarkable. Orthotopic right kidney. Ectopic left kidney in the right lower quadrant inferior to the orthotopic right kidney. Ectopic left kidney extrarenal pelvis. No hydronephrosis. No focal kidney lesion. Small left posterior bladder diverticulum. Stomach/Bowel: Stomach is within normal limits. Appendix appears normal. No evidence of bowel wall thickening, distention, or inflammatory changes. Lymphatic: No lymphadenopathy. Reproductive: Normal. Other: No abdominal wall hernia or abnormality. No abdominopelvic ascites. Musculoskeletal: No fracture is seen. Review of the MIP images confirms the above findings. IMPRESSION: 1. Intramural hematoma within the distal aortic arch extending from downstream to the left subclavian artery to the midthoracic level. 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 2. No evidence of dissection flap, aortic aneurysm, or additional acute vascular abnormality. Widely patent great vessel origins. 3. 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 These results were called by telephone at the time of interpretation on 08/25/2018 at 3:03 am to Dr. Loleta Rose , who verbally acknowledged these results. Electronically  Signed   By: Mitzi Hansen M.D.   On: 08/25/2018 03:09     Subjective: 62 year old gentleman with history of EtOH, hypertension came in with chest pain and shortness of breath, initial blood pressure was found to be in 200s, CTA chest negative for PE showed intramural hematoma and aortic arch distal so patient was admitted to ICU on Cardene and esmolol drips -No sedated overnight event -Patient was started back on  Cardene drip due to difficult to control blood pressure only on esmolol -Patient is currently denies any chest pain or any other complaint -Patient was evaluated by CT surgery who suggested transfer to [facility] -Call was made out to CT surgery service of their and transfer center  IMPRESSION: 1. Intramural hematoma within the distal aortic arch extending from downstream to the left subclavian artery to the midthoracic level. 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 2. No evidence of dissection flap, aortic aneurysm, or additional acute vascular abnormality. Widely patent great vessel origins. 3. 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  Assessment intramural hematoma with small ulceration in the distal aortic arch -History of EtOH -Hypertensive emergency -Active smoker -History of substance abuse  Plan: -Evaluated by CT surgery appear suggested to transfer to [facility] for further management and possible surgery -Call is made out to transfer center awaiting response back -Hospitalist has suggested to start on p.o. medication and diet however as patient is to be transferred at [facility] for possible surgery I will hold off on any p.o. medication or diet at this point and continue with esmolol drip and also Cardene drip if needed -Thiamine and folic acid -CIWA protocol -Nicotine patch -N.p.o. for now -Hold heparin or Lovenox  Patient was accepted by vascular surgery team Dr. Durwin Nora, patient will be transferred to [facility] for further management  Discharge Exam: Vitals:   08/25/18 1500 08/25/18 1542  BP: 123/75 117/62  Pulse: 77   Resp: 10   Temp:    SpO2: 97% 98%   Vitals:   08/25/18 1400 08/25/18 1430 08/25/18 1500 08/25/18 1542  BP: 109/72 110/71 123/75 117/62  Pulse: 64 64 77   Resp: 10 11 10    Temp: (!) 97.5 F (36.4 C)     TempSrc: Oral     SpO2: 96% 97% 97% 98%  Weight:      Height:        General: Pt is alert, awake, not in acute distress Cardiovascular: RRR, S1/S2 +, no rubs, no gallops Respiratory: CTA bilaterally, no wheezing, no rhonchi Abdominal: Soft, NT, ND, bowel sounds + Extremities: no edema, no cyanosis    The results of significant diagnostics from this hospitalization (including imaging, microbiology, ancillary and laboratory) are listed below for reference.     Microbiology: Recent Results (from the past 240 hour(s))  MRSA PCR Screening     Status: None   Collection Time: 08/25/18  6:37 AM  Result Value Ref Range Status   MRSA by PCR NEGATIVE NEGATIVE Final    Comment:        The GeneXpert MRSA Assay (FDA approved for NASAL specimens only), is one component of a comprehensive MRSA colonization surveillance program. It is not intended to diagnose MRSA infection nor to guide or monitor treatment for MRSA infections. Performed at [facility], [address] [place], [place] [address]      Labs: BNP (last 3 results) No results for input(s): BNP in the last 8760 hours. Basic Metabolic Panel: Recent Labs  Lab 08/24/18 2208 08/25/18 0716  NA 132* 131*  K 3.9 4.3  CL 95* 98  CO2 26 22  GLUCOSE 133* 124*  BUN 13 14  CREATININE 1.17 1.31*  CALCIUM 9.3 8.5*  MG  --  1.5*  PHOS  --  3.4   Liver Function Tests: Recent Labs  Lab 08/24/18 2208  AST 23  ALT 14  ALKPHOS 78  BILITOT  0.6  PROT 7.2  ALBUMIN 4.2   Recent Labs  Lab 08/24/18 2208  LIPASE 16   No results for input(s): AMMONIA in the last 168  hours. CBC: Recent Labs  Lab 08/24/18 2208  WBC 10.9*  HGB 12.6*  HCT 36.8*  MCV 96.1  PLT 130*   Cardiac Enzymes: Recent Labs  Lab 08/24/18 2208 08/25/18 0404 08/25/18 0716  TROPONINI 0.04* 0.03* 0.04*   BNP: Invalid input(s): POCBNP CBG: Recent Labs  Lab 08/25/18 0637  GLUCAP 142*   D-Dimer No results for input(s): DDIMER in the last 72 hours. Hgb A1c Recent Labs    08/25/18 0716  HGBA1C 5.5   Lipid Profile Recent Labs    08/25/18 0716  CHOL 134  HDL 56  LDLCALC 70  TRIG 39  CHOLHDL 2.4   Thyroid function studies No results for input(s): TSH, T4TOTAL, T3FREE, THYROIDAB in the last 72 hours.  Invalid input(s): FREET3 Anemia work up No results for input(s): VITAMINB12, FOLATE, FERRITIN, TIBC, IRON, RETICCTPCT in the last 72 hours. Urinalysis    Component Value Date/Time   COLORURINE Yellow 03/05/2014 1246   COLORURINE lt. yellow 11/30/2010 1511   APPEARANCEUR Clear 03/05/2014 1246   LABSPEC 1.004 03/05/2014 1246   PHURINE 5.0 03/05/2014 1246   PHURINE 5.5 11/30/2010 1511   GLUCOSEU Negative 03/05/2014 1246   HGBUR 1+ 03/05/2014 1246   HGBUR negative 11/30/2010 1511   BILIRUBINUR Negative 03/05/2014 1246   KETONESUR Negative 03/05/2014 1246   KETONESUR NEGATIVE 11/16/2007 1609   PROTEINUR Negative 03/05/2014 1246   PROTEINUR 100 (A) 11/16/2007 1609   UROBILINOGEN 1.0 11/30/2010 1511   NITRITE Negative 03/05/2014 1246   NITRITE negative 11/30/2010 1511   LEUKOCYTESUR 2+ 03/05/2014 1246   Sepsis Labs Invalid input(s): PROCALCITONIN,  WBC,  LACTICIDVEN Microbiology Recent Results (from the past 240 hour(s))  MRSA PCR Screening     Status: None   Collection Time: 08/25/18  6:37 AM  Result Value Ref Range Status   MRSA by PCR NEGATIVE NEGATIVE Final    Comment:        The GeneXpert MRSA Assay (FDA approved for NASAL specimens only), is one component of a comprehensive MRSA colonization surveillance program. It is not intended to  diagnose MRSA infection nor to guide or monitor treatment for MRSA infections. Performed at [facility], [address] [place], [place] [address]      Time coordinating discharge:  I have spent 35 minutes face to face with the patient and on the ward discussing the patients care, assessment, plan and disposition with other care givers. >50% of the time was devoted counseling the patient about the risks and benefits of treatment/Discharge disposition and coordinating care.   SIGNED:   Roseanne Reno, MD  08/25/2018, 3:48 PM Pager

## 2018-08-25 NOTE — Progress Notes (Signed)
Pt. Awoke approximately 22:45 on 08/25/18, removed monitors, removed both IV's, stated he was going home. Pt. counseled against leaving, charge nurse called. Charge nurse, primary nurse and security discussed with patient he would need to fill out AMA paperwork in order to leave, and that due to his blood pressure and heart issues, we advised against him leaving. Pt. Remained persistent he wanted to leave. Spoke with wife on phone. Escorted out by security at [phone].

## 2018-08-25 NOTE — Progress Notes (Signed)
Collie Siad notified of code 6. No other orders entered.

## 2018-08-25 NOTE — Progress Notes (Signed)
Called [facility] at [phone]. Pt is going to be transferred to 2[facility] Rm 23. Report given to [facility]. Pt alert and orient, on room air, no pain. BP 128/84, HR 74. On esmolol drip at 200 mcgs/kg/min, D5 half NS at 50 ml/hr. Pt NPO, using urinal. Replace Magnesium with 4 gram. Dr. Thelma Barge notified of Pt room number. Called [facility], ETA 20 minutes unless emergency. Dr. Sherryll Burger notified.

## 2018-08-25 NOTE — Progress Notes (Signed)
While pt being escorted out by security pt walking through ED lobby in hospital gown. Pt remained on hospital property with security staff and requested to come back to the hospital. Call placed to charge nurse Molly Maduro RN on 2[facility] to inform him of patient request to go back, current state of disorientation, and safety concerns. After discussion he states that after speaking with [facility] he must check in through the ED to be readmitted to the hospital. Pt informed of decision and states that he would rather go home. Pt wheeled via wheelchair to bus stop per pt request by security.

## 2018-08-25 NOTE — ED Notes (Signed)
Pain meds given.  Sinus tach on monitor.

## 2018-08-25 NOTE — Care Management (Signed)
RNCM at [facility] did not get at chance to see patient for medication assistance. He has transferred to [facility] hospital.

## 2018-08-25 NOTE — Progress Notes (Signed)
*  PRELIMINARY RESULTS* Echocardiogram 2D Echocardiogram has been performed.  [name] 08/25/2018, 12:04 PM

## 2018-08-25 NOTE — ED Notes (Signed)
2nd iv started   md in with pt again. meds and iv fluids started.  Pt to ct scan

## 2018-08-27 ENCOUNTER — Encounter (HOSPITAL_COMMUNITY): Payer: Self-pay | Admitting: Emergency Medicine

## 2018-08-27 ENCOUNTER — Emergency Department (HOSPITAL_COMMUNITY): Payer: Medicaid Other

## 2018-08-27 ENCOUNTER — Inpatient Hospital Stay (HOSPITAL_COMMUNITY)
Admission: EM | Admit: 2018-08-27 | Discharge: 2018-08-30 | DRG: 305 | Disposition: A | Discharge status: Home / Self Care | Payer: Medicaid Other | Attending: Internal Medicine | Admitting: Internal Medicine

## 2018-08-27 ENCOUNTER — Other Ambulatory Visit: Payer: Self-pay

## 2018-08-27 DIAGNOSIS — I161 Hypertensive emergency: Secondary | ICD-10-CM | POA: Diagnosis present

## 2018-08-27 DIAGNOSIS — I160 Hypertensive urgency: Secondary | ICD-10-CM | POA: Diagnosis present

## 2018-08-27 DIAGNOSIS — E876 Hypokalemia: Secondary | ICD-10-CM | POA: Diagnosis present

## 2018-08-27 DIAGNOSIS — R000 Tachycardia, unspecified: Secondary | ICD-10-CM | POA: Diagnosis present

## 2018-08-27 DIAGNOSIS — R0781 Pleurodynia: Secondary | ICD-10-CM | POA: Diagnosis not present

## 2018-08-27 DIAGNOSIS — N179 Acute kidney failure, unspecified: Secondary | ICD-10-CM | POA: Diagnosis not present

## 2018-08-27 DIAGNOSIS — R402143 Coma scale, eyes open, spontaneous, at hospital admission: Secondary | ICD-10-CM | POA: Diagnosis present

## 2018-08-27 DIAGNOSIS — Z7982 Long term (current) use of aspirin: Secondary | ICD-10-CM

## 2018-08-27 DIAGNOSIS — W010XXA Fall on same level from slipping, tripping and stumbling without subsequent striking against object, initial encounter: Secondary | ICD-10-CM | POA: Diagnosis present

## 2018-08-27 DIAGNOSIS — R402363 Coma scale, best motor response, obeys commands, at hospital admission: Secondary | ICD-10-CM | POA: Diagnosis present

## 2018-08-27 DIAGNOSIS — I10 Essential (primary) hypertension: Secondary | ICD-10-CM | POA: Diagnosis present

## 2018-08-27 DIAGNOSIS — I723 Aneurysm of iliac artery: Secondary | ICD-10-CM | POA: Diagnosis not present

## 2018-08-27 DIAGNOSIS — I719 Aortic aneurysm of unspecified site, without rupture: Secondary | ICD-10-CM | POA: Diagnosis not present

## 2018-08-27 DIAGNOSIS — R072 Precordial pain: Secondary | ICD-10-CM | POA: Diagnosis present

## 2018-08-27 DIAGNOSIS — R402253 Coma scale, best verbal response, oriented, at hospital admission: Secondary | ICD-10-CM | POA: Diagnosis present

## 2018-08-27 DIAGNOSIS — Z79899 Other long term (current) drug therapy: Secondary | ICD-10-CM | POA: Diagnosis not present

## 2018-08-27 DIAGNOSIS — I513 Intracardiac thrombosis, not elsewhere classified: Secondary | ICD-10-CM | POA: Diagnosis present

## 2018-08-27 DIAGNOSIS — R05 Cough: Secondary | ICD-10-CM | POA: Diagnosis not present

## 2018-08-27 DIAGNOSIS — R0789 Other chest pain: Secondary | ICD-10-CM | POA: Diagnosis not present

## 2018-08-27 DIAGNOSIS — M1990 Unspecified osteoarthritis, unspecified site: Secondary | ICD-10-CM | POA: Diagnosis present

## 2018-08-27 DIAGNOSIS — F1010 Alcohol abuse, uncomplicated: Secondary | ICD-10-CM | POA: Diagnosis present

## 2018-08-27 DIAGNOSIS — R42 Dizziness and giddiness: Secondary | ICD-10-CM | POA: Diagnosis not present

## 2018-08-27 DIAGNOSIS — Z9114 Patient's other noncompliance with medication regimen: Secondary | ICD-10-CM | POA: Diagnosis not present

## 2018-08-27 DIAGNOSIS — Z23 Encounter for immunization: Secondary | ICD-10-CM | POA: Diagnosis not present

## 2018-08-27 DIAGNOSIS — Z885 Allergy status to narcotic agent status: Secondary | ICD-10-CM

## 2018-08-27 DIAGNOSIS — M549 Dorsalgia, unspecified: Secondary | ICD-10-CM | POA: Diagnosis present

## 2018-08-27 DIAGNOSIS — I712 Thoracic aortic aneurysm, without rupture: Secondary | ICD-10-CM | POA: Diagnosis not present

## 2018-08-27 DIAGNOSIS — R079 Chest pain, unspecified: Secondary | ICD-10-CM | POA: Diagnosis not present

## 2018-08-27 DIAGNOSIS — F17210 Nicotine dependence, cigarettes, uncomplicated: Secondary | ICD-10-CM | POA: Diagnosis present

## 2018-08-27 DIAGNOSIS — G8929 Other chronic pain: Secondary | ICD-10-CM | POA: Diagnosis present

## 2018-08-27 DIAGNOSIS — I959 Hypotension, unspecified: Secondary | ICD-10-CM | POA: Diagnosis not present

## 2018-08-27 LAB — CBC WITH DIFFERENTIAL/PLATELET
Abs Immature Granulocytes: 0.03 10*3/uL (ref 0.00–0.07)
Basophils Absolute: 0 10*3/uL (ref 0.0–0.1)
Basophils Relative: 1 %
EOS ABS: 0.4 10*3/uL (ref 0.0–0.5)
EOS PCT: 6 %
HEMATOCRIT: 39.3 % (ref 39.0–52.0)
Hemoglobin: 13.4 g/dL (ref 13.0–17.0)
Immature Granulocytes: 0 %
LYMPHS ABS: 1 10*3/uL (ref 0.7–4.0)
Lymphocytes Relative: 13 %
MCH: 32.4 pg (ref 26.0–34.0)
MCHC: 34.1 g/dL (ref 30.0–36.0)
MCV: 95.2 fL (ref 80.0–100.0)
MONO ABS: 0.8 10*3/uL (ref 0.1–1.0)
MONOS PCT: 10 %
Neutro Abs: 5.1 10*3/uL (ref 1.7–7.7)
Neutrophils Relative %: 70 %
PLATELETS: 119 10*3/uL — AB (ref 150–400)
RBC: 4.13 MIL/uL — ABNORMAL LOW (ref 4.22–5.81)
RDW: 14.7 % (ref 11.5–15.5)
WBC: 7.4 10*3/uL (ref 4.0–10.5)
nRBC: 0 % (ref 0.0–0.2)

## 2018-08-27 LAB — I-STAT TROPONIN, ED: TROPONIN I, POC: 0 ng/mL (ref 0.00–0.08)

## 2018-08-27 LAB — APTT: aPTT: 33 seconds (ref 24–36)

## 2018-08-27 LAB — I-STAT CHEM 8, ED
BUN: 6 mg/dL (ref 6–20)
CALCIUM ION: 1.07 mmol/L — AB (ref 1.15–1.40)
Chloride: 96 mmol/L — ABNORMAL LOW (ref 98–111)
Creatinine, Ser: 1.2 mg/dL (ref 0.61–1.24)
Glucose, Bld: 111 mg/dL — ABNORMAL HIGH (ref 70–99)
HEMATOCRIT: 41 % (ref 39.0–52.0)
HEMOGLOBIN: 13.9 g/dL (ref 13.0–17.0)
Potassium: 3.3 mmol/L — ABNORMAL LOW (ref 3.5–5.1)
SODIUM: 131 mmol/L — AB (ref 135–145)
TCO2: 23 mmol/L (ref 22–32)

## 2018-08-27 LAB — BASIC METABOLIC PANEL
ANION GAP: 14 (ref 5–15)
BUN: 7 mg/dL (ref 6–20)
CALCIUM: 9.5 mg/dL (ref 8.9–10.3)
CO2: 24 mmol/L (ref 22–32)
Chloride: 95 mmol/L — ABNORMAL LOW (ref 98–111)
Creatinine, Ser: 1.23 mg/dL (ref 0.61–1.24)
GLUCOSE: 115 mg/dL — AB (ref 70–99)
Potassium: 3.4 mmol/L — ABNORMAL LOW (ref 3.5–5.1)
SODIUM: 133 mmol/L — AB (ref 135–145)

## 2018-08-27 LAB — PROTIME-INR
INR: 0.95
Prothrombin Time: 12.6 seconds (ref 11.4–15.2)

## 2018-08-27 LAB — I-STAT CG4 LACTIC ACID, ED: LACTIC ACID, VENOUS: 1.42 mmol/L (ref 0.5–1.9)

## 2018-08-27 MED ORDER — ONDANSETRON HCL 4 MG/2ML IJ SOLN: 4.0000 mg | Freq: Four times a day (QID) | INTRAMUSCULAR | Status: DC | PRN

## 2018-08-27 MED ORDER — CARVEDILOL 6.25 MG PO TABS
6.2500 mg | ORAL_TABLET | Freq: Two times a day (BID) | ORAL | Status: DC
  Administered 2018-08-28 – 2018-08-30 (×5): 6.25 mg via ORAL
  Filled 2018-08-27 (×5): qty 1

## 2018-08-27 MED ORDER — ESMOLOL HCL-SODIUM CHLORIDE 2000 MG/100ML IV SOLN
25.0000 ug/kg/min | Freq: Once | INTRAVENOUS | Status: DC
  Filled 2018-08-27: qty 100

## 2018-08-27 MED ORDER — PNEUMOCOCCAL VAC POLYVALENT 25 MCG/0.5ML IJ INJ
0.5000 mL | INJECTION | INTRAMUSCULAR | Status: AC
  Administered 2018-08-29: 0.5 mL via INTRAMUSCULAR
  Filled 2018-08-27: qty 0.5

## 2018-08-27 MED ORDER — POTASSIUM CHLORIDE CRYS ER 20 MEQ PO TBCR: 40.0000 meq | EXTENDED_RELEASE_TABLET | Freq: Once | ORAL | Status: DC

## 2018-08-27 MED ORDER — LABETALOL HCL 5 MG/ML IV SOLN: 10.0000 mg | Freq: Once | INTRAVENOUS | Status: DC

## 2018-08-27 MED ORDER — ACETAMINOPHEN 325 MG PO TABS
650.0000 mg | ORAL_TABLET | ORAL | Status: DC | PRN
  Administered 2018-08-29: 650 mg via ORAL
  Filled 2018-08-27: qty 2

## 2018-08-27 MED ORDER — BISACODYL 5 MG PO TBEC: 5.0000 mg | DELAYED_RELEASE_TABLET | Freq: Every day | ORAL | Status: DC | PRN

## 2018-08-27 MED ORDER — LABETALOL HCL 5 MG/ML IV SOLN
5.0000 mg | INTRAVENOUS | Status: AC
  Administered 2018-08-27: 5 mg via INTRAVENOUS
  Filled 2018-08-27: qty 4

## 2018-08-27 MED ORDER — AMLODIPINE BESYLATE 5 MG PO TABS
5.0000 mg | ORAL_TABLET | Freq: Every day | ORAL | Status: DC
  Administered 2018-08-27 – 2018-08-28 (×2): 5 mg via ORAL
  Filled 2018-08-27 (×2): qty 1

## 2018-08-27 MED ORDER — SENNOSIDES-DOCUSATE SODIUM 8.6-50 MG PO TABS: 1.0000 | ORAL_TABLET | Freq: Every evening | ORAL | Status: DC | PRN

## 2018-08-27 MED ORDER — INFLUENZA VAC SPLIT QUAD 0.5 ML IM SUSY: 0.5000 mL | PREFILLED_SYRINGE | INTRAMUSCULAR | Status: DC

## 2018-08-27 MED ORDER — FOLIC ACID 1 MG PO TABS
1.0000 mg | ORAL_TABLET | Freq: Every day | ORAL | Status: DC
  Administered 2018-08-28 – 2018-08-30 (×3): 1 mg via ORAL
  Filled 2018-08-27 (×3): qty 1

## 2018-08-27 MED ORDER — LABETALOL HCL 5 MG/ML IV SOLN
20.0000 mg | INTRAVENOUS | Status: AC
  Administered 2018-08-27: 20 mg via INTRAVENOUS
  Filled 2018-08-27: qty 4

## 2018-08-27 MED ORDER — ADULT MULTIVITAMIN W/MINERALS CH
1.0000 | ORAL_TABLET | Freq: Every day | ORAL | Status: DC
  Administered 2018-08-28 – 2018-08-30 (×3): 1 via ORAL
  Filled 2018-08-27 (×3): qty 1

## 2018-08-27 MED ORDER — VITAMIN B-1 100 MG PO TABS
100.0000 mg | ORAL_TABLET | Freq: Every day | ORAL | Status: DC
  Administered 2018-08-28 – 2018-08-30 (×3): 100 mg via ORAL
  Filled 2018-08-27 (×3): qty 1

## 2018-08-27 MED ORDER — ENOXAPARIN SODIUM 40 MG/0.4ML ~~LOC~~ SOLN
40.0000 mg | SUBCUTANEOUS | Status: DC
  Administered 2018-08-27 – 2018-08-29 (×3): 40 mg via SUBCUTANEOUS
  Filled 2018-08-27 (×3): qty 0.4

## 2018-08-27 MED ORDER — LABETALOL HCL 5 MG/ML IV SOLN
0.5000 mg/min | INTRAVENOUS | Status: DC
  Administered 2018-08-27: 0.5 mg/min via INTRAVENOUS
  Filled 2018-08-27: qty 100

## 2018-08-27 MED ORDER — LORAZEPAM 2 MG/ML IJ SOLN: 2.0000 mg | INTRAMUSCULAR | Status: DC | PRN

## 2018-08-27 MED ORDER — NITROGLYCERIN IN D5W 200-5 MCG/ML-% IV SOLN: 0.0000 ug/min | Freq: Once | INTRAVENOUS | Status: DC

## 2018-08-27 NOTE — ED Provider Notes (Signed)
[facility] EMERGENCY DEPARTMENT Provider Note   CSN: [id number] Arrival date & time: 08/27/18  1448  History   Chief Complaint Chief Complaint  Patient presents with  . Chest Pain   HPI  Patient is a 62 year old male with history of HTN and alcohol abuse presenting to the ED for chest pain.  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  He was seen here by cardiothoracic surgery and was admitted to vascular surgery for close BP management.  He left AMA from the hospital 2 days ago in the middle the night.  Patient states that he woke this morning with central nonradiating chest pain which was initially severe.  He called EMS and was given sublingual NTG with systolic BP dropped from 200s to 90s.  He was also given ASA.  On arrival here, his chest pain has nearly resolved.  He denies any associated dyspnea, syncope, vomiting, or other recent illness.  When asked about leaving AMA, he states that he has no recollection of his hospitalization from time of admission until waking up this morning.  He does state that he is a daily drinker is not sure of his last alcohol use.  No numbness, weakness, or changes in speech.  Past Medical History:  Diagnosis Date  . Hypertension     Patient Active Problem List   Diagnosis Date Noted  . Hypertensive urgency 08/27/2018  . 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 Penetrating atherosclerotic ulcer of aorta (HCC)   . LOW BACK PAIN, CHRONIC 11/30/2010  . DEPRESSION 03/18/2008  . PANCREATITIS, CHRONIC 03/18/2008  . RESTLESS LEG SYNDROME 06/18/2007  . PENILE DISCHARGE 06/18/2007  . ALCOHOLISM 01/30/2007  . TOBACCO ABUSE 01/30/2007  . HYPERTENSION 01/30/2007  . COPD 01/30/2007  . CARDIOMYOPATHY 05/30/1998    History reviewed. No pertinent surgical  history.      Home Medications    Prior to Admission medications   Medication Sig Start Date End Date Taking? Authorizing Provider  aspirin 81 MG chewable tablet Chew 1 tablet (81 mg total) by mouth daily. Patient not taking: Reported on 08/27/2018 08/26/18   Roseanne Reno, MD  Dextrose-Sodium Chloride (DEXTROSE 5 % AND 0.45% NACL) infusion Inject 50 mL/hr into the vein continuous. Patient not taking: Reported on 08/27/2018 08/25/18   Roseanne Reno, MD  esmolol (BREVIBLOC) 2000 mg / 100 mL infusion Inject 1.5875-19.05 mg/min into the vein continuous. Patient not taking: Reported on 08/27/2018 08/25/18   Roseanne Reno, MD  famotidine (PEPCID) 20-0.9 MG/50ML-% Inject 50 mLs (20 mg total) into the vein every 12 (twelve) hours. Patient not taking: Reported on 08/27/2018 08/25/18   Roseanne Reno, MD  LORazepam (ATIVAN) 1 MG tablet Take 1 tablet (1 mg total) by mouth every 6 (six) hours as needed (CIWA-AR > 8  -OR-  withdrawal symptoms:  anxiety, agitation, insomnia, diaphoresis, nausea, vomiting, tremors, tachycardia, or hypertension.). Patient not taking: Reported on 08/27/2018 08/25/18   Roseanne Reno, MD  Multiple Vitamin (MULTIVITAMIN WITH MINERALS) TABS tablet Take 1 tablet by mouth daily. Patient not taking: Reported on 08/27/2018 08/26/18   Roseanne Reno, MD  niCARdipine in saline (CARDENE-IV) 20-0.86 MG/200ML-% SOLN Inject 3-15 mg/hr into the vein continuous. Patient not taking: Reported on 08/27/2018 08/25/18   Roseanne Reno, MD  nicotine (NICODERM CQ - DOSED IN MG/24 HOURS) 21 mg/24hr patch Place 1 patch (  21 mg total) onto the skin daily. Patient not taking: Reported on 08/27/2018 08/25/18   Roseanne Reno, MD  thiamine (B-1) 100 MG/ML injection Inject 1 mL (100 mg total) into the vein daily. Patient not taking: Reported on 08/27/2018 08/26/18   Roseanne Reno, MD    Family History History reviewed. No pertinent family history.  Social History Social History   Tobacco Use  . Smoking status:  Current Every Day Smoker    Packs/day: 1.00  . Smokeless tobacco: Never Used  Substance Use Topics  . Alcohol use: Yes    Alcohol/week: 18.0 standard drinks    Types: 18 Cans of beer per week  . Drug use: Yes    Types: Marijuana     Allergies   Codeine phosphate   Review of Systems Review of Systems  Constitutional: Negative for fever.  HENT: Negative for congestion.   Eyes: Negative for visual disturbance.  Respiratory: Negative for cough and shortness of breath.   Cardiovascular: Positive for chest pain.  Gastrointestinal: Negative for abdominal pain, diarrhea and vomiting.  Genitourinary: Negative for dysuria.  Musculoskeletal: Negative for back pain.  Skin: Negative for rash.  Neurological: Negative for speech difficulty, weakness, numbness and headaches.  All other systems reviewed and are negative.    Physical Exam Updated Vital Signs BP 122/79   Pulse 71   Temp 98.2 F (36.8 C) (Oral)   Resp 17   Ht 5\' 11"  (1.803 m)   Wt 64.9 kg   SpO2 98%   BMI 19.96 kg/m   Physical Exam  Constitutional: He is oriented to person, place, and time. No distress.  HENT:  Head: Normocephalic and atraumatic.  Mouth/Throat: Oropharynx is clear and moist.  Eyes: Pupils are equal, round, and reactive to light. EOM are normal.  Neck: Neck supple. No JVD present.  Cardiovascular: Normal rate and intact distal pulses.  Murmur heard.  Systolic murmur is present with a grade of 1/6. Pulmonary/Chest: Breath sounds normal. No respiratory distress. He has no wheezes. He has no rales.  Abdominal: Soft. He exhibits no distension and no mass. There is no tenderness. There is no guarding.  Musculoskeletal: Normal range of motion. He exhibits no edema.  Neurological: He is alert and oriented to person, place, and time.  Speech is fluent.  No facial asymmetry.  Tongue protrusion is midline.  Symmetric palate elevation.  5/5 strength in all extremities with intact sensation to light touch  throughout.   Skin: Skin is warm and dry.  Psychiatric: He has a normal mood and affect. His behavior is normal.  Nursing note and vitals reviewed.    ED Treatments / Results  Labs (all labs ordered are listed, but only abnormal results are displayed) Labs Reviewed  CBC WITH DIFFERENTIAL/PLATELET - Abnormal; Notable for the following components:      Result Value   RBC 4.13 (*)    Platelets 119 (*)    All other components within normal limits  I-STAT CHEM 8, ED - Abnormal; Notable for the following components:   Sodium 131 (*)    Potassium 3.3 (*)    Chloride 96 (*)    Glucose, Bld 111 (*)    Calcium, Ion 1.07 (*)    All other components within normal limits  APTT  PROTIME-INR  CBC  BASIC METABOLIC PANEL  BASIC METABOLIC PANEL  HIV ANTIBODY (ROUTINE TESTING W REFLEX)  I-STAT CG4 LACTIC ACID, ED  I-STAT TROPONIN, ED  I-STAT CG4 LACTIC ACID, ED  I-STAT TROPONIN, ED  EKG EKG Interpretation  Date/Time:  Thursday August 27 2018 14:54:48 EDT Ventricular Rate:  117 PR Interval:    QRS Duration: 91 QT Interval:  291 QTC Calculation: 406 R Axis:   55 Text Interpretation:  Sinus tachycardia LAE, consider biatrial enlargement Repol abnrm suggests ischemia, anterolateral No significant change since last tracing Confirmed by Jacalyn Lefevre [id number]) on 08/27/2018 2:57:30 PM   Radiology Dg Chest Portable 1 View  Result Date: 08/27/2018 CLINICAL DATA:  Chest pain. EXAM: PORTABLE CHEST 1 VIEW COMPARISON:  Radiographs of August 24, 2018. CT scan of August 25, 2018. FINDINGS: Cardiac size is within normal limits. Stable enlargement of aortic arch and proximal descending thoracic aorta is noted consistent with intramural hematoma as described on prior CT scan. Both lungs are clear. No pneumothorax or pleural effusion is noted. The visualized skeletal structures are unremarkable. IMPRESSION: Stable enlargement of aortic arch and proximal descending thoracic aorta is noted  consistent with history of intramural hematoma. No other abnormality is noted. Electronically Signed   By: Lupita Raider, M.D.   On: 08/27/2018 15:42    Procedures Procedures (including critical care time)   Initial Impression / Assessment and Plan / ED Course  I have reviewed the triage vital signs and the nursing notes.  Pertinent labs & imaging results that were available during my care of the patient were reviewed by me and considered in my medical decision making (see chart for details).  Patient is a 62 year old male with history of HTN and alcohol abuse presenting to the ED for chest pain as above. Clinical picture consistent with recent admission for penetrating aortic ulcer. Pain rapidly resolved after arrival here. HDS, well-perfused distally. CXR shows stable enlargement of aortic arch. Severely hypertensive and mildly tachycardic here for which we gave initial IV labetalol. Initial troponin negative and EKG without STEMI.  Vascular surgery Dr. Chestine Spore was consulted who evaluated patient in ED. Given absence of current symptoms, he recommended admission to medicine for BP control and they will follow. They recommended sBP < 140 and HR < 80. They did not feel CTA was warranted in absence of ongoing discomfort.  Initially ordered esmolol and NTG infusion for further hemodynamic control. Critical care was consulted and they recommended IV labetalol infusion which was ordered. Admitted to ICU for further management.   Final Clinical Impressions(s) / ED Diagnoses   Final diagnoses:  Severe hypertension  Penetrating ulcer of aorta (HCC)     Cecille Po, MD 08/27/18 2252    Jacalyn Lefevre, MD 08/27/18 2300

## 2018-08-27 NOTE — H&P (Signed)
NAME:  [name], MRN:  [id number], DOB:  [birth year], LOS: 0 ADMISSION DATE:  08/27/2018, CONSULTATION DATE:  10/17 REFERRING MD:  Dr. 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 r  Past Medical History  Alcohol and tobacco abuse, hypertension, arthritis  Significant Hospital Events   10/15 admit for penetrating ulce of descending aorta, left AMA 10/17 came back for treatment.   Consults: date of consult/date signed off & final recs:  Vascular surgery 10/17 >  Procedures (surgical and bedside):    Significant Diagnostic Tests:  CT angiogram chest 10/15 > Intramural hematoma within the distal aortic arch extending from downstream to the left subclavian artery to the midthoracic level. 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 No evidence of dissection flap, aortic aneurysm, or additional acute vascular abnormality. Widely patent great vessel origins. 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  Micro Data:    Antimicrobials:     Subjective:  Denies chest pain, headache, blurry vision, and nausea.   Objective   Blood pressure (!) 192/143, pulse 98, resp. rate 17, SpO2 99 %.       No intake or output data in the 24 hours ending 08/27/18 1733 There were no vitals filed for this visit.  Examination: General: Disheveled middle aged male in NAD HENT: /AT, PERRL, no JVD Lungs: Clear bilateral Cardiovascular: Tachy, regular, no MRG Abdomen: Soft, non-tender, non-distended Extremities: No acute deformity, ROM limitation, or edema Neuro: Alert, oriented, non-focal  Resolved Hospital Problem list     Assessment & Plan:   Penetrating aortic ulcer  in the setting of hypertensive crisis. Asymptomatic at present.  - Vascular surgery following, and recommending BP control with SBP goal  < 140 and HR goal < 80.  - Give 20mg  labetalol now and labetalol infusion being started.  - Will need to get oral agents started once with get BP under control.  - DC CTA chest, just had one 2 days ago and is asymptomatic at this time.  - Admit to ICU for hemodynamic monitoring  Alcohol abuse 12 beers per day.  - CIWA protocol with Ativan and PRN haldol.  - Thiamine, folate  Hypokalemia -replete K  Tobacco abuse - Does not describe a readiness to quit.  - Declines nicotine patch.   Disposition / Summary of Today's Plan 08/27/18   Admit to ICU on labetalol infusion     Diet: heart healthy Pain/Anxiety/Delirium protocol (if indicated): CIWA VAP protocol (if indicated): n/a DVT prophylaxis: SCDs GI prophylaxis: N/a Hyperglycemia protocol: n/a Mobility: bedrest for tonight Code Status: Full Family Communication: Patient and sister updated. He does have wife, but she is not present.   Labs   CBC: Recent Labs  Lab 08/24/18 2208 08/25/18 2014 08/27/18 1543 08/27/18 1555  WBC 10.9* 6.9 7.4  --   NEUTROABS  --   --  5.1  --   HGB 12.6* 11.9* 13.4 13.9  HCT 36.8* 35.7* 39.3 41.0  MCV 96.1 97.0 95.2  --   PLT 130* 125* 119*  --     Basic Metabolic Panel: Recent Labs  Lab 08/24/18 2208 08/25/18 0716 08/25/18 2014 08/27/18 1555  NA 132* 131* 133* 131*  K 3.9 4.3 4.0 3.3*  CL 95* 98 101 96*  CO2 26 22 25   --   GLUCOSE 133* 124* 122* 111*  BUN 13 14 11 6   CREATININE 1.17 1.31* 1.32* 1.20  CALCIUM 9.3 8.5* 8.9  --   MG  --  1.5*  --   --   PHOS  --  3.4  --   --    GFR: Estimated Creatinine Clearance: 58.8 mL/min (by C-G formula based on SCr of 1.2 mg/dL). Recent Labs  Lab 08/24/18 2208 08/25/18 2014 08/27/18 1543 08/27/18 1555  WBC 10.9* 6.9 7.4  --   LATICACIDVEN  --   --   --  1.42    Liver Function Tests: Recent Labs  Lab 08/24/18 2208 08/25/18 2014  AST 23 21  ALT 14 14  ALKPHOS 78 75  BILITOT 0.6 0.9  PROT 7.2 6.6  ALBUMIN 4.2 3.5    Recent Labs  Lab 08/24/18 2208  LIPASE 16   No results for input(s): AMMONIA in the last 168 hours.  ABG    Component Value Date/Time   HCO3 23.5 07/19/2007 0359   TCO2 23 08/27/2018 1555   ACIDBASEDEF 1.0 07/18/2007 1853     Coagulation Profile: Recent Labs  Lab 08/25/18 0252 08/25/18 2014 08/27/18 1543  INR 1.02 1.06 0.95    Cardiac Enzymes: Recent Labs  Lab 08/24/18 2208 08/25/18 0404 08/25/18 0716  TROPONINI 0.04* 0.03* 0.04*    HbA1C: Hgb A1c MFr Bld  Date/Time Value Ref Range Status  08/25/2018 07:16 AM 5.5 4.8 - 5.6 % Final    Comment:    (NOTE) Pre diabetes:          5.7%-6.4% Diabetes:              >6.4% Glycemic control for   <7.0% adults with diabetes     CBG: Recent Labs  Lab 08/25/18 0637  GLUCAP 142*    Admitting History of Present Illness.   62 year old male with PMH significant for HTN, ETOH abuse, and tobacco abuse. He was on clonidine for several years, but stopped taking when he ran out of scripts and money. He drinks about 12 beers per day. Presented to [facility] ED 10/15 with complaints of excruciating chest pain with exertion. CT angio of the cehst demonstrated a penetrating ulcer in the distal aortic arch. He was profoundly hypertensive and was transferred to [facility] for BP control. His mental status became altered and he left AMA. He does not remember doing this. Once at home, he realized he should have stayed in the hospital for treatment, and has returned for admission 10/17.   Upon arrival to the ED he had no complaints, but remained profoundly hypertensive with SBP 230s. He was given 5mg  of labetalol in ED with minimal response. Vascular surgery was consulted and recommended medical management targeted at BP control. PCCM called for admission.   Review of Systems:   Bolds are positive  Constitutional: weight loss, gain, night sweats, Fevers, chills, fatigue .  HEENT: headaches, Sore throat, sneezing, nasal congestion, post  nasal drip, Difficulty swallowing, Tooth/dental problems, visual complaints visual changes, ear ache CV:  chest pain, radiates:,Orthopnea, PND, swelling in lower extremities, dizziness, palpitations, syncope.  GI  heartburn, indigestion, abdominal pain, nausea, vomiting, diarrhea, change in bowel habits, loss of appetite, bloody stools.  Resp: cough, productive:, hemoptysis, dyspnea, chest pain, pleuritic.  Skin: rash or itching or icterus GU: dysuria, change in color of urine, urgency or frequency. flank pain, hematuria  MS: joint pain or swelling. decreased range of motion  Psych: change in mood or affect. depression or anxiety.  Neuro: difficulty with  speech, weakness, numbness, ataxia    Past Medical History  He,  has a past medical history of Hypertension.   Surgical History   History reviewed. No pertinent surgical history.   Social History   Social History   Socioeconomic History  . Marital status: Married    Spouse name: Not on file  . Number of children: Not on file  . Years of education: Not on file  . Highest education level: Not on file  Occupational History  . Not on file  Social Needs  . Financial resource strain: Not on file  . Food insecurity:    Worry: Not on file    Inability: Not on file  . Transportation needs:    Medical: Not on file    Non-medical: Not on file  Tobacco Use  . Smoking status: Current Every Day Smoker    Packs/day: 1.00  . Smokeless tobacco: Never Used  Substance and Sexual Activity  . Alcohol use: Yes    Alcohol/week: 18.0 standard drinks    Types: 18 Cans of beer per week  . Drug use: Yes    Types: Marijuana  . Sexual activity: Not on file  Lifestyle  . Physical activity:    Days per week: Not on file    Minutes per session: Not on file  . Stress: Not on file  Relationships  . Social connections:    Talks on phone: Not on file    Gets together: Not on file    Attends religious service: Not on file    Active member of  club or organization: Not on file    Attends meetings of clubs or organizations: Not on file    Relationship status: Not on file  . Intimate partner violence:    Fear of current or ex partner: Not on file    Emotionally abused: Not on file    Physically abused: Not on file    Forced sexual activity: Not on file  Other Topics Concern  . Not on file  Social History Narrative  . Not on file  ,  reports that he has been smoking. He has been smoking about 1.00 pack per day. He has never used smokeless tobacco. He reports that he drinks about 18.0 standard drinks of alcohol per week. He reports that he has current or past drug history. Drug: Marijuana.   Family History   His family history is not on file.   Allergies Allergies  Allergen Reactions  . Codeine Phosphate     REACTION: unspecified     Home Medications  Prior to Admission medications   Medication Sig Start Date End Date Taking? Authorizing Provider  aspirin 81 MG chewable tablet Chew 1 tablet (81 mg total) by mouth daily. 08/26/18   Roseanne Reno, MD  Dextrose-Sodium Chloride (DEXTROSE 5 % AND 0.45% NACL) infusion Inject 50 mL/hr into the vein continuous. 08/25/18   Roseanne Reno, MD  esmolol (BREVIBLOC) 2000 mg / 100 mL infusion Inject 1.5875-19.05 mg/min into the vein continuous. 08/25/18   Roseanne Reno, MD  famotidine (PEPCID) 20-0.9 MG/50ML-% Inject 50 mLs (20 mg total) into the vein every 12 (twelve) hours. 08/25/18   Roseanne Reno, MD  LORazepam (ATIVAN) 1 MG tablet Take 1 tablet (1 mg total) by mouth every 6 (six) hours as needed (CIWA-AR > 8  -OR-  withdrawal symptoms:  anxiety, agitation, insomnia, diaphoresis, nausea, vomiting, tremors, tachycardia, or hypertension.). 08/25/18   Roseanne Reno, MD  Multiple Vitamin (MULTIVITAMIN WITH MINERALS) TABS  tablet Take 1 tablet by mouth daily. 08/26/18   Roseanne Reno, MD  niCARdipine in saline (CARDENE-IV) 20-0.86 MG/200ML-% SOLN Inject 3-15 mg/hr into the vein continuous. 08/25/18    Roseanne Reno, MD  nicotine (NICODERM CQ - DOSED IN MG/24 HOURS) 21 mg/24hr patch Place 1 patch (21 mg total) onto the skin daily. 08/25/18   Roseanne Reno, MD  thiamine (B-1) 100 MG/ML injection Inject 1 mL (100 mg total) into the vein daily. 08/26/18   Roseanne Reno, MD     Joneen Roach, AGACNP-BC [facility] Pager [phone] or [phone]  08/27/2018 5:33 PM

## 2018-08-27 NOTE — ED Notes (Signed)
Pt's family at bedside.  Waiting for CT.  No complaints voiced by pt

## 2018-08-27 NOTE — Progress Notes (Signed)
Pt transferred to 2H 25 from ED. Report received from ED RN. Pt transferred from stretcher to bed without incident, attached to cardiac monitor, on room air. Labetalol gtt infusing (see MAR). Pt reports no pain. Care assumed at this time.

## 2018-08-27 NOTE — ED Notes (Signed)
Vascular MD in to assess pt at this time

## 2018-08-27 NOTE — H&P (Signed)
Critical Care Attending  30 year man who presented recently with chest pain and was diagnosed with a penetrating ulcer of the descending aorta. He left AMA without antihypertensive therapy.   Presented again today with similar pain. BP noted to be >200. Patient denies headache, visual symptoms.  On my examination: SBP was by then down to 160. Chest clear, HS S4 positive. PP++ with no delay. No edema or JVD, abdomen soft. Neurologically intact with clear sensorium, no cranial deficits or limb weakness or sensory deficits.  ASSESSMENT:  Acute hypertensive crisis due to medication non-adherence. Acute BP control with IV labetalol. Initiate oral therapy. SW consult to assist with drug coverage.  Lynnell Catalan, MD [facility] ICU Physician [facility] [place] [facility]  Pager: [phone] Mobile: [phone] After hours: [phone].

## 2018-08-27 NOTE — Consult Note (Addendum)
Hospital Consult    Reason for Consult:  Ulcer descending thoracic aorta Requesting Physician:  ER MRN #:  [id number]  History of Present Illness: This is a 62 y.o. male who was admitted 2 days ago by Dr. Edilia Bo with Penetrating ulcer of descending thoracic aorta.  Per his note 2 days ago: admitted at [facility] yesterday evening with the onset of substernal chest pain.  He presented to the emergency department and work-up included a CT scan of the chest which showed a small penetrating ulcer in the proximal descending thoracic aorta just beyond the subclavian artery.  There was intramural hematoma extending from the left subclavian artery to the midportion of the thoracic aorta.  There is no evidence of dissection or extravasation.  Patient was admitted for blood pressure control and is currently pain-free.  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  Given his CT findings we were asked to accept the patient in transfer.  On my history, the patient developed the sudden onset of substernal chest pain at 6 AM yesterday.  This lasted only briefly.  He then developed recurrent pain at 6 PM which was fairly severe and persistent.  This is when he presented to the emergency department at [facility].  His blood pressure was brought under good control and this resolved.  He did have some pleuritic chest pain prior to admission.  At this point his chest pain has completely resolved.  He denies any history of claudication, rest pain, or nonhealing ulcers.  He denies any family history of aneurysmal disease.  He drinks at least a 12 pack of beer daily.  He smokes about a ppd.    Today: He presents back to the ED today stating that he came in because he "escaped" Tuesday night and realizes he shouldn't have left.  Upon being asked multiple times, he denies have any chest pain or new back pain. He states  he has chronic back pain but nothing different than that.  He is not on any blood pressure medications at home.  He does not take any medications at home.  His sister is present and states she talked to the pt's wife and was told the pt was a little short of breath and when he walked, he would stumble and fall.  Again, pt has denied having chest pain to myself and Dr. Chestine Spore multiple times.  Past Medical History:  Diagnosis Date  . Hypertension     History reviewed. No pertinent surgical history.  Allergies  Allergen Reactions  . Codeine Phosphate     REACTION: unspecified    Prior to Admission medications   Medication Sig Start Date End Date Taking? Authorizing Provider  aspirin 81 MG chewable tablet Chew 1 tablet (81 mg total) by mouth daily. 08/26/18   Roseanne Reno, MD  Dextrose-Sodium Chloride (DEXTROSE 5 % AND 0.45% NACL) infusion Inject 50 mL/hr into the vein continuous. 08/25/18   Roseanne Reno, MD  esmolol (BREVIBLOC) 2000 mg / 100 mL infusion Inject 1.5875-19.05 mg/min into the vein continuous. 08/25/18   Roseanne Reno, MD  famotidine (PEPCID) 20-0.9 MG/50ML-% Inject 50 mLs (20 mg total) into the vein every 12 (twelve) hours. 08/25/18   Roseanne Reno, MD  LORazepam (ATIVAN) 1 MG tablet Take 1 tablet (1 mg total) by mouth every 6 (six) hours as needed (CIWA-AR > 8  -OR-  withdrawal symptoms:  anxiety, agitation, insomnia,  diaphoresis, nausea, vomiting, tremors, tachycardia, or hypertension.). 08/25/18   Roseanne Reno, MD  Multiple Vitamin (MULTIVITAMIN WITH MINERALS) TABS tablet Take 1 tablet by mouth daily. 08/26/18   Roseanne Reno, MD  niCARdipine in saline (CARDENE-IV) 20-0.86 MG/200ML-% SOLN Inject 3-15 mg/hr into the vein continuous. 08/25/18   Roseanne Reno, MD  nicotine (NICODERM CQ - DOSED IN MG/24 HOURS) 21 mg/24hr patch Place 1 patch (21 mg total) onto the skin daily. 08/25/18   Roseanne Reno, MD  thiamine (B-1) 100 MG/ML injection Inject 1 mL (100 mg total) into the vein daily. 08/26/18    Roseanne Reno, MD    Social History   Socioeconomic History  . Marital status: Married    Spouse name: Not on file  . Number of children: Not on file  . Years of education: Not on file  . Highest education level: Not on file  Occupational History  . Not on file  Social Needs  . Financial resource strain: Not on file  . Food insecurity:    Worry: Not on file    Inability: Not on file  . Transportation needs:    Medical: Not on file    Non-medical: Not on file  Tobacco Use  . Smoking status: Current Every Day Smoker    Packs/day: 1.00  . Smokeless tobacco: Never Used  Substance and Sexual Activity  . Alcohol use: Yes    Alcohol/week: 18.0 standard drinks    Types: 18 Cans of beer per week  . Drug use: Yes    Types: Marijuana  . Sexual activity: Not on file  Lifestyle  . Physical activity:    Days per week: Not on file    Minutes per session: Not on file  . Stress: Not on file  Relationships  . Social connections:    Talks on phone: Not on file    Gets together: Not on file    Attends religious service: Not on file    Active member of club or organization: Not on file    Attends meetings of clubs or organizations: Not on file    Relationship status: Not on file  . Intimate partner violence:    Fear of current or ex partner: Not on file    Emotionally abused: Not on file    Physically abused: Not on file    Forced sexual activity: Not on file  Other Topics Concern  . Not on file  Social History Narrative  . Not on file     Famil Hx:  See HPI  ROS: [x]  Positive   [ ]  Negative   [ ]  All sytems reviewed and are negative  Cardiac: [x]  chest pain/pressure-had chest pain Tuesday but not today []  palpitations [x]  SOB []  DOE  Vascular: []  pain in legs while walking []  pain in legs at rest []  pain in legs at night []  non-healing ulcers []  hx of DVT []  swelling in legs  Pulmonary: []  productive cough []  asthma/wheezing []  home O2  Neurologic: []   weakness in []  arms []  legs []  numbness in []  arms []  legs []  hx of CVA []  mini stroke [] difficulty speaking or slurred speech []  temporary loss of vision in one eye []  dizziness  Hematologic: []  hx of cancer []  bleeding problems []  problems with blood clotting easily  Endocrine:   []  diabetes []  thyroid disease  GI []  vomiting blood []  blood in stool  GU: []  CKD/renal failure []  HD--[]  M/W/F or []  T/T/S []  burning with urination []   blood in urine  Psychiatric: []  anxiety []  depression  Musculoskeletal: []  arthritis []  joint pain  Integumentary: []  rashes []  ulcers  Constitutional: []  fever []  chills   Physical Examination  Vitals:   08/27/18 1550 08/27/18 1615  BP: (!) 176/132 (!) 192/143  Pulse: 92 98  Resp: 15 17  SpO2: 98% 99%   There is no height or weight on file to calculate BMI.  General:  WDWN in NAD Gait: Not observed HENT: WNL, normocephalic Pulmonary: normal non-labored breathing, without Rales, rhonchi,  wheezing Cardiac: regular, without  Murmurs, rubs or gallops; without carotid bruits Abdomen:  soft, NT/ND, no masses Skin: without rashes Vascular Exam/Pulses:  Right Left  Radial 2+ (normal) 2+ (normal)  Ulnar 2+ (normal) 2+ (normal)  DP 2+ (normal) 2+ (normal)  PT 2+ (normal) 2+ (normal)   Extremities: without ischemic changes, without Gangrene , without cellulitis; without open wounds;  Musculoskeletal: no muscle wasting or atrophy  Neurologic: A&O X 3;  No focal weakness or paresthesias are detected; speech is fluent/normal Psychiatric:  The pt has Normal affect.   CBC    Component Value Date/Time   WBC 6.9 08/25/2018 2014   RBC 3.68 (L) 08/25/2018 2014   HGB 13.9 08/27/2018 1555   HGB 13.8 03/05/2014 1136   HCT 41.0 08/27/2018 1555   HCT 41.9 03/05/2014 1136   PLT 125 (L) 08/25/2018 2014   PLT 74 (L) 03/05/2014 1136   MCV 97.0 08/25/2018 2014   MCV 100 03/05/2014 1136   MCH 32.3 08/25/2018 2014   MCHC 33.3  08/25/2018 2014   RDW 14.8 08/25/2018 2014   RDW 14.7 (H) 03/05/2014 1136   LYMPHSABS 0.4 (L) 12/20/2007 2010   MONOABS 0.7 12/20/2007 2010   EOSABS 0.0 12/20/2007 2010   BASOSABS 0.0 12/20/2007 2010    BMET    Component Value Date/Time   NA 131 (L) 08/27/2018 1555   NA 126 (L) 03/05/2014 1136   K 3.3 (L) 08/27/2018 1555   K 3.7 03/05/2014 1136   CL 96 (L) 08/27/2018 1555   CL 89 (L) 03/05/2014 1136   CO2 25 08/25/2018 2014   CO2 29 03/05/2014 1136   GLUCOSE 111 (H) 08/27/2018 1555   GLUCOSE 88 03/05/2014 1136   BUN 6 08/27/2018 1555   BUN 9 03/05/2014 1136   CREATININE 1.20 08/27/2018 1555   CREATININE 1.46 (H) 03/05/2014 1136   CALCIUM 8.9 08/25/2018 2014   CALCIUM 9.7 03/05/2014 1136   GFRNONAA 57 (L) 08/25/2018 2014   GFRNONAA 53 (L) 03/05/2014 1136   GFRAA >60 08/25/2018 2014   GFRAA >60 03/05/2014 1136    COAGS: Lab Results  Component Value Date   INR 0.95 08/27/2018   INR 1.06 08/25/2018   INR 1.02 08/25/2018   Non-Invasive Vascular Imaging:  CT chest 08/25/18: IMPRESSION: 1. Negative for acute pulmonary embolus. 2. Poorly opacified aorta however crescent-shaped high density with displacement of wall calcification at the distal arch and descending thoracic aorta is concerning for either acute intramural hematoma or dissection. Repeat CTA with dissection technique is advised.    Statin:  No. Beta Blocker:  No. Aspirin:  No. ACEI:  No. ARB:  No. CCB use:  No Other antiplatelets/anticoagulants:  No.    ASSESSMENT/PLAN: This is a 62 y.o. male with small penetrating descending thoracic aorta who left AMA Tuesday and presents back to ER today   -pt states he presents back because he knows he should not have left the hospital.  He has denied having  chest or back pain multiple times today when asked several times.  Given that at this point, he is asymptomatic, we would treat this medically.  Recommend admission to hospitalist service for blood pressure  management for goals of systolic < 140 and HR <80.  -if pt becomes symptomatic, he may need surgical repair.  If he remains asymptomatic and blood pressure is under control, we can follow him as outpatient with CT scans regularly.   Doreatha Massed, PA-C [facility] [phone]  I have seen and evaluated the patient. I agree with the PA note as documented above. Seen by Dr. Edilia Bo 2 days ago for small PAU with IMH of thoracic aorta and patient left AMA.  At this time denies any chest or back pain like he had the other day and states this has resolved.  BP 190-200 in ED.  Discussed needs aggressive BP control with Systolic <140 and HR <80.  He states not taking any medications at home.  If remains asymptomatic with no new chest or back pain, can continue surveillance for now.    Cephus Shelling, MD [facility] [place] Office: [phone] Pager: [phone]  Cephus Shelling

## 2018-08-27 NOTE — ED Triage Notes (Signed)
Pt here from home with c/o chest pain and htn , pt received 1 nitro and dropped his b/p 350 NS given and 1 324 mg asa 1 out of 10 on pain scale on arrival

## 2018-08-28 DIAGNOSIS — I160 Hypertensive urgency: Secondary | ICD-10-CM

## 2018-08-28 DIAGNOSIS — I712 Thoracic aortic aneurysm, without rupture: Secondary | ICD-10-CM

## 2018-08-28 DIAGNOSIS — I723 Aneurysm of iliac artery: Secondary | ICD-10-CM

## 2018-08-28 LAB — BASIC METABOLIC PANEL
Anion gap: 12 (ref 5–15)
BUN: 12 mg/dL (ref 6–20)
CHLORIDE: 97 mmol/L — AB (ref 98–111)
CO2: 22 mmol/L (ref 22–32)
Calcium: 9.3 mg/dL (ref 8.9–10.3)
Creatinine, Ser: 1.32 mg/dL — ABNORMAL HIGH (ref 0.61–1.24)
GFR calc Af Amer: >60 mL/min (ref >60)
GFR calc non Af Amer: 57 mL/min — ABNORMAL LOW (ref >60)
GLUCOSE: 97 mg/dL (ref 70–99)
POTASSIUM: 3.3 mmol/L — AB (ref 3.5–5.1)
Sodium: 131 mmol/L — ABNORMAL LOW (ref 135–145)

## 2018-08-28 LAB — CBC
HEMATOCRIT: 37.3 % — AB (ref 39.0–52.0)
HEMOGLOBIN: 12.6 g/dL — AB (ref 13.0–17.0)
MCH: 32.1 pg (ref 26.0–34.0)
MCHC: 33.8 g/dL (ref 30.0–36.0)
MCV: 94.9 fL (ref 80.0–100.0)
Platelets: 115 10*3/uL — ABNORMAL LOW (ref 150–400)
RBC: 3.93 MIL/uL — AB (ref 4.22–5.81)
RDW: 14.7 % (ref 11.5–15.5)
WBC: 6.9 10*3/uL (ref 4.0–10.5)
nRBC: 0 % (ref 0.0–0.2)

## 2018-08-28 LAB — HIV ANTIBODY (ROUTINE TESTING W REFLEX): HIV Screen 4th Generation wRfx: NONREACTIVE

## 2018-08-28 MED ORDER — POTASSIUM CHLORIDE CRYS ER 20 MEQ PO TBCR
20.0000 meq | EXTENDED_RELEASE_TABLET | ORAL | Status: AC
  Administered 2018-08-28 (×2): 20 meq via ORAL
  Filled 2018-08-28 (×2): qty 1

## 2018-08-28 MED ORDER — LABETALOL HCL 5 MG/ML IV SOLN
10.0000 mg | INTRAVENOUS | Status: DC | PRN
  Administered 2018-08-28 (×2): 10 mg via INTRAVENOUS
  Filled 2018-08-28: qty 4

## 2018-08-28 MED ORDER — HALOPERIDOL LACTATE 5 MG/ML IJ SOLN: 5.0000 mg | Freq: Four times a day (QID) | INTRAMUSCULAR | Status: DC | PRN

## 2018-08-28 MED ORDER — DIPHENHYDRAMINE HCL 25 MG PO CAPS
25.0000 mg | ORAL_CAPSULE | Freq: Once | ORAL | Status: AC
  Administered 2018-08-28: 25 mg via ORAL
  Filled 2018-08-28: qty 1

## 2018-08-28 MED ORDER — NICOTINE 7 MG/24HR TD PT24
7.0000 mg | MEDICATED_PATCH | Freq: Every day | TRANSDERMAL | Status: DC
  Administered 2018-08-28 – 2018-08-30 (×3): 7 mg via TRANSDERMAL
  Filled 2018-08-28 (×3): qty 1

## 2018-08-28 MED ORDER — INFLUENZA VAC SPLIT QUAD 0.5 ML IM SUSY: 0.5000 mL | PREFILLED_SYRINGE | INTRAMUSCULAR | Status: DC | PRN

## 2018-08-28 NOTE — Progress Notes (Signed)
CRITICAL VALUE ALERT  Critical Value:  Potassium 3.3  Date & Time Notied:  08/28/18 5:35am  Provider Notified: Ermelinda Das, RN  Orders Received/Actions taken: Elink to order PO potassium replacement.

## 2018-08-28 NOTE — Progress Notes (Signed)
[facility] [place]  Subjective  - Admitted to ICU overnight for BP control.  Left AMA earlier this week after small PAU and IMH noted on CT.  On evaluation last night and again this morning no CP, back pain etc.  Symptoms from earlier this week resolved.   Objective 126/85 75 97.6 F (36.4 C) (Oral) 18 94%  Intake/Output Summary (Last 24 hours) at 08/28/2018 1119 Last data filed at 08/28/2018 0657 Gross per 24 hour  Intake 308.61 ml  Output 325 ml  Net -16.39 ml    General: NAD, resting Palpable pedal pulses  Laboratory Lab Results: Recent Labs    08/27/18 1543 08/27/18 1555 08/28/18 0351  WBC 7.4  --  6.9  HGB 13.4 13.9 12.6*  HCT 39.3 41.0 37.3*  PLT 119*  --  115*   BMET Recent Labs    08/27/18 2057 08/28/18 0351  NA 133* 131*  K 3.4* 3.3*  CL 95* 97*  CO2 24 22  GLUCOSE 115* 97  BUN 7 12  CREATININE 1.23 1.32*  CALCIUM 9.5 9.3    COAG Lab Results  Component Value Date   INR 0.95 08/27/2018   INR 1.06 08/25/2018   INR 1.02 08/25/2018   No results found for: PTT  Assessment/Planning: Discussed with patient no role for surgical intervention for small PAU/IMH at this time given symptom resolution.  Needs aggressive BP management.  Will arrange one month follow-up with CTA in vascular surgery clinic.     Cephus Shelling 08/28/2018 11:19 AM -- Cephus Shelling

## 2018-08-28 NOTE — Care Management Note (Addendum)
Case Management Note  Patient Details  Name: [name] MRN: [id number] Date of Birth: 1956-05-31  Subjective/Objective:   62yo male presented with hypertensive urgency.              Action/Plan: CM consult acknowledged. CM met with patient to discuss transitional needs. Patient lives at home with spouse, independent with ADLs PTA. Patient verified no local PCP, nor health insurance coverage, but agreeable to CM assisting with a hospital f/u appointment and medication [facility] f/u appointment arranged at: [facility] on 09/02/18 @ 1030; patient can f/u at [facility] for Rxs; AVS updated. Patient informed CM he's unable to afford his Rxs at discharge; will need to utilize [facility], so please ensure patient is on the lowest cost Rxs and reconsult CM for [facility] letter once discharge medications are determined. Patient indicated his spouse or friend will provide transportation home. CM will continue to follow.   Expected Discharge Date:                  Expected Discharge Plan:  Home/Self Care  In-House Referral:  NA  Discharge planning Services  CM Consult, [facility], Medication Assistance, [facility] Program  Post Acute Care Choice:  NA Choice offered to:  NA  DME Arranged:  N/A DME Agency:  NA  HH Arranged:  NA HH Agency:  NA  Status of Service:  In process, will continue to follow  If discussed at Long Length of Stay Meetings, dates discussed:    Additional Comments:  Midge Minium RN, BSN, NCM-BC, ACM-RN [phone] 08/28/2018, 11:23 AM

## 2018-08-28 NOTE — Progress Notes (Signed)
[facility] ADULT ICU REPLACEMENT PROTOCOL FOR AM LAB REPLACEMENT ONLY  The patient does apply for the [facility] Adult ICU Electrolyte Replacment Protocol based on the criteria listed below:   1. Is GFR >/= 40 ml/min? Yes.    Patient's GFR today is >60 2. Is urine output >/= 0.5 ml/kg/hr for the last 6 hours? Yes.   Patient's UOP is 0.8 ml/kg/hr 3. Is BUN < 60 mg/dL? Yes.    Patient's BUN today is 12 4. Abnormal electrolyte(s): k 3.3 5. Ordered repletion with: protocol 6. If a panic level lab has been reported, has the CCM MD in charge been notified? No..   Physician:    Markus Daft A 08/28/2018 5:37 AM

## 2018-08-28 NOTE — Progress Notes (Signed)
Attempted to call report to 5M.  

## 2018-08-28 NOTE — Progress Notes (Signed)
NAME:  ARON INGE, MRN:  [id number], DOB:  14-Apr-1956, LOS: 1 ADMISSION DATE:  08/27/2018, CONSULTATION DATE:  10/17 REFERRING MD:  Dr. 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   Past Medical History  Alcohol and tobacco abuse, hypertension, arthritis  Significant Hospital Events   10/15 admit for penetrating ulce of descending aorta, left AMA 10/17 came back for treatment. Started on labetalol infusion 10/18 on orals, off drip. Out to tele  Consults: date of consult/date signed off & final recs:  Vascular surgery 10/17 >  Procedures (surgical and bedside):    Significant Diagnostic Tests:  CT angiogram chest 10/15 > Intramural hematoma within the distal aortic arch extending from downstream to the left subclavian artery to the midthoracic level. 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 No evidence of dissection flap, aortic aneurysm, or additional acute vascular abnormality. Widely patent great vessel origins. 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   Micro Data:    Antimicrobials:    Subjective:  Feels about the same today. Had once brief episode of chest pain that occurred when coughing, but resolved quickly.   Objective   Blood pressure (!) 136/92, pulse 63, temperature 97.9 F (36.6 C), temperature source Oral, resp. rate 15, height 5\' 11"  (1.803 m), weight 66.5 kg, SpO2 97 %.        Intake/Output Summary (Last 24 hours) at 08/28/2018 0842 Last data filed at 08/28/2018 1914 Gross per 24 hour  Intake 308.61 ml  Output 325 ml  Net -16.39 ml   Filed Weights   08/27/18 2000 08/28/18 0500  Weight: 64.9 kg 66.5 kg    Examination: General: Disheveled middle aged male in NAD HENT: [place]/AT, PERRL, no JVD Lungs: Clear Cardiovascular: RRR, no MRG Abdomen:Soft, NT,  ND Extremities: No acute deformity, ROM limitation, or edema Neuro: Alert, oriented, non-focal  Resolved Hospital Problem list     Assessment & Plan:   Penetrating aortic ulcer  in the setting of hypertensive crisis. Asymptomatic on presentation. Started on oral agents and labetalol infusion. Off infusion by 10/18. - Vascular surgery following, and recommending BP control with SBP goal < 140 and HR goal < 80.  - DC labetalol infusion, add PRN IV push - Continue coreg and amlodipine - Transfer to telemetry, TRH  Alcohol abuse 12 beers per day. At risk for withdrawal - CIWA protocol with Ativan and PRN haldol.  - Thiamine, folate  Hypokalemia -replete K (done by [facility] MD)  Tobacco abuse - Now says he is ready to quit when he gets home.  - Declines nicotine patch.  AKI -follow BMP  Disposition / Summary of Today's Plan 08/28/18   Off labetalol infusion Continue oral agents Transfer to Tele/TRH    Diet: heart healthy Pain/Anxiety/Delirium protocol (if indicated): CIWA VAP protocol (if indicated): n/a DVT prophylaxis: SCDs GI prophylaxis: N/a Hyperglycemia protocol: n/a Mobility: bedrest for tonight Code Status: Full Family Communication: Patient updated   Labs   CBC: Recent Labs  Lab 08/24/18 2208 08/25/18 2014 08/27/18 1543 08/27/18 1555 08/28/18 0351  WBC 10.9* 6.9 7.4  --  6.9  NEUTROABS  --   --  5.1  --   --   HGB 12.6* 11.9* 13.4 13.9 12.6*  HCT 36.8* 35.7* 39.3 41.0 37.3*  MCV 96.1 97.0 95.2  --  94.9  PLT 130*  125* 119*  --  115*    Basic Metabolic Panel: Recent Labs  Lab 08/24/18 2208 08/25/18 0716 08/25/18 2014 08/27/18 1555 08/27/18 2057 08/28/18 0351  NA 132* 131* 133* 131* 133* 131*  K 3.9 4.3 4.0 3.3* 3.4* 3.3*  CL 95* 98 101 96* 95* 97*  CO2 26 22 25   --  24 22  GLUCOSE 133* 124* 122* 111* 115* 97  BUN 13 14 11 6 7 12   CREATININE 1.17 1.31* 1.32* 1.20 1.23 1.32*  CALCIUM 9.3 8.5* 8.9  --  9.5 9.3  MG  --  1.5*  --   --   --    --   PHOS  --  3.4  --   --   --   --    GFR: Estimated Creatinine Clearance: 56 mL/min (A) (by C-G formula based on SCr of 1.32 mg/dL (H)). Recent Labs  Lab 08/24/18 2208 08/25/18 2014 08/27/18 1543 08/27/18 1555 08/28/18 0351  WBC 10.9* 6.9 7.4  --  6.9  LATICACIDVEN  --   --   --  1.42  --     Liver Function Tests: Recent Labs  Lab 08/24/18 2208 08/25/18 2014  AST 23 21  ALT 14 14  ALKPHOS 78 75  BILITOT 0.6 0.9  PROT 7.2 6.6  ALBUMIN 4.2 3.5   Recent Labs  Lab 08/24/18 2208  LIPASE 16   No results for input(s): AMMONIA in the last 168 hours.  ABG    Component Value Date/Time   HCO3 23.5 07/19/2007 0359   TCO2 23 08/27/2018 1555   ACIDBASEDEF 1.0 07/18/2007 1853     Coagulation Profile: Recent Labs  Lab 08/25/18 0252 08/25/18 2014 08/27/18 1543  INR 1.02 1.06 0.95    Cardiac Enzymes: Recent Labs  Lab 08/24/18 2208 08/25/18 0404 08/25/18 0716  TROPONINI 0.04* 0.03* 0.04*    HbA1C: Hgb A1c MFr Bld  Date/Time Value Ref Range Status  08/25/2018 07:16 AM 5.5 4.8 - 5.6 % Final    Comment:    (NOTE) Pre diabetes:          5.7%-6.4% Diabetes:              >6.4% Glycemic control for   <7.0% adults with diabetes     CBG: Recent Labs  Lab 08/25/18 0637  GLUCAP 142*    Admitting History of Present Illness.   62 year old male with PMH significant for HTN, ETOH abuse, and tobacco abuse. He was on clonidine for several years, but stopped taking when he ran out of scripts and money. He drinks about 12 beers per day. Presented to [facility] ED 10/15 with complaints of excruciating chest pain with exertion. CT angio of the cehst demonstrated a penetrating ulcer in the distal aortic arch. He was profoundly hypertensive and was transferred to [facility] for BP control. His mental status became altered and he left AMA. He does not remember doing this. Once at home, he realized he should have stayed in the hospital for treatment, and has returned for admission  10/17.   Upon arrival to the ED he had no complaints, but remained profoundly hypertensive with SBP 230s. He was given 5mg  of labetalol in ED with minimal response. Vascular surgery was consulted and recommended medical management targeted at BP control. PCCM called for admission.   Review of Systems:   Bolds are positive  Constitutional: weight loss, gain, night sweats, Fevers, chills, fatigue .  HEENT: headaches, Sore throat, sneezing, nasal congestion, post  nasal drip, Difficulty swallowing, Tooth/dental problems, visual complaints visual changes, ear ache CV:  chest pain, radiates:,Orthopnea, PND, swelling in lower extremities, dizziness, palpitations, syncope.  GI  heartburn, indigestion, abdominal pain, nausea, vomiting, diarrhea, change in bowel habits, loss of appetite, bloody stools.  Resp: cough, productive:, hemoptysis, dyspnea, chest pain, pleuritic.  Skin: rash or itching or icterus GU: dysuria, change in color of urine, urgency or frequency. flank pain, hematuria  MS: joint pain or swelling. decreased range of motion  Psych: change in mood or affect. depression or anxiety.  Neuro: difficulty with speech, weakness, numbness, ataxia    Past Medical History  He,  has a past medical history of Hypertension.   Surgical History   History reviewed. No pertinent surgical history.   Social History   Social History   Socioeconomic History  . Marital status: Married    Spouse name: Not on file  . Number of children: Not on file  . Years of education: Not on file  . Highest education level: Not on file  Occupational History  . Not on file  Social Needs  . Financial resource strain: Not on file  . Food insecurity:    Worry: Not on file    Inability: Not on file  . Transportation needs:    Medical: Not on file    Non-medical: Not on file  Tobacco Use  . Smoking status: Current Every Day Smoker    Packs/day: 1.00  . Smokeless tobacco: Never Used  Substance and Sexual  Activity  . Alcohol use: Yes    Alcohol/week: 18.0 standard drinks    Types: 18 Cans of beer per week  . Drug use: Yes    Types: Marijuana  . Sexual activity: Not on file  Lifestyle  . Physical activity:    Days per week: Not on file    Minutes per session: Not on file  . Stress: Not on file  Relationships  . Social connections:    Talks on phone: Not on file    Gets together: Not on file    Attends religious service: Not on file    Active member of club or organization: Not on file    Attends meetings of clubs or organizations: Not on file    Relationship status: Not on file  . Intimate partner violence:    Fear of current or ex partner: Not on file    Emotionally abused: Not on file    Physically abused: Not on file    Forced sexual activity: Not on file  Other Topics Concern  . Not on file  Social History Narrative  . Not on file  ,  reports that he has been smoking. He has been smoking about 1.00 pack per day. He has never used smokeless tobacco. He reports that he drinks about 18.0 standard drinks of alcohol per week. He reports that he has current or past drug history. Drug: Marijuana.   Family History   His family history is not on file.   Allergies Allergies  Allergen Reactions  . Codeine Phosphate     REACTION: unspecified     Home Medications  Prior to Admission medications   Medication Sig Start Date End Date Taking? Authorizing Provider  aspirin 81 MG chewable tablet Chew 1 tablet (81 mg total) by mouth daily. 08/26/18   Roseanne Reno, MD  Dextrose-Sodium Chloride (DEXTROSE 5 % AND 0.45% NACL) infusion Inject 50 mL/hr into the vein continuous. 08/25/18   Roseanne Reno, MD  esmolol (BREVIBLOC) 2000 mg / 100 mL infusion Inject 1.5875-19.05 mg/min into the vein continuous. 08/25/18   Roseanne Reno, MD  famotidine (PEPCID) 20-0.9 MG/50ML-% Inject 50 mLs (20 mg total) into the vein every 12 (twelve) hours. 08/25/18   Roseanne Reno, MD  LORazepam (ATIVAN) 1 MG tablet  Take 1 tablet (1 mg total) by mouth every 6 (six) hours as needed (CIWA-AR > 8  -OR-  withdrawal symptoms:  anxiety, agitation, insomnia, diaphoresis, nausea, vomiting, tremors, tachycardia, or hypertension.). 08/25/18   Roseanne Reno, MD  Multiple Vitamin (MULTIVITAMIN WITH MINERALS) TABS tablet Take 1 tablet by mouth daily. 08/26/18   Roseanne Reno, MD  niCARdipine in saline (CARDENE-IV) 20-0.86 MG/200ML-% SOLN Inject 3-15 mg/hr into the vein continuous. 08/25/18   Roseanne Reno, MD  nicotine (NICODERM CQ - DOSED IN MG/24 HOURS) 21 mg/24hr patch Place 1 patch (21 mg total) onto the skin daily. 08/25/18   Roseanne Reno, MD  thiamine (B-1) 100 MG/ML injection Inject 1 mL (100 mg total) into the vein daily. 08/26/18   Roseanne Reno, MD     Joneen Roach, AGACNP-BC [facility] Pager [phone] or [phone]  08/28/2018 8:42 AM

## 2018-08-29 DIAGNOSIS — I719 Aortic aneurysm of unspecified site, without rupture: Secondary | ICD-10-CM

## 2018-08-29 LAB — BASIC METABOLIC PANEL
Anion gap: 8 (ref 5–15)
BUN: 18 mg/dL (ref 6–20)
CALCIUM: 9.3 mg/dL (ref 8.9–10.3)
CO2: 25 mmol/L (ref 22–32)
CREATININE: 1.32 mg/dL — AB (ref 0.61–1.24)
Chloride: 99 mmol/L (ref 98–111)
GFR calc Af Amer: >60 mL/min (ref >60)
GFR, EST NON AFRICAN AMERICAN: 57 mL/min — AB (ref >60)
GLUCOSE: 104 mg/dL — AB (ref 70–99)
POTASSIUM: 3.6 mmol/L (ref 3.5–5.1)
SODIUM: 132 mmol/L — AB (ref 135–145)

## 2018-08-29 MED ORDER — DIPHENHYDRAMINE HCL 25 MG PO CAPS
25.0000 mg | ORAL_CAPSULE | Freq: Four times a day (QID) | ORAL | Status: AC | PRN
  Administered 2018-08-30: 25 mg via ORAL
  Filled 2018-08-29: qty 1

## 2018-08-29 MED ORDER — AMLODIPINE BESYLATE 10 MG PO TABS
10.0000 mg | ORAL_TABLET | Freq: Every day | ORAL | Status: DC
  Administered 2018-08-29 – 2018-08-30 (×2): 10 mg via ORAL
  Filled 2018-08-29 (×2): qty 1

## 2018-08-29 MED ORDER — HYDRALAZINE HCL 20 MG/ML IJ SOLN
10.0000 mg | Freq: Four times a day (QID) | INTRAMUSCULAR | Status: DC | PRN
  Administered 2018-08-29 – 2018-08-30 (×2): 10 mg via INTRAVENOUS
  Filled 2018-08-29 (×2): qty 1

## 2018-08-29 NOTE — Progress Notes (Signed)
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  Patient was initially admitted to the intensive care unit.  Stabilized and then transferred to the floor.  Reason for Visit: Hypertensive emergency  Consultants: Vascular surgery  Procedures:   Transthoracic echocardiogram Study Conclusions  - Left ventricle: The cavity size was normal. Wall thickness was   normal. Systolic function was normal. Wall motion was normal;   there were no regional wall motion abnormalities.  Impressions: - Normal LVF   Normal Wall Motion   EF=55%   Normal Right side. Normal study. No cardiac source of emboli was   indentified.  Antibiotics: None  Subjective/Interval History: Patient states that he feels well.  Denies any headaches.  No chest pain or shortness of breath.  ROS: No dizziness or lightheadedness.  Objective:  Vital Signs  Vitals:   08/29/18 0156 08/29/18 0200 08/29/18 0433 08/29/18 0816  BP: (!) 155/99 126/72 (!) 137/98 (!) 146/99  Pulse: 85 82 78 76  Resp:   18 18  Temp:   98 F (36.7 C) 98.3 F (36.8 C)  TempSrc:   Oral Oral  SpO2: 97% 100% 96% 98%  Weight:      Height:        Intake/Output Summary (Last 24 hours) at 08/29/2018 1134 Last data filed at 08/29/2018 0951 Gross per 24 hour  Intake 950 ml  Output 1100 ml  Net -150 ml   Filed Weights   08/27/18 2000 08/28/18 0500 08/28/18 2129  Weight: 64.9 kg 66.5 kg 66.5 kg    General appearance: alert, cooperative, appears stated age and no distress Resp: clear to auscultation bilaterally Cardio: regular rate and rhythm, S1, S2 normal, no murmur, click, rub or gallop GI: soft, non-tender; bowel sounds normal; no masses,  no organomegaly Extremities: extremities normal,  atraumatic, no cyanosis or edema Pulses: 2+ and symmetric Neurologic: Alert and oriented x3.  No focal neurological deficits.  Lab Results:  Data Reviewed: I have personally reviewed following labs and imaging studies  CBC: Recent Labs  Lab 08/24/18 2208 08/25/18 2014 08/27/18 1543 08/27/18 1555 08/28/18 0351  WBC 10.9* 6.9 7.4  --  6.9  NEUTROABS  --   --  5.1  --   --   HGB 12.6* 11.9* 13.4 13.9 12.6*  HCT 36.8* 35.7* 39.3 41.0 37.3*  MCV 96.1 97.0 95.2  --  94.9  PLT 130* 125* 119*  --  115*    Basic Metabolic Panel: Recent Labs  Lab 08/25/18 0716 08/25/18 2014 08/27/18 1555 08/27/18 2057 08/28/18 0351 08/29/18 0429  NA 131* 133* 131* 133* 131* 132*  K 4.3 4.0 3.3* 3.4* 3.3* 3.6  CL 98 101 96* 95* 97* 99  CO2 22 25  --  24 22 25   GLUCOSE 124* 122* 111* 115* 97 104*  BUN 14 11 6 7 12 18   CREATININE 1.31* 1.32* 1.20 1.23 1.32* 1.32*  CALCIUM 8.5* 8.9  --  9.5 9.3 9.3  MG 1.5*  --   --   --   --   --   PHOS 3.4  --   --   --   --   --     GFR: Estimated Creatinine Clearance: 56 mL/min (A) (by C-G formula based on SCr of 1.32 mg/dL (H)).  Liver Function Tests:  Recent Labs  Lab 08/24/18 2208 08/25/18 2014  AST 23 21  ALT 14 14  ALKPHOS 78 75  BILITOT 0.6 0.9  PROT 7.2 6.6  ALBUMIN 4.2 3.5    Recent Labs  Lab 08/24/18 2208  LIPASE 16   Coagulation Profile: Recent Labs  Lab 08/25/18 0252 08/25/18 2014 08/27/18 1543  INR 1.02 1.06 0.95    Cardiac Enzymes: Recent Labs  Lab 08/24/18 2208 08/25/18 0404 08/25/18 0716  TROPONINI 0.04* 0.03* 0.04*   CBG: Recent Labs  Lab 08/25/18 0637  GLUCAP 142*     Recent Results (from the past 240 hour(s))  MRSA PCR Screening     Status: None   Collection Time: 08/25/18  6:37 AM  Result Value Ref Range Status   MRSA by PCR NEGATIVE NEGATIVE Final    Comment:        The GeneXpert MRSA Assay (FDA approved for NASAL specimens only), is one component of a comprehensive MRSA  colonization surveillance program. It is not intended to diagnose MRSA infection nor to guide or monitor treatment for MRSA infections. Performed at [facility], [address] [place], [place] [address]   Surgical pcr screen     Status: None   Collection Time: 08/25/18  6:50 PM  Result Value Ref Range Status   MRSA, PCR NEGATIVE NEGATIVE Final   Staphylococcus aureus NEGATIVE NEGATIVE Final    Comment: (NOTE) The Xpert SA Assay (FDA approved for NASAL specimens in patients 67 years of age and older), is one component of a comprehensive surveillance program. It is not intended to diagnose infection nor to guide or monitor treatment. Performed at [facility], [address] [place], [place] [address]       Radiology Studies: Dg Chest Portable 1 View  Result Date: 08/27/2018 CLINICAL DATA:  Chest pain. EXAM: PORTABLE CHEST 1 VIEW COMPARISON:  Radiographs of August 24, 2018. CT scan of August 25, 2018. FINDINGS: Cardiac size is within normal limits. Stable enlargement of aortic arch and proximal descending thoracic aorta is noted consistent with intramural hematoma as described on prior CT scan. Both lungs are clear. No pneumothorax or pleural effusion is noted. The visualized skeletal structures are unremarkable. IMPRESSION: Stable enlargement of aortic arch and proximal descending thoracic aorta is noted consistent with history of intramural hematoma. No other abnormality is noted. Electronically Signed   By: Lupita Raider, M.D.   On: 08/27/2018 15:42     Medications:  Scheduled: . amLODipine  10 mg Oral Daily  . carvedilol  6.25 mg Oral BID WC  . enoxaparin (LOVENOX) injection  40 mg Subcutaneous Q24H  . folic acid  1 mg Oral Daily  . multivitamin with minerals  1 tablet Oral Daily  . nicotine  7 mg Transdermal Daily  . pneumococcal 23 valent vaccine  0.5 mL Intramuscular Tomorrow-1000  . thiamine  100 mg Oral Daily    Continuous:  UJW:[id number], bisacodyl, haloperidol lactate, hydrALAZINE, Influenza vac split quadrivalent PF, labetalol, LORazepam, ondansetron (ZOFRAN) IV, senna-docusate  Assessment/Plan:    Penetrating aortic ulcer with intramural Hematoma This was in the setting of hypertensive crisis.  Patient seen by vascular surgery.  They recommended aggressive blood pressure control.  Patient was placed on labetalol infusion.  No role for surgery at this time.  They will follow the patient in their office in 1 month.  Patient is asymptomatic currently  Hypertensive crisis Initially was on labetalol infusion.  Then started on carvedilol and amlodipine.  Blood  pressure improved but not at goal quite yet.  We will increase the dose of amlodipine today.  Labetalol infusion was discontinued on 10/18.  History of alcohol abuse CIWA protocol was initiated.  No evidence for alcohol withdrawal currently.  Thiamine folate.  Hypokalemia This has been repleted.  Tobacco abuse Counseled  Acute kidney injury  Creatinine is stable.  Patient has been making urine.  Outpatient monitoring.  DVT Prophylaxis: Lovenox    Code Status: Full code Family Communication: Discussed with the patient Disposition Plan: Management as outlined above.  Dose of amlodipine increased today. If blood pressure is better controlled and he could be discharged tomorrow.    LOS: 2 days   Osvaldo Shipper  [facility] Pager [phone] 08/29/2018, 11:34 AM  If 7PM-7AM, please contact night-coverage at [email or web address], password [facility]

## 2018-08-30 LAB — TROPONIN I
Troponin I: 0.03 ng/mL (ref <0.03)
Troponin I: 0.04 ng/mL (ref <0.03)

## 2018-08-30 MED ORDER — ADULT MULTIVITAMIN W/MINERALS CH: 1.0000 | ORAL_TABLET | Freq: Every day | ORAL | 0 refills | Status: DC

## 2018-08-30 MED ORDER — CARVEDILOL 6.25 MG PO TABS: 6.2500 mg | ORAL_TABLET | Freq: Two times a day (BID) | ORAL | 0 refills | Status: DC

## 2018-08-30 MED ORDER — NICOTINE 14 MG/24HR TD PT24: 14.0000 mg | MEDICATED_PATCH | Freq: Every day | TRANSDERMAL | 0 refills | Status: DC

## 2018-08-30 MED ORDER — AMLODIPINE BESYLATE 10 MG PO TABS: 10.0000 mg | ORAL_TABLET | Freq: Every day | ORAL | 0 refills | Status: DC

## 2018-08-30 MED ORDER — THIAMINE HCL 100 MG PO TABS: 100.0000 mg | ORAL_TABLET | Freq: Every day | ORAL | 0 refills | Status: DC

## 2018-08-30 MED ORDER — CALCIUM CARBONATE ANTACID 500 MG PO CHEW
1.0000 | CHEWABLE_TABLET | Freq: Once | ORAL | Status: AC
  Administered 2018-08-30: 200 mg via ORAL
  Filled 2018-08-30: qty 1

## 2018-08-30 MED ORDER — ACETAMINOPHEN 325 MG PO TABS
650.0000 mg | ORAL_TABLET | Freq: Once | ORAL | Status: AC
  Administered 2018-08-30: 650 mg via ORAL
  Filled 2018-08-30: qty 2

## 2018-08-30 NOTE — Significant Event (Signed)
Rapid Response Event Note  Overview: Chest Pain  Initial Focused Assessment: Informed by RN that patient is having pain, midsternal - left anterior chest, left lateral chest, and pain into the left shoulder blade. + Indigestion per RN. SBP > 170 as well. RN page provider on call, orders received for EKG, TUMS, treat BP with order medications. Patient was alert and oriented, not in acute distress, no SOB/WOB, +2 pulses in all extremities, skin warm and dry. Per patient this was acute.   Interventions: -- EKG - no acute changes -- HTN - improved after Hydralazine 10mg  IV -- Pain present 4-5/10, provider on call paged by RR RN at 0150 -- Orders for Troponin x 1 and APAP 650 mg POx 1 -- Heat packs applied for comfort  Plan of Care: -- At 210, patient felt better, pain has improved.     at    Event Summary:   Start Time 0100 End Time 0215  [name] R

## 2018-08-30 NOTE — Progress Notes (Signed)
New orders completed. BP 158/100. Patient states, "I'm feeling a whole lot better." Will continue to monitor.

## 2018-08-30 NOTE — Discharge Instructions (Signed)

## 2018-08-30 NOTE — Progress Notes (Signed)
Patient discharged to home. Patient AVS reviewed and signed. Patient capable re-verbalizing medications and follow-up appointments. IV removed. Patient belongings sent with patient. Patient educated to return to the ED in the event of SOB, chest pain or dizziness.   Ramello Cordial B. RN 

## 2018-08-30 NOTE — Discharge Summary (Signed)
[facility]  Physician Discharge Summary   Patient ID: [name] MRN: [id number] DOB/AGE: 1955-12-27 62 y.o.  Admit date: 08/27/2018 Discharge date: 08/30/2018  PCP: Patient, No Pcp Per  DISCHARGE DIAGNOSES:  Hypertensive crisis, improved Essential hypertension Penetrating aortic ulcer with intramural hematoma, stable History of alcohol abuse  RECOMMENDATIONS FOR OUTPATIENT FOLLOW UP: 1. Close outpatient follow-up for blood pressure management 2. Vascular surgery to see the patient in their office in 1 month   DISCHARGE CONDITION: fair  Diet recommendation: Heart healthy  Filed Weights   08/28/18 0500 08/28/18 2129 08/29/18 2118  Weight: 66.5 kg 66.5 kg 66.7 kg    INITIAL HISTORY: 62 year old male with recently diagnosed penetrating ulce of descending aorta admitted with hypertensive crisis on labetalol infusion.  Patient was initially admitted to the intensive care unit.  Stabilized and then transferred to the floor.  Consultants: Vascular surgery  Procedures:   Transthoracic echocardiogram Study Conclusions  - Left ventricle: The cavity size was normal. Wall thickness was normal. Systolic function was normal. Wall motion was normal; there were no regional wall motion abnormalities.  Impressions: - Normal LVF Normal Wall Motion EF=55% Normal Right side. Normal study. No cardiac source of emboli was indentified.   HOSPITAL COURSE:   Penetrating aortic ulcer with intramural Hematoma This was in the setting of hypertensive crisis.  Patient seen by vascular surgery.  They recommended aggressive blood pressure control. No role for surgery at this time. Patient was placed on labetalol infusion.    Blood pressure improved.  Patient was transitioned to oral antihypertensives.  Vascular surgery will follow the patient in their office in 1 month.    Patient did have an episode of chest pain overnight which resolved after he burped and was  given Tylenol.  EKG did not show any new changes compared to previous EKG.  Troponin flat as before.  Patient feels well this morning.  Has been ambulating in the hallway without any difficulties.  Wants to go home.  Echocardiogram did not show any wall motion abnormalities.    Hypertensive crisis Initially was on labetalol infusion.  Then started on carvedilol and amlodipine.    Dose of amlodipine was increased.  Blood pressure is much better controlled now.  Will be discharged on amlodipine and carvedilol.  EKG and echocardiogram does suggest LVH.  He may benefit from ACE inhibitor but this will need to be addressed in the outpatient setting.    History of alcohol abuse CIWA protocol was initiated.  No evidence for alcohol withdrawal during this hospital stay.  Hypokalemia This has been repleted.  Tobacco abuse Counseled nicotine patch  Acute kidney injury  Creatinine is stable.  Patient has been making urine.  Outpatient monitoring.  Overall stable.  Okay for discharge home today.   PERTINENT LABS:  The results of significant diagnostics from this hospitalization (including imaging, microbiology, ancillary and laboratory) are listed below for reference.    Microbiology: Recent Results (from the past 240 hour(s))  MRSA PCR Screening     Status: None   Collection Time: 08/25/18  6:37 AM  Result Value Ref Range Status   MRSA by PCR NEGATIVE NEGATIVE Final    Comment:        The GeneXpert MRSA Assay (FDA approved for NASAL specimens only), is one component of a comprehensive MRSA colonization surveillance program. It is not intended to diagnose MRSA infection nor to guide or monitor treatment for MRSA infections. Performed at [facility], [address] [place], [place] [address]   Surgical pcr screen     Status: None   Collection Time: 08/25/18  6:50 PM  Result Value Ref Range Status   MRSA, PCR NEGATIVE NEGATIVE Final   Staphylococcus aureus NEGATIVE  NEGATIVE Final    Comment: (NOTE) The Xpert SA Assay (FDA approved for NASAL specimens in patients 69 years of age and older), is one component of a comprehensive surveillance program. It is not intended to diagnose infection nor to guide or monitor treatment. Performed at [facility], [address] [place], [place] [address]      Labs: Basic Metabolic Panel: Recent Labs  Lab 08/25/18 0716 08/25/18 2014 08/27/18 1555 08/27/18 2057 08/28/18 0351 08/29/18 0429  NA 131* 133* 131* 133* 131* 132*  K 4.3 4.0 3.3* 3.4* 3.3* 3.6  CL 98 101 96* 95* 97* 99  CO2 22 25  --  24 22 25   GLUCOSE 124* 122* 111* 115* 97 104*  BUN 14 11 6 7 12 18   CREATININE 1.31* 1.32* 1.20 1.23 1.32* 1.32*  CALCIUM 8.5* 8.9  --  9.5 9.3 9.3  MG 1.5*  --   --   --   --   --   PHOS 3.4  --   --   --   --   --    Liver Function Tests: Recent Labs  Lab 08/24/18 2208 08/25/18 2014  AST 23 21  ALT 14 14  ALKPHOS 78 75  BILITOT 0.6 0.9  PROT 7.2 6.6  ALBUMIN 4.2 3.5   Recent Labs  Lab 08/24/18 2208  LIPASE 16   CBC: Recent Labs  Lab 08/24/18 2208 08/25/18 2014 08/27/18 1543 08/27/18 1555 08/28/18 0351  WBC 10.9* 6.9 7.4  --  6.9  NEUTROABS  --   --  5.1  --   --   HGB 12.6* 11.9* 13.4 13.9 12.6*  HCT 36.8* 35.7* 39.3 41.0 37.3*  MCV 96.1 97.0 95.2  --  94.9  PLT 130* 125* 119*  --  115*   Cardiac Enzymes: Recent Labs  Lab 08/24/18 2208 08/25/18 0404 08/25/18 0716 08/30/18 0426 08/30/18 0820  TROPONINI 0.04* 0.03* 0.04* 0.03* 0.04*    CBG: Recent Labs  Lab 08/25/18 0637  GLUCAP 142*     IMAGING STUDIES Dg Chest 2 View  Result Date: 08/24/2018 CLINICAL DATA:  Chest pain EXAM: CHEST - 2 VIEW COMPARISON:  11/16/2007 FINDINGS: No focal opacity or pleural effusion. Mild bronchitic changes. Cardiomediastinal silhouette within normal limits. No pneumothorax. IMPRESSION: No active cardiopulmonary disease. Electronically Signed   By: Jasmine Pang M.D.   On: 08/24/2018  22:31   Ct Angio Chest Pe W And/or Wo Contrast  Result Date: 08/25/2018 CLINICAL DATA:  Chest pain short of breath EXAM: CT ANGIOGRAPHY CHEST WITH CONTRAST TECHNIQUE: Multidetector CT imaging of the chest was performed using the standard protocol during bolus administration of intravenous contrast. Multiplanar CT image reconstructions and MIPs were obtained to evaluate the vascular anatomy. CONTRAST:  75mL OMNIPAQUE IOHEXOL 350 MG/ML SOLN COMPARISON:  Chest x-ray 08/24/2018, CT chest 12/08/2006 FINDINGS: Cardiovascular: Satisfactory opacification of the pulmonary arteries to the segmental level. No evidence of pulmonary embolism. Nonaneurysmal aorta. Suboptimal contrast opacification of the aorta. Moderate aortic atherosclerosis. Displacement of wall calcification at the distal arch and descending thoracic aorta, best seen on sagittal views concerning for intramural hematoma or possible dissection. Further evaluation limited due to suboptimal contrast opacification of the aorta. Heart size within normal limits. No pericardial effusion. Coronary  vascular calcification. Mediastinum/Nodes: Midline trachea. No thyroid mass. No significant adenopathy. Esophagus within normal limits. Lungs/Pleura: Lungs are clear. No pleural effusion or pneumothorax. Upper Abdomen: No acute abnormality. Musculoskeletal: No chest wall abnormality. No acute or significant osseous findings. Review of the MIP images confirms the above findings. IMPRESSION: 1. Negative for acute pulmonary embolus. 2. Poorly opacified aorta however crescent-shaped high density with displacement of wall calcification at the distal arch and descending thoracic aorta is concerning for either acute intramural hematoma or dissection. Repeat CTA with dissection technique is advised. Critical Value/emergent results were called by telephone at the time of interpretation on 08/25/2018 at 2:17 am to Dr. York Cerise , who verbally acknowledged these results. Aortic  Atherosclerosis (ICD10-I70.0). Electronically Signed   By: Jasmine Pang M.D.   On: 08/25/2018 02:17   Dg Chest Portable 1 View  Result Date: 08/27/2018 CLINICAL DATA:  Chest pain. EXAM: PORTABLE CHEST 1 VIEW COMPARISON:  Radiographs of August 24, 2018. CT scan of August 25, 2018. FINDINGS: Cardiac size is within normal limits. Stable enlargement of aortic arch and proximal descending thoracic aorta is noted consistent with intramural hematoma as described on prior CT scan. Both lungs are clear. No pneumothorax or pleural effusion is noted. The visualized skeletal structures are unremarkable. IMPRESSION: Stable enlargement of aortic arch and proximal descending thoracic aorta is noted consistent with history of intramural hematoma. No other abnormality is noted. Electronically Signed   By: Lupita Raider, M.D.   On: 08/27/2018 15:42   Ct Angio Chest/abd/pel For Dissection W And/or W/wo  Result Date: 08/25/2018 CLINICAL DATA:  62 y/o M; Chest pain, AAS suspected, hemodynamically stable, no prior aorta intervention. EXAM: CT ANGIOGRAPHY CHEST, ABDOMEN AND PELVIS TECHNIQUE: Multidetector CT imaging through the chest, abdomen and pelvis was performed using the standard protocol during bolus administration of intravenous contrast. Multiplanar reconstructed images and MIPs were obtained and reviewed to evaluate the vascular anatomy. CONTRAST:  ISOVUE-370 IOPAMIDOL (ISOVUE-370) INJECTION 76% COMPARISON:  12/08/2006 CT chest. 12/21/2007 CT abdomen and pelvis. FINDINGS: CTA CHEST FINDINGS Cardiovascular: Wall thickening of the distal aortic arch extending from downstream to the left subclavian artery origin to the midthoracic level with slightly hyperintense attenuation to the blood pool on noncontrast CT and no enhancement after administration of intravenous contrast. Findings are compatible with an intramural hematoma. There is a shallow penetrating ulcer directed superiorly of the distal arch (series 6,  image 64). No dissection flap propagates upstream into the ascending arch or downstream into the abdomen. Great vessel origins are widely patent. No aortic aneurysm. Normal caliber main pulmonary artery. Normal heart size. No pericardial effusion. Mediastinum/Nodes: No enlarged mediastinal, hilar, or axillary lymph nodes. Thyroid gland, trachea, and esophagus demonstrate no significant findings. Lungs/Pleura: Lungs are clear. No pleural effusion or pneumothorax. Musculoskeletal: No chest wall abnormality. No acute or significant osseous findings. Review of the MIP images confirms the above findings. CTA ABDOMEN AND PELVIS FINDINGS VASCULAR Aorta: Normal caliber aorta without aneurysm, dissection, vasculitis or significant stenosis. Extensive calcific atherosclerosis. Celiac: Common celiac and SMA root. Left gastric artery arises upstream from the aorta. SMA: Patent without evidence of aneurysm, dissection, vasculitis or significant stenosis. Common celiac SMA root. Renals: Both renal arteries are patent without evidence of aneurysm, dissection, vasculitis, fibromuscular dysplasia or significant stenosis. IMA: Patent without evidence of aneurysm, dissection, vasculitis or significant stenosis. 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 Normal caliber left common iliac artery. Normal caliber external and internal iliac arteries. Veins: No obvious venous abnormality  within the limitations of this arterial phase study. Review of the MIP images confirms the above findings. NON-VASCULAR Hepatobiliary: No focal liver abnormality is seen. No gallstones, gallbladder wall thickening, or biliary dilatation. Pancreas: Pancreatic atrophy. No main duct dilatation. Spleen: Normal in size without focal abnormality. Adrenals/Urinary Tract: Adrenal glands are unremarkable. Orthotopic right kidney. Ectopic left kidney in the right lower quadrant inferior to the orthotopic right kidney. Ectopic left kidney extrarenal  pelvis. No hydronephrosis. No focal kidney lesion. Small left posterior bladder diverticulum. Stomach/Bowel: Stomach is within normal limits. Appendix appears normal. No evidence of bowel wall thickening, distention, or inflammatory changes. Lymphatic: No lymphadenopathy. Reproductive: Normal. Other: No abdominal wall hernia or abnormality. No abdominopelvic ascites. Musculoskeletal: No fracture is seen. Review of the MIP images confirms the above findings. IMPRESSION: 1. Intramural hematoma within the distal aortic arch extending from downstream to the left subclavian artery to the midthoracic level. 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 2. No evidence of dissection flap, aortic aneurysm, or additional acute vascular abnormality. Widely patent great vessel origins. 3. 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 These results were called by telephone at the time of interpretation on 08/25/2018 at 3:03 am to Dr. Loleta Rose , who verbally acknowledged these results. Electronically Signed   By: Mitzi Hansen M.D.   On: 08/25/2018 03:09    DISCHARGE EXAMINATION: Vitals:   08/30/18 0103 08/30/18 0137 08/30/18 0432 08/30/18 0921  BP: (!) 171/109 (!) 158/100 (!) 126/93 125/78  Pulse: 79 87 94 97  Resp:  18 18 18   Temp:   98.3 F (36.8 C) 98.2 F (36.8 C)  TempSrc:    Oral  SpO2: 100% 100% 97% 98%  Weight:      Height:       General appearance: alert, cooperative, appears stated age and no distress Resp: clear to auscultation bilaterally Cardio: regular rate and rhythm, S1, S2 normal, no murmur, click, rub or gallop GI: soft, non-tender; bowel sounds normal; no masses,  no organomegaly  DISPOSITION: Home  Discharge Instructions    Call MD for:  difficulty breathing, headache or visual disturbances   Complete by:  As directed    Call MD for:  extreme fatigue   Complete by:  As directed    Call MD for:  persistant dizziness or light-headedness   Complete  by:  As directed    Call MD for:  persistant nausea and vomiting   Complete by:  As directed    Call MD for:  severe uncontrolled pain   Complete by:  As directed    Call MD for:  temperature >100.4   Complete by:  As directed    Diet - low sodium heart healthy   Complete by:  As directed    Discharge instructions   Complete by:  As directed    Please take your medications as prescribed. Keep your appointments. Call the vascular surgeon's office to schedule appointment if you don't hear from them in a few days.  You were cared for by a hospitalist during your hospital stay. If you have any questions about your discharge medications or the care you received while you were in the hospital after you are discharged, you can call the unit and asked to speak with the hospitalist on call if the hospitalist that took care of you is not available. Once you are discharged, your primary care physician will handle any further medical issues. Please note that NO REFILLS for any  discharge medications will be authorized once you are discharged, as it is imperative that you return to your primary care physician (or establish a relationship with a primary care physician if you do not have one) for your aftercare needs so that they can reassess your need for medications and monitor your lab values. If you do not have a primary care physician, you can call [phone] for a physician referral.   Increase activity slowly   Complete by:  As directed         Allergies as of 08/30/2018      Reactions   Codeine Phosphate    REACTION: unspecified      Medication List    STOP taking these medications   aspirin 81 MG chewable tablet   dextrose 5 % and 0.45% NaCl infusion   esmolol 2000 mg / 100 mL infusion Commonly known as:  BREVIBLOC   famotidine 20-0.9 MG/50ML-% Commonly known as:  PEPCID   LORazepam 1 MG tablet Commonly known as:  ATIVAN   niCARdipine in saline 20-0.86 MG/200ML-% Soln Commonly  known as:  CARDENE-IV   nicotine 21 mg/24hr patch Commonly known as:  NICODERM CQ - dosed in mg/24 hours Replaced by:  nicotine 14 mg/24hr patch   thiamine 100 MG/ML injection Commonly known as:  B-1 Replaced by:  thiamine 100 MG tablet     TAKE these medications   amLODipine 10 MG tablet Commonly known as:  NORVASC Take 1 tablet (10 mg total) by mouth daily.   carvedilol 6.25 MG tablet Commonly known as:  COREG Take 1 tablet (6.25 mg total) by mouth 2 (two) times daily with a meal.   multivitamin with minerals Tabs tablet Take 1 tablet by mouth daily.   nicotine 14 mg/24hr patch Commonly known as:  NICODERM CQ - dosed in mg/24 hours Place 1 patch (14 mg total) onto the skin daily. Replaces:  nicotine 21 mg/24hr patch   thiamine 100 MG tablet Take 1 tablet (100 mg total) by mouth daily. Replaces:  thiamine 100 MG/ML injection        Follow-up Information    Primary Care at [facility]. Go on 09/02/2018.   Why:  at 10:30 am for hospital followup appointment Contact information: [address]  [place],  [place]  [address] [phone]       [facility] Follow up.   Why:  for your prescription needs. Medications $4-10. Contact information: [address] [place] [address] [place] [address] [phone]       Chuck Hint, MD. Schedule an appointment as soon as possible for a visit in 1 month(s).   Specialties:  Vascular Surgery, Cardiology Contact information: [address] [place] [address] [phone]           TOTAL DISCHARGE TIME: 35 mins  Osvaldo Shipper  [facility] Pager [phone]  08/30/2018, 1:09 PM

## 2018-08-30 NOTE — Progress Notes (Signed)
Lab called with Troponin of 0.03. This was down from 0.04 on 08/25/18.

## 2018-08-30 NOTE — Progress Notes (Signed)
K.Sofia (on call) texted re: pt c/o left sided chest pain, above nipple line. Pain does not radiate, feels like an ache/throbbing. Sitting up/rocking makes pain feel better. Pain comes/goes. Feels like indigestion. VS: 79-18-171/109  pOx 100% on RA.

## 2018-08-30 NOTE — Care Management (Signed)
Pt given MATCH letter to cover prescriptions.  Pt states he is able to pay the $3 copay- total for prescriptions should be less than $15.  Pt is aware that  BP meds are $4 at [facility].  Other prescriptions (MVI, Thiamine, Nicotine Patches) given today may not be covered by [facility]- pt is aware.  Pt given information about smoking cessation classes through [facility] where patches are supplied when class is attended.

## 2018-09-01 ENCOUNTER — Other Ambulatory Visit: Payer: Self-pay

## 2018-09-01 DIAGNOSIS — I700 Atherosclerosis of aorta: Principal | ICD-10-CM

## 2018-09-01 DIAGNOSIS — I719 Aortic aneurysm of unspecified site, without rupture: Secondary | ICD-10-CM

## 2018-09-02 ENCOUNTER — Telehealth: Payer: Self-pay | Admitting: Vascular Surgery

## 2018-09-02 ENCOUNTER — Ambulatory Visit: Payer: Self-pay | Admitting: Family Medicine

## 2018-09-02 NOTE — Telephone Encounter (Signed)
sch appt spk to pt wife mld ltr 09/23/18 2pm CTA chest 09/29/18 315pm f/u MD

## 2018-09-02 NOTE — Telephone Encounter (Signed)
-----   Message from Cephus Shelling, MD sent at 08/30/2018 11:03 AM EDT ----- Can you arrange Mr. Bones to see me in follow-up in one month with CTA chest?  Thanks,  Thayer Ohm

## 2018-09-14 ENCOUNTER — Ambulatory Visit: Payer: Self-pay | Admitting: Family Medicine

## 2018-09-23 ENCOUNTER — Other Ambulatory Visit: Payer: Self-pay

## 2018-09-29 ENCOUNTER — Ambulatory Visit: Payer: Self-pay | Admitting: Vascular Surgery

## 2018-12-14 ENCOUNTER — Emergency Department
Admission: EM | Admit: 2018-12-14 | Discharge: 2018-12-14 | Disposition: A | Discharge status: Home / Self Care | Payer: Medicaid Other | Attending: Emergency Medicine | Admitting: Emergency Medicine

## 2018-12-14 ENCOUNTER — Encounter: Payer: Self-pay | Admitting: *Deleted

## 2018-12-14 ENCOUNTER — Emergency Department: Payer: Medicaid Other

## 2018-12-14 ENCOUNTER — Other Ambulatory Visit: Payer: Self-pay

## 2018-12-14 DIAGNOSIS — F17210 Nicotine dependence, cigarettes, uncomplicated: Secondary | ICD-10-CM | POA: Diagnosis not present

## 2018-12-14 DIAGNOSIS — I10 Essential (primary) hypertension: Secondary | ICD-10-CM | POA: Diagnosis not present

## 2018-12-14 DIAGNOSIS — R55 Syncope and collapse: Secondary | ICD-10-CM | POA: Diagnosis not present

## 2018-12-14 DIAGNOSIS — F1210 Cannabis abuse, uncomplicated: Secondary | ICD-10-CM | POA: Insufficient documentation

## 2018-12-14 DIAGNOSIS — R42 Dizziness and giddiness: Secondary | ICD-10-CM | POA: Diagnosis not present

## 2018-12-14 DIAGNOSIS — R4182 Altered mental status, unspecified: Secondary | ICD-10-CM | POA: Diagnosis present

## 2018-12-14 DIAGNOSIS — Z79899 Other long term (current) drug therapy: Secondary | ICD-10-CM | POA: Insufficient documentation

## 2018-12-14 DIAGNOSIS — F10920 Alcohol use, unspecified with intoxication, uncomplicated: Secondary | ICD-10-CM | POA: Insufficient documentation

## 2018-12-14 DIAGNOSIS — R000 Tachycardia, unspecified: Secondary | ICD-10-CM | POA: Diagnosis not present

## 2018-12-14 LAB — URINALYSIS, COMPLETE (UACMP) WITH MICROSCOPIC
Bacteria, UA: NONE SEEN
Bilirubin Urine: NEGATIVE
Glucose, UA: 50 mg/dL — AB
Ketones, ur: NEGATIVE mg/dL
Leukocytes, UA: NEGATIVE
NITRITE: NEGATIVE
Protein, ur: NEGATIVE mg/dL
Specific Gravity, Urine: 1.008 (ref 1.005–1.030)
pH: 7 (ref 5.0–8.0)

## 2018-12-14 LAB — COMPREHENSIVE METABOLIC PANEL
ALK PHOS: 83 U/L (ref 38–126)
ALT: 15 U/L (ref 0–44)
AST: 18 U/L (ref 15–41)
Albumin: 4.1 g/dL (ref 3.5–5.0)
Anion gap: 7 (ref 5–15)
BILIRUBIN TOTAL: 0.7 mg/dL (ref 0.3–1.2)
BUN: 14 mg/dL (ref 6–20)
CALCIUM: 8.7 mg/dL — AB (ref 8.9–10.3)
CO2: 26 mmol/L (ref 22–32)
CREATININE: 1.43 mg/dL — AB (ref 0.61–1.24)
Chloride: 101 mmol/L (ref 98–111)
GFR calc Af Amer: >60 mL/min (ref >60)
GFR, EST NON AFRICAN AMERICAN: 53 mL/min — AB (ref >60)
Glucose, Bld: 94 mg/dL (ref 70–99)
POTASSIUM: 3.4 mmol/L — AB (ref 3.5–5.1)
Sodium: 134 mmol/L — ABNORMAL LOW (ref 135–145)
TOTAL PROTEIN: 7.2 g/dL (ref 6.5–8.1)

## 2018-12-14 LAB — CBC WITH DIFFERENTIAL/PLATELET
Abs Immature Granulocytes: 0.02 10*3/uL (ref 0.00–0.07)
Basophils Absolute: 0.1 10*3/uL (ref 0.0–0.1)
Basophils Relative: 2 %
EOS ABS: 1 10*3/uL — AB (ref 0.0–0.5)
EOS PCT: 13 %
HEMATOCRIT: 39 % (ref 39.0–52.0)
Hemoglobin: 12.5 g/dL — ABNORMAL LOW (ref 13.0–17.0)
IMMATURE GRANULOCYTES: 0 %
LYMPHS ABS: 1.5 10*3/uL (ref 0.7–4.0)
Lymphocytes Relative: 19 %
MCH: 32 pg (ref 26.0–34.0)
MCHC: 32.1 g/dL (ref 30.0–36.0)
MCV: 99.7 fL (ref 80.0–100.0)
MONOS PCT: 9 %
Monocytes Absolute: 0.7 10*3/uL (ref 0.1–1.0)
Neutro Abs: 4.4 10*3/uL (ref 1.7–7.7)
Neutrophils Relative %: 57 %
Platelets: 207 10*3/uL (ref 150–400)
RBC: 3.91 MIL/uL — ABNORMAL LOW (ref 4.22–5.81)
RDW: 14.1 % (ref 11.5–15.5)
WBC: 7.7 10*3/uL (ref 4.0–10.5)
nRBC: 0 % (ref 0.0–0.2)

## 2018-12-14 LAB — TROPONIN I: Troponin I: 0.04 ng/mL (ref <0.03)

## 2018-12-14 LAB — TSH: TSH: 9.949 u[IU]/mL — ABNORMAL HIGH (ref 0.350–4.500)

## 2018-12-14 MED ORDER — SODIUM CHLORIDE 0.9 % IV BOLUS
1000.0000 mL | Freq: Once | INTRAVENOUS | Status: AC
  Administered 2018-12-14: 1000 mL via INTRAVENOUS

## 2018-12-14 NOTE — ED Notes (Signed)
Pt is walked outside to taxi by [employer]

## 2018-12-14 NOTE — ED Notes (Signed)
Lab called with a critical trop of 0.04. Provider made aware

## 2018-12-14 NOTE — ED Triage Notes (Signed)
Pt was brought in by [facility] after wife called them out due to pt AMS.  Pt had a fall today, pt denies a fall and states that he just sat down.  EMS found that pt was orthostatic and states that his systolic BP when he was stood up was 70 with a HR or 118.  Pt is alert and oriented x4 on arrival.  No CP.  Pt appears a bit wet but denies incontinence and states that it is from sitting on ground.  CBG was 113 for ems.  20g in LAC

## 2018-12-14 NOTE — ED Provider Notes (Signed)
[facility] Emergency Department Provider Note  ____________________________________________   First MD Initiated Contact with Patient 12/14/18 1731     (approximate)  I have reviewed the triage vital signs and the nursing notes.   HISTORY  Chief Complaint Altered Mental Status   HPI [name] is a 63 y.o. male with a history of hypertension was presented emergency department today with complaint of altered mental status was a fall.  The patient is denying any pain and says that he was feeling lightheaded and so he sat down in his front yard.  He denies hitting his head.  Denies any loss of consciousness.  Denies any palpitations, shortness of breath or chest pain.   Past Medical History:  Diagnosis Date  . Hypertension     Patient Active Problem List   Diagnosis Date Noted  . Hypertensive urgency 08/27/2018  . 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 Penetrating atherosclerotic ulcer of aorta (HCC)   . LOW BACK PAIN, CHRONIC 11/30/2010  . DEPRESSION 03/18/2008  . PANCREATITIS, CHRONIC 03/18/2008  . RESTLESS LEG SYNDROME 06/18/2007  . ALCOHOLISM 01/30/2007  . TOBACCO ABUSE 01/30/2007  . HYPERTENSION 01/30/2007  . COPD 01/30/2007  . CARDIOMYOPATHY 05/30/1998    History reviewed. No pertinent surgical history.  Prior to Admission medications   Medication Sig Start Date End Date Taking? Authorizing Provider  amLODipine (NORVASC) 10 MG tablet Take 1 tablet (10 mg total) by mouth daily. 08/30/18   Osvaldo Shipper, MD  carvedilol (COREG) 6.25 MG tablet Take 1 tablet (6.25 mg total) by mouth 2 (two) times daily with a meal. 08/30/18   Osvaldo Shipper, MD  Multiple Vitamin (MULTIVITAMIN WITH MINERALS) TABS tablet Take 1 tablet by mouth daily. 08/30/18   Osvaldo Shipper, MD  nicotine (NICODERM CQ - DOSED IN MG/24 HOURS) 14 mg/24hr patch Place 1 patch (14 mg total) onto the skin daily. 08/30/18    Osvaldo Shipper, MD  thiamine 100 MG tablet Take 1 tablet (100 mg total) by mouth daily. 08/30/18   Osvaldo Shipper, MD    Allergies Codeine phosphate  No family history on file.  Social History Social History   Tobacco Use  . Smoking status: Current Every Day Smoker    Packs/day: 1.00  . Smokeless tobacco: Never Used  Substance Use Topics  . Alcohol use: Yes    Alcohol/week: 18.0 standard drinks    Types: 18 Cans of beer per week  . Drug use: Yes    Types: Marijuana    Review of Systems  Constitutional: No fever/chills Eyes: No visual changes. ENT: No sore throat. Cardiovascular: Denies chest pain. Respiratory: Denies shortness of breath. Gastrointestinal: No abdominal pain.  No nausea, no vomiting.  No diarrhea.  No constipation. Genitourinary: Negative for dysuria. Musculoskeletal: Negative for back pain. Skin: Negative for rash. Neurological: Negative for headaches, focal weakness or numbness.   ____________________________________________   PHYSICAL EXAM:  VITAL SIGNS: ED Triage Vitals  Enc Vitals Group     BP 12/14/18 1742 (!) 180/120     Pulse Rate 12/14/18 1742 83     Resp 12/14/18 1742 16     Temp 12/14/18 1742 97.8 F (36.6 C)     Temp Source 12/14/18 1742 Oral     SpO2 12/14/18 1742 98 %     Weight 12/14/18 1745 140 lb (63.5 kg)     Height 12/14/18 1745 5\' 10"  (1.778 m)     Head Circumference --  Peak Flow --      Pain Score 12/14/18 1745 0     Pain Loc --      Pain Edu? --      Excl. in GC? --     Constitutional: Alert and oriented. Well appearing and in no acute distress. Eyes: Conjunctivae are normal.  Head: Atraumatic. Nose: No congestion/rhinnorhea. Mouth/Throat: Mucous membranes are moist.  Neck: No stridor.  Tenderness to midline cervical spine.  Deformity or step-off. Cardiovascular: Normal rate, regular rhythm. Grossly normal heart sounds.   Respiratory: Normal respiratory effort.  No retractions. Lungs  CTAB. Gastrointestinal: Soft and nontender. No distention. No CVA tenderness. Musculoskeletal: No lower extremity tenderness nor edema.  No joint effusions.  5 out of 5 strength bilateral lower extremities.  No limb shortening or rotational deformity. Neurologic:  Normal speech and language. No gross focal neurologic deficits are appreciated. Skin:  Skin is warm, dry and intact. No rash noted. Psychiatric: Mood and affect are normal. Speech and behavior are normal.  ____________________________________________   LABS (all labs ordered are listed, but only abnormal results are displayed)  Labs Reviewed  CBC WITH DIFFERENTIAL/PLATELET - Abnormal; Notable for the following components:      Result Value   RBC 3.91 (*)    Hemoglobin 12.5 (*)    Eosinophils Absolute 1.0 (*)    All other components within normal limits  COMPREHENSIVE METABOLIC PANEL - Abnormal; Notable for the following components:   Sodium 134 (*)    Potassium 3.4 (*)    Creatinine, Ser 1.43 (*)    Calcium 8.7 (*)    GFR calc non Af Amer 53 (*)    All other components within normal limits  TROPONIN I - Abnormal; Notable for the following components:   Troponin I 0.04 (*)    All other components within normal limits  URINALYSIS, COMPLETE (UACMP) WITH MICROSCOPIC - Abnormal; Notable for the following components:   Color, Urine STRAW (*)    APPearance CLEAR (*)    Glucose, UA 50 (*)    Hgb urine dipstick SMALL (*)    All other components within normal limits  TSH - Abnormal; Notable for the following components:   TSH 9.949 (*)    All other components within normal limits   ____________________________________________  EKG  ED ECG REPORT I, Arelia Longest, the attending physician, personally viewed and interpreted this ECG.   Date: 12/14/2018  EKG Time: 1739  Rate: 87  Rhythm: normal sinus rhythm  Axis: Normal  Intervals:none  ST&T Change: No ST segment elevation or depression.  No abnormal T wave  inversion.  ____________________________________________  RADIOLOGY  Chest x-ray as well as CT head without acute process. ____________________________________________   PROCEDURES  Procedure(s) performed:   Procedures  Critical Care performed:   ____________________________________________   INITIAL IMPRESSION / ASSESSMENT AND PLAN / ED COURSE  Pertinent labs & imaging results that were available during my care of the patient were reviewed by me and considered in my medical decision making (see chart for details).  DDX: Alcohol intoxication, syncope, mechanical fall, weakness, hypotension, electrolyte abnormality, ACS, UTI As part of my medical decision making, I reviewed the following data within the electronic MEDICAL RECORD NUMBER Notes from prior ED visits  ----------------------------------------- 8:38 PM on 12/14/2018 -----------------------------------------  Patient walking throughout the department without issue.  Says that he no longer feels confused and now is admitting to drinking and saying that he had "a little too much" earlier today.  Patient with labs  at baseline.  Does not appear to have any traumatic injury and with reassuring images.  He will be discharged at this time. ____________________________________________   FINAL CLINICAL IMPRESSION(S) / ED DIAGNOSES  Alcohol intoxication.  Near syncope.  NEW MEDICATIONS STARTED DURING THIS VISIT:  New Prescriptions   No medications on file     Note:  This document was prepared using Dragon voice recognition software and may include unintentional dictation errors.     Myrna BlazerSchaevitz, David Matthew, MD 12/14/18 2039

## 2018-12-14 NOTE — ED Notes (Signed)
Helped pt to the bathroom to change into paperscrubs.  Placed new linen on pt stretcher.  Pt denies any dizziness or weakness when getting up.  Gave pt washcloth to clean up with.

## 2018-12-14 NOTE — ED Notes (Signed)
I spoke with wife and gave verbal report.

## 2018-12-14 NOTE — ED Notes (Signed)
Blood sent to lab.  EKG done.  Gave pt phone and encouraged him to call his wife and ask her to bring some clothes for him to change into.

## 2019-01-11 ENCOUNTER — Ambulatory Visit: Payer: Medicaid Other | Admitting: Primary Care

## 2019-01-18 ENCOUNTER — Encounter: Payer: Self-pay | Admitting: Primary Care

## 2019-01-18 ENCOUNTER — Ambulatory Visit: Payer: Medicaid Other | Admitting: Primary Care

## 2019-01-18 VITALS — BP 162/110 | HR 110 | Temp 98.3°F | Ht 70.0 in | Wt 151.5 lb

## 2019-01-18 DIAGNOSIS — I700 Atherosclerosis of aorta: Secondary | ICD-10-CM

## 2019-01-18 DIAGNOSIS — I7410 Embolism and thrombosis of unspecified parts of aorta: Secondary | ICD-10-CM | POA: Diagnosis not present

## 2019-01-18 DIAGNOSIS — I7100 Dissection of unspecified site of aorta: Secondary | ICD-10-CM | POA: Diagnosis not present

## 2019-01-18 DIAGNOSIS — I719 Aortic aneurysm of unspecified site, without rupture: Secondary | ICD-10-CM

## 2019-01-18 DIAGNOSIS — I10 Essential (primary) hypertension: Secondary | ICD-10-CM | POA: Diagnosis not present

## 2019-01-18 LAB — COMPREHENSIVE METABOLIC PANEL
ALBUMIN: 4.4 g/dL (ref 3.5–5.2)
ALT: 16 U/L (ref 0–53)
AST: 31 U/L (ref 0–37)
Alkaline Phosphatase: 103 U/L (ref 39–117)
BUN: 12 mg/dL (ref 6–23)
CO2: 25 mEq/L (ref 19–32)
Calcium: 9.9 mg/dL (ref 8.4–10.5)
Chloride: 94 mEq/L — ABNORMAL LOW (ref 96–112)
Creatinine, Ser: 1.24 mg/dL (ref 0.40–1.50)
GFR: 59.26 mL/min — ABNORMAL LOW (ref >60.00)
Glucose, Bld: 87 mg/dL (ref 70–99)
Potassium: 4.3 mEq/L (ref 3.5–5.1)
Sodium: 131 mEq/L — ABNORMAL LOW (ref 135–145)
Total Bilirubin: 0.5 mg/dL (ref 0.2–1.2)
Total Protein: 7.9 g/dL (ref 6.0–8.3)

## 2019-01-18 LAB — LIPID PANEL
CHOLESTEROL: 182 mg/dL (ref 0–200)
HDL: 74.2 mg/dL (ref >39.00)
LDL Cholesterol: 90 mg/dL (ref 0–99)
NonHDL: 107.87
Total CHOL/HDL Ratio: 2
Triglycerides: 87 mg/dL (ref 0.0–149.0)
VLDL: 17.4 mg/dL (ref 0.0–40.0)

## 2019-01-18 MED ORDER — AMLODIPINE BESYLATE 10 MG PO TABS: 10.0000 mg | ORAL_TABLET | Freq: Every day | ORAL | 3 refills | Status: DC

## 2019-01-18 MED ORDER — CARVEDILOL 6.25 MG PO TABS: 6.2500 mg | ORAL_TABLET | Freq: Two times a day (BID) | ORAL | 3 refills | Status: DC

## 2019-01-18 NOTE — Assessment & Plan Note (Signed)
Referral placed to vascular surgery for further management. Will likely need statin therapy. LFT's and lipid panel pending.

## 2019-01-18 NOTE — Assessment & Plan Note (Signed)
Referral placed to vascular surgery for further management.

## 2019-01-18 NOTE — Assessment & Plan Note (Signed)
Referral placed to vascular surgery for further management. 

## 2019-01-18 NOTE — Patient Instructions (Addendum)
Stop by the lab prior to leaving today. I will notify you of your results once received.   Resume Amlodipine 10 mg tablets for blood pressure, take 1 tablet once daily.  Resume carvedilol 6.25 mg twice daily for blood pressure and heart rate.  Schedule a follow up visit for 3 weeks for blood pressure check.  It was a pleasure to meet you today! Please don't hesitate to call or message me with any questions. Welcome to [facility]!

## 2019-01-18 NOTE — Assessment & Plan Note (Signed)
Chronic, encouraged cessation.

## 2019-01-18 NOTE — Assessment & Plan Note (Signed)
Uncontrolled, off meds for the last 4-5 months. Refills provided today for both amlodipine and carvedilol. We will plan to see him back in 3 weeks for BP check.

## 2019-01-18 NOTE — Progress Notes (Signed)
Subjective:    Patient ID: [name], male    DOB: 03/17/1956, 63 y.o.   MRN: [id number]  HPI  Mr. Gohr is a 63 year old male who presents today to establish care and discuss the problems mentioned below. Will obtain old records.  1) Hypertension: Currently prescribed amlodipine 10 mg and carvedilol 6.25 mg BID. Admitted in October 2019 for hypertensive crisis. Work up included transthoracic echo with normal LV function, normal wall motion. He was treated with labetalol infusion, also initiated on Amlodipine 10 mg and carvedilol 6.25 mg BID.   Today he endorses being out both amlodipine and carvedilol for the last 4-5 months as he didn't have a PCP or insurance. He denies headaches, dizziness, chest pain.   BP Readings from Last 3 Encounters:  01/18/19 (!) 162/110  12/14/18 (!) 173/126  08/30/18 125/78     2) CKD: Creatinine ranging from 1.3-1.4 with GFR in the 50's over the last several months. He denies difficulty with urination, foul smelling urine, discolored urine.   3) Alcohol Abuse: Evaluated at [facility] ED on 12/14/18 for altered mental status due to fall. He endorsed alcohol intoxication prior to incident. Work up including chest xray and CT head were negative. Given unremarkable work up and improved mentation he was discharged home.   He continues to drink alcohol 4-5 times weekly.   4) Penetrating atherosclerotic ulcer of aorta/aortic mural thrombus: Diagnosed in October 2019 per CT angio during hospital admission. 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 Treated with Cardene and esmolol drips, transferred to [facility] and treated. Discharged home with amlodipine and carvedilol. It was recommended he follow up with vascular surgery upon discharge.  Today he endorses that he never followed up due to insurance issues. He denies shortness of breath, chest pain.   5) COPD: Current cigarette smoker, 40 pack year history. CT angio chest in October 2019 without  nodules. He is open to quitting smoking. He's tried nicotine patches in the past which causes skin breakouts.   Review of Systems  Constitutional: Negative for unexpected weight change.  Eyes: Negative for visual disturbance.  Respiratory: Negative for shortness of breath.   Cardiovascular: Negative for chest pain.  Neurological: Negative for dizziness and headaches.       Past Medical History:  Diagnosis Date  . Hypertension      Social History   Socioeconomic History  . Marital status: Married    Spouse name: Not on file  . Number of children: Not on file  . Years of education: Not on file  . Highest education level: Not on file  Occupational History  . Not on file  Social Needs  . Financial resource strain: Not on file  . Food insecurity:    Worry: Not on file    Inability: Not on file  . Transportation needs:    Medical: Not on file    Non-medical: Not on file  Tobacco Use  . Smoking status: Current Every Day Smoker    Packs/day: 1.00  . Smokeless tobacco: Never Used  Substance and Sexual Activity  . Alcohol use: Yes    Alcohol/week: 18.0 standard drinks    Types: 18 Cans of beer per week  . Drug use: Yes    Types: Marijuana  . Sexual activity: Not on file  Lifestyle  . Physical activity:    Days per week: Not on file    Minutes per session: Not on file  . Stress: Not on  file  Relationships  . Social connections:    Talks on phone: Not on file    Gets together: Not on file    Attends religious service: Not on file    Active member of club or organization: Not on file    Attends meetings of clubs or organizations: Not on file    Relationship status: Not on file  . Intimate partner violence:    Fear of current or ex partner: Not on file    Emotionally abused: Not on file    Physically abused: Not on file    Forced sexual activity: Not on file  Other Topics Concern  . Not on file  Social History Narrative  . Not on file    No past surgical history  on file.  No family history on file.  Allergies  Allergen Reactions  . Codeine Phosphate     REACTION: unspecified    No current outpatient medications on file prior to visit.   No current facility-administered medications on file prior to visit.     BP (!) 162/110   Pulse (!) 110   Temp 98.3 F (36.8 C) (Oral)   Ht 5\' 10"  (1.778 m)   Wt 151 lb 8 oz (68.7 kg)   SpO2 98%   BMI 21.74 kg/m    Objective:   Physical Exam  Constitutional: He appears well-nourished.  Neck: Neck supple.  Cardiovascular: Normal rate and regular rhythm.  Respiratory: Effort normal and breath sounds normal.  Skin: Skin is warm and dry.  Psychiatric: He has a normal mood and affect.           Assessment & Plan:

## 2019-01-19 ENCOUNTER — Other Ambulatory Visit: Payer: Self-pay | Admitting: Primary Care

## 2019-01-19 DIAGNOSIS — I719 Aortic aneurysm of unspecified site, without rupture: Secondary | ICD-10-CM

## 2019-01-19 DIAGNOSIS — I10 Essential (primary) hypertension: Secondary | ICD-10-CM

## 2019-01-19 DIAGNOSIS — I700 Atherosclerosis of aorta: Principal | ICD-10-CM

## 2019-01-19 MED ORDER — ATORVASTATIN CALCIUM 20 MG PO TABS: 20.0000 mg | ORAL_TABLET | Freq: Every day | ORAL | 3 refills | Status: DC

## 2019-01-25 ENCOUNTER — Encounter (INDEPENDENT_AMBULATORY_CARE_PROVIDER_SITE_OTHER): Payer: Medicaid Other | Admitting: Vascular Surgery

## 2019-02-08 ENCOUNTER — Other Ambulatory Visit: Payer: Self-pay

## 2019-02-08 ENCOUNTER — Ambulatory Visit (INDEPENDENT_AMBULATORY_CARE_PROVIDER_SITE_OTHER): Payer: Medicaid Other | Admitting: Primary Care

## 2019-02-08 ENCOUNTER — Encounter: Payer: Self-pay | Admitting: Primary Care

## 2019-02-08 VITALS — BP 124/80 | HR 87 | Temp 98.2°F | Ht 70.0 in | Wt 144.0 lb

## 2019-02-08 DIAGNOSIS — I700 Atherosclerosis of aorta: Secondary | ICD-10-CM

## 2019-02-08 DIAGNOSIS — I719 Aortic aneurysm of unspecified site, without rupture: Secondary | ICD-10-CM

## 2019-02-08 DIAGNOSIS — I10 Essential (primary) hypertension: Secondary | ICD-10-CM

## 2019-02-08 LAB — COMPREHENSIVE METABOLIC PANEL
ALT: 20 U/L (ref 0–53)
AST: 19 U/L (ref 0–37)
Albumin: 4.1 g/dL (ref 3.5–5.2)
Alkaline Phosphatase: 95 U/L (ref 39–117)
BILIRUBIN TOTAL: 0.5 mg/dL (ref 0.2–1.2)
BUN: 10 mg/dL (ref 6–23)
CALCIUM: 9.7 mg/dL (ref 8.4–10.5)
CO2: 30 mEq/L (ref 19–32)
Chloride: 98 mEq/L (ref 96–112)
Creatinine, Ser: 1.31 mg/dL (ref 0.40–1.50)
GFR: 55.61 mL/min — AB (ref >60.00)
Glucose, Bld: 115 mg/dL — ABNORMAL HIGH (ref 70–99)
Potassium: 3.7 mEq/L (ref 3.5–5.1)
Sodium: 137 mEq/L (ref 135–145)
Total Protein: 7.2 g/dL (ref 6.0–8.3)

## 2019-02-08 LAB — LIPID PANEL
Cholesterol: 123 mg/dL (ref 0–200)
HDL: 35.1 mg/dL — AB (ref >39.00)
LDL Cholesterol: 69 mg/dL (ref 0–99)
NonHDL: 88.03
Total CHOL/HDL Ratio: 4
Triglycerides: 95 mg/dL (ref 0.0–149.0)
VLDL: 19 mg/dL (ref 0.0–40.0)

## 2019-02-08 NOTE — Progress Notes (Signed)
Subjective:    Patient ID: [name], male    DOB: 1956/10/22, 63 y.o.   MRN: [id number]  HPI  [name] is a 63 year old male who presents today for follow up. He was last evaluated as a new patient on 01/18/19.  1) Essential Hypertension: During his last visit he'd been out of medications for 4-5 months, BP elevated at 162/110. Given his elevated readings he was initiated back on amlodipine 10 mg and carvedilol 6.25 mg BID.   He is not checking his BP at home. He is compliant to his medications as prescribed. He denies headaches, dizziness, chest pain.   BP Readings from Last 3 Encounters:  02/08/19 124/80  01/18/19 (!) 162/110  12/14/18 (!) 173/126    2) Atherosclerosis: Recently initiated on atorvastatin 20 mg. Today he endorses compliance. Referred to vascular surgery last visit, he has not yet made an appointment.   Review of Systems  Respiratory: Negative for shortness of breath.   Cardiovascular: Negative for chest pain.  Neurological: Negative for dizziness and headaches.       Past Medical History:  Diagnosis Date  . Hypertension      Social History   Socioeconomic History  . Marital status: Married    Spouse name: Not on file  . Number of children: Not on file  . Years of education: Not on file  . Highest education level: Not on file  Occupational History  . Not on file  Social Needs  . Financial resource strain: Not on file  . Food insecurity:    Worry: Not on file    Inability: Not on file  . Transportation needs:    Medical: Not on file    Non-medical: Not on file  Tobacco Use  . Smoking status: Current Every Day Smoker    Packs/day: 1.00  . Smokeless tobacco: Never Used  Substance and Sexual Activity  . Alcohol use: Yes    Alcohol/week: 18.0 standard drinks    Types: 18 Cans of beer per week  . Drug use: Yes    Types: Marijuana  . Sexual activity: Not on file  Lifestyle  . Physical activity:    Days per week: Not on file    Minutes  per session: Not on file  . Stress: Not on file  Relationships  . Social connections:    Talks on phone: Not on file    Gets together: Not on file    Attends religious service: Not on file    Active member of club or organization: Not on file    Attends meetings of clubs or organizations: Not on file    Relationship status: Not on file  . Intimate partner violence:    Fear of current or ex partner: Not on file    Emotionally abused: Not on file    Physically abused: Not on file    Forced sexual activity: Not on file  Other Topics Concern  . Not on file  Social History Narrative  . Not on file    No past surgical history on file.  No family history on file.  Allergies  Allergen Reactions  . Codeine Phosphate     REACTION: unspecified    Current Outpatient Medications on File Prior to Visit  Medication Sig Dispense Refill  . amLODipine (NORVASC) 10 MG tablet Take 1 tablet (10 mg total) by mouth daily. For blood pressure. 90 tablet 3  . atorvastatin (LIPITOR) 20 MG tablet Take 1 tablet (  20 mg total) by mouth daily. For cholesterol. 90 tablet 3  . carvedilol (COREG) 6.25 MG tablet Take 1 tablet (6.25 mg total) by mouth 2 (two) times daily with a meal. For blood pressure and heart rate. 180 tablet 3   No current facility-administered medications on file prior to visit.     BP 124/80   Pulse 87   Temp 98.2 F (36.8 C) (Oral)   Ht 5\' 10"  (1.778 m)   Wt 144 lb (65.3 kg)   SpO2 98%   BMI 20.66 kg/m    Objective:   Physical Exam  Constitutional: He appears well-nourished.  Neck: Neck supple.  Cardiovascular: Normal rate and regular rhythm.  Respiratory: Effort normal and breath sounds normal.  Skin: Skin is warm and dry.           Assessment & Plan:

## 2019-02-08 NOTE — Patient Instructions (Addendum)
Continue your blood pressure medications. Continue atorvastatin (Lipitor) for plaque buildup.  Call [facility] for an appointment by calling [phone].  Stop by the lab prior to leaving today. I will notify you of your results once received.   It was a pleasure to see you today!

## 2019-02-08 NOTE — Assessment & Plan Note (Signed)
Initiated on statin therapy last visit. Repeat lipids and LFT's pending. Phone number provided for [facility] as he was referred to their office. He will call to schedule.

## 2019-02-08 NOTE — Assessment & Plan Note (Signed)
Improved since resuming Amlodipine 10 mg and carvedilol 6.25 mg BID. Continue same.

## 2019-02-09 ENCOUNTER — Other Ambulatory Visit: Payer: Self-pay | Admitting: Primary Care

## 2019-02-09 DIAGNOSIS — N289 Disorder of kidney and ureter, unspecified: Secondary | ICD-10-CM

## 2019-02-10 ENCOUNTER — Telehealth: Payer: Self-pay | Admitting: Primary Care

## 2019-02-10 NOTE — Telephone Encounter (Signed)
Best number [phone] Pt returned your call

## 2019-02-10 NOTE — Telephone Encounter (Signed)
Spoken and notified patient of Kate Clark's comments. Patient verbalized understanding.  

## 2019-04-12 ENCOUNTER — Other Ambulatory Visit (INDEPENDENT_AMBULATORY_CARE_PROVIDER_SITE_OTHER): Payer: Medicaid Other

## 2019-04-12 ENCOUNTER — Other Ambulatory Visit: Payer: Self-pay | Admitting: Primary Care

## 2019-04-12 DIAGNOSIS — N289 Disorder of kidney and ureter, unspecified: Secondary | ICD-10-CM | POA: Diagnosis not present

## 2019-04-12 LAB — BASIC METABOLIC PANEL
BUN: 15 mg/dL (ref 6–23)
CO2: 27 mEq/L (ref 19–32)
Calcium: 9.8 mg/dL (ref 8.4–10.5)
Chloride: 93 mEq/L — ABNORMAL LOW (ref 96–112)
Creatinine, Ser: 1.29 mg/dL (ref 0.40–1.50)
GFR: 56.58 mL/min — ABNORMAL LOW (ref >60.00)
Glucose, Bld: 201 mg/dL — ABNORMAL HIGH (ref 70–99)
Potassium: 3.9 mEq/L (ref 3.5–5.1)
Sodium: 129 mEq/L — ABNORMAL LOW (ref 135–145)

## 2019-04-14 ENCOUNTER — Other Ambulatory Visit: Payer: Self-pay | Admitting: Primary Care

## 2019-04-14 DIAGNOSIS — N183 Chronic kidney disease, stage 3 unspecified: Secondary | ICD-10-CM

## 2019-04-29 ENCOUNTER — Encounter (INDEPENDENT_AMBULATORY_CARE_PROVIDER_SITE_OTHER): Payer: Medicaid Other | Admitting: Vascular Surgery

## 2019-05-17 ENCOUNTER — Encounter (INDEPENDENT_AMBULATORY_CARE_PROVIDER_SITE_OTHER): Payer: Medicaid Other | Admitting: Vascular Surgery

## 2019-05-26 ENCOUNTER — Telehealth: Payer: Self-pay | Admitting: Primary Care

## 2019-05-26 DIAGNOSIS — E871 Hypo-osmolality and hyponatremia: Secondary | ICD-10-CM

## 2019-05-26 DIAGNOSIS — N289 Disorder of kidney and ureter, unspecified: Secondary | ICD-10-CM

## 2019-05-26 NOTE — Telephone Encounter (Signed)
[name], please try to contact patient and notify him of Marion's message. Is he willing to see the vascular center and kidney doctor? I recommend both.

## 2019-05-26 NOTE — Telephone Encounter (Signed)
[name], see below.

## 2019-05-26 NOTE — Telephone Encounter (Signed)
Patient was scheduled for two Appts with [facility] and cancelled both Appts. [facility] has called the patient 3 times to schedule and have left messages for him to call them back and he has not. Tried calling patient and had to leave a message.

## 2019-05-26 NOTE — Telephone Encounter (Signed)
Spoken and notified patient of Ian Millers comments. Patient verbalized understanding. Patient is still agreeable to see the vascular and kidney doctor. Patient stated that they will have to call him, he is not good keep up with numbers. Patient stated that he had to cancel due to transportation.

## 2019-05-28 NOTE — Telephone Encounter (Signed)
[name] you need to place a New Nephrology Referral as they closed the last one out. [facility] says they will call the patient today to schedule but he has already cancelled twice and this will be the last time they can schedule him.

## 2019-05-28 NOTE — Telephone Encounter (Signed)
Noted, referral placed.  

## 2019-07-07 ENCOUNTER — Telehealth: Payer: Self-pay | Admitting: Primary Care

## 2019-07-07 NOTE — Telephone Encounter (Signed)
Vascular referral denied/closed. Pt No Showed 3/16", "Rejection Reason - Pt did not respond - keeps cancelling appts" [facility]

## 2019-07-07 NOTE — Telephone Encounter (Signed)
See last phone note.

## 2019-07-07 NOTE — Telephone Encounter (Signed)
Noted  

## 2019-10-28 ENCOUNTER — Telehealth: Payer: Self-pay

## 2019-10-28 NOTE — Telephone Encounter (Signed)
Noted and unfortunately this is not surprising as he has a long history of medical non compliance.

## 2019-10-28 NOTE — Telephone Encounter (Signed)
[place] nurse case mgr for [facility] said that she spoke with pt and on med review pts present med bottles are dated 03/08/ 20 or 01/18/19 and pt is forgetting to take carvedilol,atorvastatin and amlodipine. Christy reviewed ways for pt to remember to take his meds and will FU with pt later but wanted Gentry Fitz NP to be aware pt is not taking his meds.

## 2019-12-21 ENCOUNTER — Ambulatory Visit (INDEPENDENT_AMBULATORY_CARE_PROVIDER_SITE_OTHER): Payer: Medicaid Other | Admitting: Primary Care

## 2019-12-21 ENCOUNTER — Other Ambulatory Visit: Payer: Self-pay

## 2019-12-21 VITALS — BP 124/84 | HR 84 | Temp 96.3°F | Ht 70.0 in | Wt 159.0 lb

## 2019-12-21 DIAGNOSIS — I719 Aortic aneurysm of unspecified site, without rupture: Secondary | ICD-10-CM

## 2019-12-21 DIAGNOSIS — N644 Mastodynia: Secondary | ICD-10-CM | POA: Insufficient documentation

## 2019-12-21 LAB — LIPID PANEL
Cholesterol: 125 mg/dL (ref 0–200)
HDL: 57.7 mg/dL (ref >39.00)
LDL Cholesterol: 57 mg/dL (ref 0–99)
NonHDL: 67.34
Total CHOL/HDL Ratio: 2
Triglycerides: 54 mg/dL (ref 0.0–149.0)
VLDL: 10.8 mg/dL (ref 0.0–40.0)

## 2019-12-21 LAB — CBC
HCT: 45 % (ref 39.0–52.0)
Hemoglobin: 15.3 g/dL (ref 13.0–17.0)
MCHC: 34.1 g/dL (ref 30.0–36.0)
MCV: 97.8 fl (ref 78.0–100.0)
Platelets: 213 10*3/uL (ref 150.0–400.0)
RBC: 4.6 Mil/uL (ref 4.22–5.81)
RDW: 15 % (ref 11.5–15.5)
WBC: 7.6 10*3/uL (ref 4.0–10.5)

## 2019-12-21 LAB — COMPREHENSIVE METABOLIC PANEL
ALT: 26 U/L (ref 0–53)
AST: 27 U/L (ref 0–37)
Albumin: 4.6 g/dL (ref 3.5–5.2)
Alkaline Phosphatase: 111 U/L (ref 39–117)
BUN: 13 mg/dL (ref 6–23)
CO2: 29 mEq/L (ref 19–32)
Calcium: 10 mg/dL (ref 8.4–10.5)
Chloride: 100 mEq/L (ref 96–112)
Creatinine, Ser: 1.24 mg/dL (ref 0.40–1.50)
GFR: 59.08 mL/min — ABNORMAL LOW (ref >60.00)
Glucose, Bld: 102 mg/dL — ABNORMAL HIGH (ref 70–99)
Potassium: 4.4 mEq/L (ref 3.5–5.1)
Sodium: 134 mEq/L — ABNORMAL LOW (ref 135–145)
Total Bilirubin: 0.5 mg/dL (ref 0.2–1.2)
Total Protein: 7.4 g/dL (ref 6.0–8.3)

## 2019-12-21 NOTE — Patient Instructions (Signed)
Stop by the lab prior to leaving today. I will notify you of your results once received.   It was a pleasure to see you today!  

## 2019-12-21 NOTE — Assessment & Plan Note (Signed)
Mostly benign exam, does have tenderness over nipples with palpation. No obvious cellulitis, rash, masses.  Check labs today.

## 2019-12-21 NOTE — Assessment & Plan Note (Signed)
He declines evaluation through vascular services despite recommendations.  Compliant to atorvastatin, repeat lipids pending.

## 2019-12-21 NOTE — Progress Notes (Signed)
Subjective:    Patient ID: [name], male    DOB: 1956/05/24, 64 y.o.   MRN: [id number]  HPI  This visit occurred during the SARS-CoV-2 public health emergency.  Safety protocols were in place, including screening questions prior to the visit, additional usage of staff PPE, and extensive cleaning of exam room while observing appropriate contact time as indicated for disinfecting solutions.   [name] is a 64 year old male with a history of medical non compliance, hypertension, cardiomyopathy, atherosclerosis, COPD, chronic alcoholism, tobacco abuse who presents today with a chief complaint of nipple tenderness.  This began about two weeks ago. He denies injury, rash, color change. He denies changes in soaps, detergents, medications, supplements. He describes his nipples as "tender to the touch". He has been taking Mucinex over the last one week for congestion.   BP Readings from Last 3 Encounters:  12/21/19 124/84  02/08/19 124/80  01/18/19 (!) 162/110     Review of Systems  Constitutional: Negative for fever.  Skin: Negative for color change and rash.       Nipple tenderness       Past Medical History:  Diagnosis Date  . Hypertension      Social History   Socioeconomic History  . Marital status: Married    Spouse name: Not on file  . Number of children: Not on file  . Years of education: Not on file  . Highest education level: Not on file  Occupational History  . Not on file  Tobacco Use  . Smoking status: Current Every Day Smoker    Packs/day: 1.00  . Smokeless tobacco: Never Used  Substance and Sexual Activity  . Alcohol use: Yes    Alcohol/week: 18.0 standard drinks    Types: 18 Cans of beer per week  . Drug use: Yes    Types: Marijuana  . Sexual activity: Not on file  Other Topics Concern  . Not on file  Social History Narrative  . Not on file   Social Determinants of Health   Financial Resource Strain:   . Difficulty of Paying Living Expenses:  Not on file  Food Insecurity:   . Worried About Charity fundraiser in the Last Year: Not on file  . Ran Out of Food in the Last Year: Not on file  Transportation Needs:   . Lack of Transportation (Medical): Not on file  . Lack of Transportation (Non-Medical): Not on file  Physical Activity:   . Days of Exercise per Week: Not on file  . Minutes of Exercise per Session: Not on file  Stress:   . Feeling of Stress : Not on file  Social Connections:   . Frequency of Communication with Friends and Family: Not on file  . Frequency of Social Gatherings with Friends and Family: Not on file  . Attends Religious Services: Not on file  . Active Member of Clubs or Organizations: Not on file  . Attends Archivist Meetings: Not on file  . Marital Status: Not on file  Intimate Partner Violence:   . Fear of Current or Ex-Partner: Not on file  . Emotionally Abused: Not on file  . Physically Abused: Not on file  . Sexually Abused: Not on file    No past surgical history on file.  No family history on file.  Allergies  Allergen Reactions  . Codeine Phosphate     REACTION: unspecified    Current Outpatient Medications on File Prior to  Visit  Medication Sig Dispense Refill  . amLODipine (NORVASC) 10 MG tablet Take 1 tablet (10 mg total) by mouth daily. For blood pressure. 90 tablet 3  . atorvastatin (LIPITOR) 20 MG tablet Take 1 tablet (20 mg total) by mouth daily. For cholesterol. 90 tablet 3  . carvedilol (COREG) 6.25 MG tablet Take 1 tablet (6.25 mg total) by mouth 2 (two) times daily with a meal. For blood pressure and heart rate. 180 tablet 3   No current facility-administered medications on file prior to visit.    BP 124/84   Pulse 84   Temp (!) 96.3 F (35.7 C) (Temporal)   Ht 5\' 10"  (1.778 m)   Wt 159 lb (72.1 kg)   SpO2 100%   BMI 22.81 kg/m    Objective:   Physical Exam  Cardiovascular: Normal rate and regular rhythm.  Respiratory: Effort normal and breath  sounds normal. Right breast exhibits tenderness. Right breast exhibits no inverted nipple, no mass, no nipple discharge and no skin change. Left breast exhibits tenderness. Left breast exhibits no inverted nipple, no mass, no nipple discharge and no skin change.    Significant odor of cigarette smoke  Musculoskeletal:     Cervical back: Neck supple.  Skin: Skin is warm and dry. No erythema.  Psychiatric: He has a normal mood and affect.           Assessment & Plan:

## 2020-03-15 ENCOUNTER — Other Ambulatory Visit: Payer: Self-pay | Admitting: Primary Care

## 2020-03-15 DIAGNOSIS — I10 Essential (primary) hypertension: Secondary | ICD-10-CM

## 2020-03-15 DIAGNOSIS — I719 Aortic aneurysm of unspecified site, without rupture: Secondary | ICD-10-CM

## 2020-03-22 ENCOUNTER — Telehealth: Payer: Self-pay | Admitting: Infectious Diseases

## 2020-03-22 NOTE — Telephone Encounter (Signed)
Noted and thank you

## 2020-03-22 NOTE — Telephone Encounter (Signed)
I attempted to reach [name] through his wife's contact on 03/22/2020 at 11:27 AM to discuss the potential vaccination through our Homebound vaccination initiative.   LVM to callback at [phone] to help arrange for her and her household if interested.

## 2020-03-23 ENCOUNTER — Telehealth: Payer: Self-pay | Admitting: Nurse Practitioner

## 2020-03-23 NOTE — Telephone Encounter (Signed)
I connected by phone with [name] and/or patient's caregiver on 03/23/2020 at 3:21 PM to discuss the potential vaccination through our Homebound vaccination initiative.   Prevaccination Checklist for COVID-19 Vaccines  1.  Are you feeling sick today? no  2.  Have you ever received a dose of a COVID-19 vaccine?  no      If yes, which one? None   3.  Have you ever had an allergic reaction: (This would include a severe reaction [ e.g., anaphylaxis] that required treatment with epinephrine or EpiPen or that caused you to go to the hospital.  It would also include an allergic reaction that occurred within 4 hours that caused hives, swelling, or respiratory distress, including wheezing.) A.  A previous dose of COVID-19 vaccine. no  B.  A vaccine or injectable therapy that contains multiple components, one of which is a COVID-19 vaccine component, but it is not known which component elicited the immediate reaction. no  C.  Are you allergic to polyethylene glycol? no   4.  Have you ever had an allergic reaction to another vaccine (other than COVID-19 vaccine) or an injectable medication? (This would include a severe reaction [ e.g., anaphylaxis] that required treatment with epinephrine or EpiPen or that caused you to go to the hospital.  It would also include an allergic reaction that occurred within 4 hours that caused hives, swelling, or respiratory distress, including wheezing.)  no   5.  Have you ever had a severe allergic reaction (e.g., anaphylaxis) to something other than a component of the COVID-19 vaccine, or any vaccine or injectable medication?  This would include food, pet, venom, environmental, or oral medication allergies.  no   6.  Have you received any vaccine in the Miranda 14 days? no   7.  Have you ever had a positive test for COVID-19 or has a doctor ever told you that you had COVID-19?  no   8.  Have you received passive antibody therapy (monoclonal antibodies or convalescent  serum) as a treatment for COVID-19? no   9.  Do you have a weakened immune system caused by something such as HIV infection or cancer or do you take immunosuppressive drugs or therapies?  no   10.  Do you have a bleeding disorder or are you taking a blood thinner? yes   11.  Are you pregnant or breast-feeding? no   12.  Do you have dermal fillers? no   __________________   This patient is a 64 y.o. male that meets the FDA criteria to receive homebound vaccination. Patient or parent/caregiver understands they have the option to accept or refuse homebound vaccination.  Patient passed the pre-screening checklist and would like to proceed with homebound vaccination.  Based on questionnaire above, I recommend the patient be observed for 30 minutes.  There are an estimated #1 of other household members/caregivers who are also interested in receiving the vaccine.      I will send the patient's information to our scheduling team who will reach out to schedule the patient and potential caregiver/family members for homebound vaccination.    Ivonne Andrew 03/23/2020 3:21 PM

## 2020-04-19 ENCOUNTER — Ambulatory Visit: Payer: Medicaid Other | Attending: Critical Care Medicine

## 2020-04-19 DIAGNOSIS — Z23 Encounter for immunization: Secondary | ICD-10-CM

## 2020-04-19 NOTE — Progress Notes (Signed)
   Covid-19 Vaccination Clinic  Name:  MATIN MATTIOLI    MRN: [id number] DOB: [birth year]  04/19/2020  Mr. Monette was observed post Covid-19 immunization for 15 minutes without incident. He was provided with Vaccine Information Sheet and instruction to access the V-Safe system.   Mr. Cosma was instructed to call 911 with any severe reactions post vaccine: Marland Kitchen Difficulty breathing  . Swelling of face and throat  . A fast heartbeat  . A bad rash all over body  . Dizziness and weakness   Immunizations Administered    Name Date Dose VIS Date Route   Moderna COVID-19 Vaccine 04/19/2020  3:00 PM 0.5 mL 10/2019 Intramuscular   Manufacturer: Moderna   Lot: [id number]   NDC: 68257-493-55

## 2020-05-17 ENCOUNTER — Ambulatory Visit: Payer: Medicaid Other

## 2020-05-17 ENCOUNTER — Ambulatory Visit: Payer: Medicaid Other | Attending: Internal Medicine

## 2020-05-17 DIAGNOSIS — Z23 Encounter for immunization: Secondary | ICD-10-CM

## 2020-05-17 NOTE — Progress Notes (Signed)
   Covid-19 Vaccination Clinic  Name:  [name]    MRN: [id number] DOB: 20-Mar-1956  05/17/2020  [name] was observed post Covid-19 immunization for 15 minutes without incident. He was provided with Vaccine Information Sheet and instruction to access the V-Safe system.   [name] was instructed to call 911 with any severe reactions post vaccine: Marland Kitchen Difficulty breathing  . Swelling of face and throat  . A fast heartbeat  . A bad rash all over body  . Dizziness and weakness   Immunizations Administered    Name Date Dose VIS Date Route   Moderna COVID-19 Vaccine 05/17/2020 12:30 PM 0.5 mL 10/2019 Intramuscular   Manufacturer: Gala Murdoch   Lot: [id number]   NDC: 79150-569-79

## 2020-07-12 ENCOUNTER — Other Ambulatory Visit: Payer: Self-pay | Admitting: Primary Care

## 2020-07-12 DIAGNOSIS — I10 Essential (primary) hypertension: Secondary | ICD-10-CM

## 2020-09-04 ENCOUNTER — Telehealth (INDEPENDENT_AMBULATORY_CARE_PROVIDER_SITE_OTHER): Payer: Medicaid Other | Admitting: Primary Care

## 2020-09-04 ENCOUNTER — Encounter: Payer: Self-pay | Admitting: Primary Care

## 2020-09-04 ENCOUNTER — Other Ambulatory Visit: Payer: Self-pay

## 2020-09-04 DIAGNOSIS — R6883 Chills (without fever): Secondary | ICD-10-CM

## 2020-09-04 DIAGNOSIS — Z20822 Contact with and (suspected) exposure to covid-19: Secondary | ICD-10-CM | POA: Diagnosis not present

## 2020-09-04 DIAGNOSIS — J029 Acute pharyngitis, unspecified: Secondary | ICD-10-CM

## 2020-09-04 NOTE — Assessment & Plan Note (Signed)
Symptoms of diarrhea, cough, sore throat, chills are suspicious for Covid-19 infection, especially since he was with someone who had similar symptoms one week prior.  He has been vaccinated which is reassuring. Also his diarrhea seems to have improved. Recommended that he come for testing, he agrees. Will get him set up.

## 2020-09-04 NOTE — Patient Instructions (Signed)
We will call you for Covid-19 testing.  It was a pleasure to see you today!

## 2020-09-04 NOTE — Progress Notes (Signed)
Subjective:    Patient ID: [name], male    DOB: [birth year], 64 y.o.   MRN: [id number]  HPI     [name] - 64 y.o. male  MRN [id number]  Date of Birth: Jun 03, 1956  PCP: Doreene Nest, NP  This service was provided via telemedicine. Phone Visit performed on 09/04/2020    Rationale for phone visit along with limitations reviewed. I discussed the limitations, risks, security and privacy concerns of performing a phone visit and the availability of in person appointments. I also discussed with the patient that there may be a patient responsible charge related to this service. Patient consented to telephone encounter.    Location of patient: Home Location of provider: Office [place] [facility] Name of referring provider: N/A   Names of persons and role in encounter: Provider: Doreene Nest, NP  Patient: [name]  Other: N/A   Time on call: 8 min - 35 sec   Subjective: Chief Complaint  Patient presents with  . Diarrhea     HPI:  [name] is a 64 year old male with a history of hypertension, cardiomyopathy, atherosclerotic ulcer of aorta, alcohol abuse, tobacco abuse who presents today with a chief complaint of diarrhea.  His diarrhea began about 7-10 days ago, was having about 2-5 episodes of diarrhea daily, very runny/liquid/watery. During these episodes he was able to eat and drink well. Two evenings ago he at 6-8 pieces of pizza for dinner, doesn't remember having diarrhea after that meal. He's had no diarrhea over the Miranda 2-3 days.   He's had a bowel movement yesterday which was more formed. He denies bloody stools.  Yesterday he started feeling feverish. Also with sore throat that burns with breathing , mild cough. He was riding in a car with someone a week ago who recently was suspected to have Covid-19 a few days ago. He has not been tested for Covid-19. He was vaccinated in Summer 2021 against Covid-19.  Objective/Observations:  No cough  noted during visit.  No physical exam or vital signs collected unless specifically identified below.   There were no vitals taken for this visit.   Respiratory status: speaks in complete sentences without evident shortness of breath.   Assessment/Plan:  No problem-specific Assessment & Plan notes found for this encounter.   I discussed the assessment and treatment plan with the patient. The patient was provided an opportunity to ask questions and all were answered. The patient agreed with the plan and demonstrated an understanding of the instructions.  Lab Orders  No laboratory test(s) ordered today    No orders of the defined types were placed in this encounter.   The patient was advised to call back or seek an in-person evaluation if the symptoms worsen or if the condition fails to improve as anticipated.  Doreene Nest, NP    Review of Systems  Constitutional: Positive for chills and fatigue.  HENT: Positive for sore throat.   Respiratory: Positive for cough.   Gastrointestinal: Positive for diarrhea. Negative for abdominal pain, blood in stool, nausea and vomiting.       Past Medical History:  Diagnosis Date  . Hypertension      Social History   Socioeconomic History  . Marital status: Married    Spouse name: Not on file  . Number of children: Not on file  . Years of education: Not on file  . Highest education level: Not on file  Occupational  History  . Not on file  Tobacco Use  . Smoking status: Current Every Day Smoker    Packs/day: 1.00  . Smokeless tobacco: Never Used  Substance and Sexual Activity  . Alcohol use: Yes    Alcohol/week: 18.0 standard drinks    Types: 18 Cans of beer per week  . Drug use: Yes    Types: Marijuana  . Sexual activity: Not on file  Other Topics Concern  . Not on file  Social History Narrative  . Not on file   Social Determinants of Health   Financial Resource Strain:   . Difficulty of Paying Living Expenses:  Not on file  Food Insecurity:   . Worried About Programme researcher, broadcasting/film/video in the Miranda Year: Not on file  . Ran Out of Food in the Miranda Year: Not on file  Transportation Needs:   . Lack of Transportation (Medical): Not on file  . Lack of Transportation (Non-Medical): Not on file  Physical Activity:   . Days of Exercise per Week: Not on file  . Minutes of Exercise per Session: Not on file  Stress:   . Feeling of Stress : Not on file  Social Connections:   . Frequency of Communication with Friends and Family: Not on file  . Frequency of Social Gatherings with Friends and Family: Not on file  . Attends Religious Services: Not on file  . Active Member of Clubs or Organizations: Not on file  . Attends Banker Meetings: Not on file  . Marital Status: Not on file  Intimate Partner Violence:   . Fear of Current or Ex-Partner: Not on file  . Emotionally Abused: Not on file  . Physically Abused: Not on file  . Sexually Abused: Not on file    No past surgical history on file.  No family history on file.  Allergies  Allergen Reactions  . Codeine Phosphate     REACTION: unspecified    Current Outpatient Medications on File Prior to Visit  Medication Sig Dispense Refill  . amLODipine (NORVASC) 10 MG tablet TAKE ONE TABLET BY MOUTH EVERY DAY FOR BLOOD PRESSURE 90 tablet 0  . atorvastatin (LIPITOR) 20 MG tablet TAKE 1 TABLET BY MOUTH DAILY FOR CHOLESTEROL 90 tablet 3  . carvedilol (COREG) 6.25 MG tablet TAKE ONE TABLET BY MOUTH TWICE DAILY WITH A MEAL FOR BLOOD PRESSURE AND HEARTRATE 180 tablet 1   No current facility-administered medications on file prior to visit.    There were no vitals taken for this visit.   Objective:   Physical Exam Constitutional:      General: He is not in acute distress. Pulmonary:     Effort: Pulmonary effort is normal.     Comments: No cough during visit Neurological:     Mental Status: He is alert and oriented to person, place, and time.             Assessment & Plan:

## 2020-09-05 ENCOUNTER — Other Ambulatory Visit (INDEPENDENT_AMBULATORY_CARE_PROVIDER_SITE_OTHER): Payer: Medicaid Other

## 2020-09-05 DIAGNOSIS — R6883 Chills (without fever): Secondary | ICD-10-CM

## 2020-09-05 DIAGNOSIS — J029 Acute pharyngitis, unspecified: Secondary | ICD-10-CM

## 2020-09-06 ENCOUNTER — Other Ambulatory Visit: Payer: Self-pay | Admitting: Primary Care

## 2020-09-06 DIAGNOSIS — I10 Essential (primary) hypertension: Secondary | ICD-10-CM

## 2020-10-20 ENCOUNTER — Ambulatory Visit: Payer: Medicaid Other | Admitting: Primary Care

## 2020-10-27 ENCOUNTER — Other Ambulatory Visit: Payer: Self-pay

## 2020-10-27 ENCOUNTER — Encounter: Payer: Medicaid Other | Admitting: Primary Care

## 2020-10-27 ENCOUNTER — Telehealth: Payer: Self-pay

## 2020-10-27 NOTE — Telephone Encounter (Signed)
Noted. I would have been perfectly fine to see patient, but office protocol does not allow patients inside with fever and URI symptoms. He could not conduct a virtual or phone visit.  Can we check on patient next week?

## 2020-10-27 NOTE — Telephone Encounter (Signed)
Pt in clinic today with his wife for an OV but reported he had a fever for three days. His wife also thought she had an apt today but her apt was yesterday and she reported she had a cough and SOB. Pt's were asked to go to their car and wait for a call due to protocol for anyone with respiratory symptoms that enter the clinic to go to the car and change the visit to virtual . Pt then reported they did not have a car and someone dropped them off and they did not know their number and they did not have a phone. Per provider, someone needed to pt them up and take them to and UC to be assessed for their symptoms.  They went to sit out in the foyer. This nurse went out to assess the pts. Pt's wife reported she was having SOB and cough and pt was not in any distress and not having any difficulty talking in full sentences and there was no audible wheezing or physical signs of difficulty breathing. She reported she had more SOB when she was coughing. Pt was not coughing at all during her time here. Neither pt was in any distress. They both tried to remember friends phones numbers and gave one but when this nurse called that number it was not an active number. They then asked this nurse to call [employer] store up the street and ask for the manager, Lovett Calender, to see if her husband could come to get them. This nurse contacted Margerie and she said her husband could not pick them up because she had the car and they only had one vehicle. While still on the phone another staff member said someone had come to pick up both pts up. Thanked Margerie for her time.

## 2020-10-30 NOTE — Telephone Encounter (Signed)
Noted and glad to hear! 

## 2020-10-30 NOTE — Telephone Encounter (Signed)
Called patient is feeling better today. Did not have any concerns

## 2020-10-30 NOTE — Progress Notes (Signed)
Patient never evaluated.  °

## 2020-11-15 ENCOUNTER — Ambulatory Visit: Payer: Self-pay

## 2020-11-21 ENCOUNTER — Ambulatory Visit: Payer: Medicaid Other | Attending: Critical Care Medicine

## 2020-11-21 DIAGNOSIS — Z23 Encounter for immunization: Secondary | ICD-10-CM

## 2020-11-21 NOTE — Progress Notes (Signed)
   Covid-19 Vaccination Clinic  Name:  [name]    MRN: [id number] DOB: 1955-11-15  11/21/2020  Mr. [name] was observed post Covid-19 immunization for 15 minutes without incident. He was provided with Vaccine Information Sheet and instruction to access the V-Safe system.   Mr. [name] was instructed to call 911 with any severe reactions post vaccine: Marland Kitchen Difficulty breathing  . Swelling of face and throat  . A fast heartbeat  . A bad rash all over body  . Dizziness and weakness   Immunizations Administered    Name Date Dose VIS Date Route   Moderna Covid-19 Booster Vaccine 11/21/2020 11:40 AM 0.25 mL 08/30/2020 Intramuscular   Manufacturer: Moderna   Lot: [id number]   NDC: 96295-284-13

## 2020-12-20 ENCOUNTER — Other Ambulatory Visit: Payer: Self-pay | Admitting: Primary Care

## 2020-12-20 DIAGNOSIS — I10 Essential (primary) hypertension: Secondary | ICD-10-CM

## 2020-12-20 NOTE — Telephone Encounter (Signed)
He must be seen in the office for further refills. Let him know that he is due for follow up. Okay to send 30 day refill once he schedules. Tell him this will be the last refill until he is seen.

## 2020-12-22 NOTE — Telephone Encounter (Signed)
Pt called back. Irving Burton is scheduling him an appt. I have sent in his medication for 30 days.

## 2020-12-22 NOTE — Telephone Encounter (Signed)
Left message to return call to our office.  

## 2020-12-27 ENCOUNTER — Ambulatory Visit: Payer: Medicaid Other | Admitting: Primary Care

## 2021-01-02 ENCOUNTER — Encounter: Payer: Self-pay | Admitting: Primary Care

## 2021-01-02 ENCOUNTER — Ambulatory Visit: Payer: Medicaid Other | Admitting: Primary Care

## 2021-01-02 ENCOUNTER — Other Ambulatory Visit: Payer: Self-pay

## 2021-01-02 VITALS — BP 126/90 | HR 87 | Temp 97.2°F | Wt 150.5 lb

## 2021-01-02 DIAGNOSIS — I10 Essential (primary) hypertension: Secondary | ICD-10-CM | POA: Diagnosis not present

## 2021-01-02 DIAGNOSIS — L602 Onychogryphosis: Secondary | ICD-10-CM | POA: Insufficient documentation

## 2021-01-02 DIAGNOSIS — I719 Aortic aneurysm of unspecified site, without rupture: Secondary | ICD-10-CM | POA: Diagnosis not present

## 2021-01-02 DIAGNOSIS — I7410 Embolism and thrombosis of unspecified parts of aorta: Secondary | ICD-10-CM | POA: Diagnosis not present

## 2021-01-02 DIAGNOSIS — F1010 Alcohol abuse, uncomplicated: Secondary | ICD-10-CM

## 2021-01-02 LAB — COMPREHENSIVE METABOLIC PANEL
ALT: 13 U/L (ref 0–53)
AST: 16 U/L (ref 0–37)
Albumin: 4.2 g/dL (ref 3.5–5.2)
Alkaline Phosphatase: 94 U/L (ref 39–117)
BUN: 24 mg/dL — ABNORMAL HIGH (ref 6–23)
CO2: 31 mEq/L (ref 19–32)
Calcium: 9.3 mg/dL (ref 8.4–10.5)
Chloride: 101 mEq/L (ref 96–112)
Creatinine, Ser: 1.69 mg/dL — ABNORMAL HIGH (ref 0.40–1.50)
GFR: 42.83 mL/min — ABNORMAL LOW (ref >60.00)
Glucose, Bld: 104 mg/dL — ABNORMAL HIGH (ref 70–99)
Potassium: 3.4 mEq/L — ABNORMAL LOW (ref 3.5–5.1)
Sodium: 140 mEq/L (ref 135–145)
Total Bilirubin: 0.5 mg/dL (ref 0.2–1.2)
Total Protein: 7 g/dL (ref 6.0–8.3)

## 2021-01-02 LAB — LIPID PANEL
Cholesterol: 124 mg/dL (ref 0–200)
HDL: 36.6 mg/dL — ABNORMAL LOW (ref >39.00)
LDL Cholesterol: 66 mg/dL (ref 0–99)
NonHDL: 86.93
Total CHOL/HDL Ratio: 3
Triglycerides: 103 mg/dL (ref 0.0–149.0)
VLDL: 20.6 mg/dL (ref 0.0–40.0)

## 2021-01-02 LAB — CBC
HCT: 41 % (ref 39.0–52.0)
Hemoglobin: 13.7 g/dL (ref 13.0–17.0)
MCHC: 33.5 g/dL (ref 30.0–36.0)
MCV: 94.9 fl (ref 78.0–100.0)
Platelets: 172 10*3/uL (ref 150.0–400.0)
RBC: 4.32 Mil/uL (ref 4.22–5.81)
RDW: 16.2 % — ABNORMAL HIGH (ref 11.5–15.5)
WBC: 5.8 10*3/uL (ref 4.0–10.5)

## 2021-01-02 MED ORDER — AMLODIPINE BESYLATE 10 MG PO TABS: ORAL_TABLET | ORAL | 3 refills | Status: DC

## 2021-01-02 NOTE — Assessment & Plan Note (Signed)
Admits to 2-3 beers weekly. Labs pending.

## 2021-01-02 NOTE — Assessment & Plan Note (Signed)
Patient now agrees to see vascular services for history of aortic thrombus and penetrating atherosclerosis.  Strongly advised that he remain compliant to his atorvastatin and take every day.  Repeat lipid panel pending.

## 2021-01-02 NOTE — Assessment & Plan Note (Signed)
Diastolic reading above goal, he admits to missing doses of his amlodipine 10 mg and carvedilol 6.25 mg twice daily, several days weekly.  Strongly advised compliance. Referral placed to vascular services.

## 2021-01-02 NOTE — Assessment & Plan Note (Signed)
Patient now agrees to see vascular services for history of aortic thrombus and penetrating atherosclerosis.  Strongly advised that he remain compliant to his atorvastatin and take every day.  Repeat lipid panel pending.  Referral placed to vascular services.

## 2021-01-02 NOTE — Patient Instructions (Signed)
Stop by the lab prior to leaving today. I will notify you of your results once received.   You will be contacted regarding your referral to podiatry.  Please let us know if you have not been contacted within two weeks.   Take all of your medications everyday as prescribed.  It was a pleasure to see you today!

## 2021-01-02 NOTE — Assessment & Plan Note (Signed)
Elongated, thickened, yellow toenails.  Suspicion for ingrown toenail at site of pain today.  Referral placed to podiatry

## 2021-01-02 NOTE — Progress Notes (Signed)
Subjective:    Patient ID: [name], male    DOB: 05/17/1956, 65 y.o.   MRN: [id number]  HPI  This visit occurred during the SARS-CoV-2 public health emergency.  Safety protocols were in place, including screening questions prior to the visit, additional usage of staff PPE, and extensive cleaning of exam room while observing appropriate contact time as indicated for disinfecting solutions.   [name] is a 65 year old male with a significant medical history including hypertension, cardiomyopathy, aortic mural thrombus, atherosclerosis of aorta, alcohol abuse, tobacco abuse, pancreatitis who presents today to discuss hypertension and great toe pain.  1) Hypertension/Cardiomyopathy/atheroscelerosis: Currently managed on amlodipine 10 mg and carvediolol 6.25 mg BID, also atorvastatin 20 mg. He admits to forgetting taking his medications 3-4 days weekly.   He is drinking 2-3 beers weekly. 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  Referred to vascular services in 2019 and again in 2020, patient canceled both referral appointments.  Patient also referred to nephrology in June and July 2020 for chronic hyponatremia, tobacco abuse, hypertension.  He never presented for that appointment either.  BP Readings from Last 3 Encounters:  01/02/21 (!) 120/92  12/21/19 124/84  02/08/19 124/80    2) Toe Pain: Acute to the left great toe which began about 2-3 weeks ago. Mostly located to the medial side of the nail border. He denies redness or swelling, drainage.   Review of Systems  Respiratory: Negative for shortness of breath.   Cardiovascular: Negative for chest pain.  Skin: Negative for color change and wound.       Left great medial toenail pain  Neurological: Negative for dizziness and headaches.    Past Medical History:  Diagnosis Date  . Hypertension      Social History   Socioeconomic History  . Marital status: Married    Spouse name: Not on  file  . Number of children: Not on file  . Years of education: Not on file  . Highest education level: Not on file  Occupational History  . Not on file  Tobacco Use  . Smoking status: Current Every Day Smoker    Packs/day: 1.00  . Smokeless tobacco: Never Used  Substance and Sexual Activity  . Alcohol use: Yes    Alcohol/week: 18.0 standard drinks    Types: 18 Cans of beer per week  . Drug use: Yes    Types: Marijuana  . Sexual activity: Not on file  Other Topics Concern  . Not on file  Social History Narrative  . Not on file   Social Determinants of Health   Financial Resource Strain: Not on file  Food Insecurity: Not on file  Transportation Needs: Not on file  Physical Activity: Not on file  Stress: Not on file  Social Connections: Not on file  Intimate Partner Violence: Not on file    History reviewed. No pertinent surgical history.  History reviewed. No pertinent family history.  Allergies  Allergen Reactions  . Codeine Phosphate     REACTION: unspecified    Current Outpatient Medications on File Prior to Visit  Medication Sig Dispense Refill  . atorvastatin (LIPITOR) 20 MG tablet TAKE 1 TABLET BY MOUTH DAILY FOR CHOLESTEROL 90 tablet 3  . carvedilol (COREG) 6.25 MG tablet TAKE ONE TABLET BY MOUTH TWICE DAILY WITH A MEAL FOR BLOOD PRESSURE AND HEARTRATE 180 tablet 1   No current facility-administered medications on file prior to visit.    BP 126/90  Pulse 87   Temp (!) 97.2 F (36.2 C) (Temporal)   Wt 150 lb 8 oz (68.3 kg)   SpO2 99%   BMI 21.59 kg/m     Objective:   Physical Exam Constitutional:      Appearance: He is well-nourished.  Cardiovascular:     Rate and Rhythm: Normal rate and regular rhythm.  Pulmonary:     Effort: Pulmonary effort is normal.     Breath sounds: Normal breath sounds.  Musculoskeletal:     Cervical back: Neck supple.  Skin:    General: Skin is warm and dry.     Findings: No erythema.     Comments: Elongated,  thickened, yellow toenails to nearly each toe.  Left great toe medial nail border without erythema or swelling.  Minor tenderness along medial border.  Psychiatric:        Mood and Affect: Mood and affect normal.            Assessment & Plan:

## 2021-01-04 ENCOUNTER — Encounter: Payer: Self-pay | Admitting: Primary Care

## 2021-01-05 ENCOUNTER — Other Ambulatory Visit: Payer: Self-pay | Admitting: Primary Care

## 2021-01-05 DIAGNOSIS — N183 Chronic kidney disease, stage 3 unspecified: Secondary | ICD-10-CM

## 2021-01-23 ENCOUNTER — Encounter (INDEPENDENT_AMBULATORY_CARE_PROVIDER_SITE_OTHER): Payer: Medicaid Other | Admitting: Vascular Surgery

## 2021-01-23 ENCOUNTER — Other Ambulatory Visit (INDEPENDENT_AMBULATORY_CARE_PROVIDER_SITE_OTHER): Payer: Self-pay | Admitting: Vascular Surgery

## 2021-01-23 ENCOUNTER — Other Ambulatory Visit (INDEPENDENT_AMBULATORY_CARE_PROVIDER_SITE_OTHER): Payer: Medicaid Other

## 2021-01-23 DIAGNOSIS — I723 Aneurysm of iliac artery: Secondary | ICD-10-CM

## 2021-02-02 ENCOUNTER — Encounter (INDEPENDENT_AMBULATORY_CARE_PROVIDER_SITE_OTHER): Payer: Medicaid Other | Admitting: Vascular Surgery

## 2021-02-02 ENCOUNTER — Other Ambulatory Visit (INDEPENDENT_AMBULATORY_CARE_PROVIDER_SITE_OTHER): Payer: Medicaid Other

## 2021-03-06 ENCOUNTER — Other Ambulatory Visit: Payer: Self-pay

## 2021-03-06 ENCOUNTER — Other Ambulatory Visit (INDEPENDENT_AMBULATORY_CARE_PROVIDER_SITE_OTHER): Payer: Medicaid Other

## 2021-03-06 DIAGNOSIS — N183 Chronic kidney disease, stage 3 unspecified: Secondary | ICD-10-CM

## 2021-03-06 LAB — BASIC METABOLIC PANEL
BUN: 13 mg/dL (ref 6–23)
CO2: 28 mEq/L (ref 19–32)
Calcium: 9.5 mg/dL (ref 8.4–10.5)
Chloride: 98 mEq/L (ref 96–112)
Creatinine, Ser: 1.11 mg/dL (ref 0.40–1.50)
GFR: 70.85 mL/min (ref >60.00)
Glucose, Bld: 81 mg/dL (ref 70–99)
Potassium: 4.4 mEq/L (ref 3.5–5.1)
Sodium: 133 mEq/L — ABNORMAL LOW (ref 135–145)

## 2021-07-19 ENCOUNTER — Encounter (HOSPITAL_COMMUNITY): Payer: Self-pay

## 2021-07-19 ENCOUNTER — Emergency Department (HOSPITAL_COMMUNITY): Payer: Medicaid Other

## 2021-07-19 ENCOUNTER — Other Ambulatory Visit: Payer: Self-pay

## 2021-07-19 ENCOUNTER — Emergency Department (HOSPITAL_COMMUNITY)
Admission: EM | Admit: 2021-07-19 | Discharge: 2021-07-19 | Disposition: A | Discharge status: Home / Self Care | Payer: Medicaid Other | Attending: Emergency Medicine | Admitting: Emergency Medicine

## 2021-07-19 DIAGNOSIS — I774 Celiac artery compression syndrome: Secondary | ICD-10-CM | POA: Diagnosis not present

## 2021-07-19 DIAGNOSIS — Z23 Encounter for immunization: Secondary | ICD-10-CM | POA: Insufficient documentation

## 2021-07-19 DIAGNOSIS — J9811 Atelectasis: Secondary | ICD-10-CM | POA: Diagnosis not present

## 2021-07-19 DIAGNOSIS — R059 Cough, unspecified: Secondary | ICD-10-CM | POA: Diagnosis not present

## 2021-07-19 DIAGNOSIS — R402411 Glasgow coma scale score 13-15, in the field [EMT or ambulance]: Secondary | ICD-10-CM | POA: Diagnosis not present

## 2021-07-19 DIAGNOSIS — I6529 Occlusion and stenosis of unspecified carotid artery: Secondary | ICD-10-CM | POA: Diagnosis not present

## 2021-07-19 DIAGNOSIS — R404 Transient alteration of awareness: Secondary | ICD-10-CM | POA: Diagnosis not present

## 2021-07-19 DIAGNOSIS — R55 Syncope and collapse: Secondary | ICD-10-CM | POA: Diagnosis not present

## 2021-07-19 DIAGNOSIS — I723 Aneurysm of iliac artery: Secondary | ICD-10-CM | POA: Diagnosis not present

## 2021-07-19 DIAGNOSIS — F17210 Nicotine dependence, cigarettes, uncomplicated: Secondary | ICD-10-CM | POA: Insufficient documentation

## 2021-07-19 DIAGNOSIS — J449 Chronic obstructive pulmonary disease, unspecified: Secondary | ICD-10-CM | POA: Insufficient documentation

## 2021-07-19 DIAGNOSIS — S01111A Laceration without foreign body of right eyelid and periocular area, initial encounter: Secondary | ICD-10-CM | POA: Insufficient documentation

## 2021-07-19 DIAGNOSIS — M25512 Pain in left shoulder: Secondary | ICD-10-CM | POA: Diagnosis not present

## 2021-07-19 DIAGNOSIS — G9519 Other vascular myelopathies: Secondary | ICD-10-CM | POA: Diagnosis not present

## 2021-07-19 DIAGNOSIS — Y92009 Unspecified place in unspecified non-institutional (private) residence as the place of occurrence of the external cause: Secondary | ICD-10-CM | POA: Diagnosis not present

## 2021-07-19 DIAGNOSIS — M7989 Other specified soft tissue disorders: Secondary | ICD-10-CM | POA: Diagnosis not present

## 2021-07-19 DIAGNOSIS — R2681 Unsteadiness on feet: Secondary | ICD-10-CM | POA: Diagnosis not present

## 2021-07-19 DIAGNOSIS — I7789 Other specified disorders of arteries and arterioles: Secondary | ICD-10-CM | POA: Diagnosis not present

## 2021-07-19 DIAGNOSIS — N323 Diverticulum of bladder: Secondary | ICD-10-CM | POA: Diagnosis not present

## 2021-07-19 DIAGNOSIS — W01198A Fall on same level from slipping, tripping and stumbling with subsequent striking against other object, initial encounter: Secondary | ICD-10-CM | POA: Insufficient documentation

## 2021-07-19 DIAGNOSIS — I10 Essential (primary) hypertension: Secondary | ICD-10-CM | POA: Insufficient documentation

## 2021-07-19 DIAGNOSIS — S0003XA Contusion of scalp, initial encounter: Secondary | ICD-10-CM | POA: Diagnosis not present

## 2021-07-19 DIAGNOSIS — S0181XA Laceration without foreign body of other part of head, initial encounter: Secondary | ICD-10-CM

## 2021-07-19 DIAGNOSIS — S0511XA Contusion of eyeball and orbital tissues, right eye, initial encounter: Secondary | ICD-10-CM | POA: Diagnosis not present

## 2021-07-19 DIAGNOSIS — Z79899 Other long term (current) drug therapy: Secondary | ICD-10-CM | POA: Insufficient documentation

## 2021-07-19 DIAGNOSIS — M4802 Spinal stenosis, cervical region: Secondary | ICD-10-CM | POA: Diagnosis not present

## 2021-07-19 DIAGNOSIS — M2578 Osteophyte, vertebrae: Secondary | ICD-10-CM | POA: Diagnosis not present

## 2021-07-19 DIAGNOSIS — R58 Hemorrhage, not elsewhere classified: Secondary | ICD-10-CM | POA: Diagnosis not present

## 2021-07-19 DIAGNOSIS — M47812 Spondylosis without myelopathy or radiculopathy, cervical region: Secondary | ICD-10-CM | POA: Diagnosis not present

## 2021-07-19 DIAGNOSIS — I712 Thoracic aortic aneurysm, without rupture: Secondary | ICD-10-CM | POA: Insufficient documentation

## 2021-07-19 DIAGNOSIS — S199XXA Unspecified injury of neck, initial encounter: Secondary | ICD-10-CM | POA: Diagnosis not present

## 2021-07-19 DIAGNOSIS — S0993XA Unspecified injury of face, initial encounter: Secondary | ICD-10-CM | POA: Diagnosis present

## 2021-07-19 DIAGNOSIS — I513 Intracardiac thrombosis, not elsewhere classified: Secondary | ICD-10-CM | POA: Diagnosis not present

## 2021-07-19 LAB — CBC WITH DIFFERENTIAL/PLATELET
Abs Immature Granulocytes: 0.04 10*3/uL (ref 0.00–0.07)
Basophils Absolute: 0.1 10*3/uL (ref 0.0–0.1)
Basophils Relative: 1 %
Eosinophils Absolute: 0.4 10*3/uL (ref 0.0–0.5)
Eosinophils Relative: 4 %
HCT: 41.8 % (ref 39.0–52.0)
Hemoglobin: 14.6 g/dL (ref 13.0–17.0)
Immature Granulocytes: 0 %
Lymphocytes Relative: 17 %
Lymphs Abs: 1.6 10*3/uL (ref 0.7–4.0)
MCH: 33.1 pg (ref 26.0–34.0)
MCHC: 34.9 g/dL (ref 30.0–36.0)
MCV: 94.8 fL (ref 80.0–100.0)
Monocytes Absolute: 0.5 10*3/uL (ref 0.1–1.0)
Monocytes Relative: 6 %
Neutro Abs: 6.4 10*3/uL (ref 1.7–7.7)
Neutrophils Relative %: 72 %
Platelets: 183 10*3/uL (ref 150–400)
RBC: 4.41 MIL/uL (ref 4.22–5.81)
RDW: 14.5 % (ref 11.5–15.5)
WBC: 9 10*3/uL (ref 4.0–10.5)
nRBC: 0 % (ref 0.0–0.2)

## 2021-07-19 LAB — COMPREHENSIVE METABOLIC PANEL
ALT: 20 U/L (ref 0–44)
AST: 31 U/L (ref 15–41)
Albumin: 4 g/dL (ref 3.5–5.0)
Alkaline Phosphatase: 69 U/L (ref 38–126)
Anion gap: 13 (ref 5–15)
BUN: 14 mg/dL (ref 8–23)
CO2: 21 mmol/L — ABNORMAL LOW (ref 22–32)
Calcium: 9.1 mg/dL (ref 8.9–10.3)
Chloride: 94 mmol/L — ABNORMAL LOW (ref 98–111)
Creatinine, Ser: 1.02 mg/dL (ref 0.61–1.24)
GFR, Estimated: >60 mL/min (ref >60)
Glucose, Bld: 104 mg/dL — ABNORMAL HIGH (ref 70–99)
Potassium: 4.3 mmol/L (ref 3.5–5.1)
Sodium: 128 mmol/L — ABNORMAL LOW (ref 135–145)
Total Bilirubin: 0.8 mg/dL (ref 0.3–1.2)
Total Protein: 6.8 g/dL (ref 6.5–8.1)

## 2021-07-19 LAB — TROPONIN I (HIGH SENSITIVITY)
Troponin I (High Sensitivity): 25 ng/L — ABNORMAL HIGH (ref <18)
Troponin I (High Sensitivity): 24 ng/L — ABNORMAL HIGH (ref <18)

## 2021-07-19 LAB — MAGNESIUM: Magnesium: 1.8 mg/dL (ref 1.7–2.4)

## 2021-07-19 MED ORDER — IOHEXOL 350 MG/ML SOLN
100.0000 mL | Freq: Once | INTRAVENOUS | Status: AC | PRN
  Administered 2021-07-19: 100 mL via INTRAVENOUS

## 2021-07-19 MED ORDER — TETANUS-DIPHTH-ACELL PERTUSSIS 5-2.5-18.5 LF-MCG/0.5 IM SUSY
0.5000 mL | PREFILLED_SYRINGE | Freq: Once | INTRAMUSCULAR | Status: AC
  Administered 2021-07-19: 0.5 mL via INTRAMUSCULAR
  Filled 2021-07-19: qty 0.5

## 2021-07-19 MED ORDER — SODIUM CHLORIDE 0.9 % IV BOLUS
500.0000 mL | Freq: Once | INTRAVENOUS | Status: AC
  Administered 2021-07-19: 500 mL via INTRAVENOUS

## 2021-07-19 MED ORDER — LIDOCAINE-EPINEPHRINE 1 %-1:100000 IJ SOLN
10.0000 mL | Freq: Once | INTRAMUSCULAR | Status: AC
  Administered 2021-07-19: 10 mL
  Filled 2021-07-19: qty 1

## 2021-07-19 NOTE — ED Notes (Signed)
Patient discharge instructions reviewed with the patient. The patient verbalized understanding of instructions. Patient discharged. 

## 2021-07-19 NOTE — ED Triage Notes (Signed)
Pt brought in by ems s/p fall at home with laceration above right eye and right eyebrow, approx 3 cm laceration with what was described as arterial bleeding from wound. Pressure dressing in place, bleeding controlled. Pt admits to drinking heavily this evening.

## 2021-07-19 NOTE — ED Provider Notes (Signed)
[facility] EMERGENCY DEPARTMENT Provider Note   CSN: [id number] Arrival date & time: 07/19/21  6503     History Chief Complaint  Patient presents with   [name] is a 65 y.o. male.  The history is provided by the patient and the EMS personnel.  Fall [name] is a 65 y.o. male who presents to the Emergency Department complaining of fall. He presents the emergency department for evaluation of injuries following a fall at home. History is provided by EMS and the patient. Patient was drinking alcohol this evening and tripped and fell, striking his face. He had significant bleeding at home, which EMS reports as arterial. Patient reports cough for the last couple of weeks but otherwise no recent illnesses or complaints. He does report that his left arm has been feeling weaker over the last few hours.     Past Medical History:  Diagnosis Date   Hypertension     Patient Active Problem List   Diagnosis Date Noted   Overgrown toenails 01/02/2021   Suspected COVID-19 virus infection 09/04/2020   Nipple tenderness 12/21/2019   Hypertensive urgency 08/27/2018   Aortic mural thrombus (HCC) 08/25/2018   Intramural aortic hematoma (HCC) 08/25/2018   Penetrating atherosclerotic ulcer of aorta (HCC)    LOW BACK PAIN, CHRONIC 11/30/2010   DEPRESSION 03/18/2008   PANCREATITIS, CHRONIC 03/18/2008   RESTLESS LEG SYNDROME 06/18/2007   Alcohol abuse 01/30/2007   TOBACCO ABUSE 01/30/2007   Essential hypertension 01/30/2007   COPD 01/30/2007   CARDIOMYOPATHY 05/30/1998    History reviewed. No pertinent surgical history.     History reviewed. No pertinent family history.  Social History   Tobacco Use   Smoking status: Every Day    Packs/day: 1.00    Types: Cigarettes   Smokeless tobacco: Never  Substance Use Topics   Alcohol use: Yes    Alcohol/week: 18.0 standard drinks    Types: 18 Cans of beer per week   Drug use: Yes    Types: Marijuana     Home Medications Prior to Admission medications   Medication Sig Start Date End Date Taking? Authorizing Provider  amLODipine (NORVASC) 10 MG tablet TAKE ONE TABLET BY MOUTH EVERY DAY FOR BLOOD PRESSURE 01/02/21   Doreene Nest, NP  atorvastatin (LIPITOR) 20 MG tablet TAKE 1 TABLET BY MOUTH DAILY FOR CHOLESTEROL 03/16/20   Doreene Nest, NP  carvedilol (COREG) 6.25 MG tablet TAKE ONE TABLET BY MOUTH TWICE DAILY WITH A MEAL FOR BLOOD PRESSURE AND HEARTRATE 09/07/20   Doreene Nest, NP    Allergies    Codeine phosphate  Review of Systems   Review of Systems  All other systems reviewed and are negative.  Physical Exam Updated Vital Signs BP (!) 164/103 (BP Location: Right Arm)   Pulse 88   Temp 98.4 F (36.9 C) (Oral)   Resp 16   Ht 5\' 11"  (1.803 m)   Wt 65.8 kg   SpO2 97%   BMI 20.22 kg/m   Physical Exam Vitals and nursing note reviewed.  Constitutional:      Appearance: He is well-developed.  HENT:     Head: Normocephalic.     Comments: Pupils equal round and reactive, EOM I. There is a complex stellate laceration superior to the right eyebrow.  There is right periorbital ecchymosis with mild local swelling.  Cardiovascular:     Rate and Rhythm: Normal rate and regular rhythm.  Heart sounds: No murmur heard. Pulmonary:     Effort: Pulmonary effort is normal. No respiratory distress.     Breath sounds: Normal breath sounds.  Abdominal:     Palpations: Abdomen is soft.     Tenderness: There is no abdominal tenderness. There is no guarding or rebound.  Musculoskeletal:     Comments: Mild tenderness to palpation of left shoulder.    Skin:    General: Skin is warm and dry.  Neurological:     Mental Status: He is alert and oriented to person, place, and time.     Comments: 5/5 grip strength in bilateral hands.  Difficulty raising left arm above chest.  5/5 strength in BLE.  Sensation to light touch intact in all four extremities.  Visual fields grossly  intact.  Sensation to light touch intact in all four extremities.   Psychiatric:        Behavior: Behavior normal.    ED Results / Procedures / Treatments   Labs (all labs ordered are listed, but only abnormal results are displayed) Labs Reviewed  COMPREHENSIVE METABOLIC PANEL - Abnormal; Notable for the following components:      Result Value   Sodium 128 (*)    Chloride 94 (*)    CO2 21 (*)    Glucose, Bld 104 (*)    All other components within normal limits  CBC WITH DIFFERENTIAL/PLATELET  MAGNESIUM  TROPONIN I (HIGH SENSITIVITY)    EKG EKG Interpretation  Date/Time:  Thursday July 19 2021 06:54:29 EDT Ventricular Rate:  90 PR Interval:  176 QRS Duration: 90 QT Interval:  390 QTC Calculation: 477 R Axis:   64 Text Interpretation: Normal sinus rhythm Right atrial enlargement Left ventricular hypertrophy with repolarization abnormality ( Sokolow-Lyon ) Cannot rule out Septal infarct , age undetermined Abnormal ECG Confirmed by Tilden Fossa [phone]) on 07/19/2021 7:17:54 AM  Radiology DG Chest 2 View  Result Date: 07/19/2021 CLINICAL DATA:  Larey Seat. Cough. EXAM: CHEST - 2 VIEW COMPARISON:  Chest x-ray 12/14/2018 FINDINGS: Interval progressive enlargement of the thoracic aorta. The heart is normal in size. Normal pulmonary hila. The lungs are clear. No pleural effusions. The bony thorax is intact. Remote healed right distal clavicle fracture noted. IMPRESSION: 1. Progressive enlargement of the thoracic aorta. CT angiography of the chest may be helpful for further evaluation. 2. No acute pulmonary findings. Electronically Signed   By: Rudie Meyer M.D.   On: 07/19/2021 06:53   CT Head Wo Contrast  Result Date: 07/19/2021 CLINICAL DATA:  65 year old male status post fall at home. Right eye laceration and swelling. EXAM: CT HEAD WITHOUT CONTRAST TECHNIQUE: Contiguous axial images were obtained from the base of the skull through the vertex without intravenous contrast.  COMPARISON:  Brain MRI 12/09/2006.  Head CT 12/14/2018. FINDINGS: Brain: Chronic but progressed and confluent bilateral cerebral white matter hypodensity. Multifocal bilateral deep gray nuclei lacunar infarcts are also increased since 2020. Brainstem and cerebellum appear relatively spared. No midline shift, ventriculomegaly, mass effect, evidence of mass lesion, intracranial hemorrhage or evidence of cortically based acute infarction. Vascular: Calcified atherosclerosis at the skull base. Skull: Calvarium intact.  Facial bones are reported separately. Sinuses/Orbits: Visualized paranasal sinuses and mastoids are stable and well aerated. Other: Right forehead scalp hematoma. Underlying frontal bones appear intact. Globes and orbits soft tissues remain within normal limits. There is right anterior face, premalar soft tissue swelling and stranding. See face CT reported separately. IMPRESSION: 1. Right scalp soft tissue injury without underlying skull fracture.  See also Face CT reported separately. 2. No acute intracranial abnormality. Advanced small vessel disease with progression since 2020. Electronically Signed   By: Odessa Fleming M.D.   On: 07/19/2021 07:15   CT Maxillofacial WO CM  Result Date: 07/19/2021 CLINICAL DATA:  65 year old male status post fall at home. Right eye laceration and swelling. EXAM: CT MAXILLOFACIAL WITHOUT CONTRAST TECHNIQUE: Multidetector CT imaging of the maxillofacial structures was performed. Multiplanar CT image reconstructions were also generated. COMPARISON:  Head CT today.  Brain MRI 12/09/2006. FINDINGS: Osseous: Mandible intact and normally located. Widespread carious dentition. Large but circumscribed 2.1 cm lucency of the left maxillary alveolus and hard palate is associated with regional poor dentition, with numerous smaller dental periapical lucencies elsewhere, and was present on the 2008 MRI - about 12 mm at that time. No acute maxillary fracture. No pterygoid or zygoma  fracture. Chronic appearing left nasal bone/nasal process left maxilla fracture. No acute nasal bone fracture identified. Central skull base appears intact. Orbits: No orbital wall fracture. Globes and intraorbital soft tissues appear normal. There is mild forehead and inferior right periorbital soft tissue swelling and stranding compatible with superficial hematoma/contusion. Sinuses: Clear throughout.  Chronic leftward nasal septal deviation. Soft tissues: Calcified carotid atherosclerosis, otherwise negative visible noncontrast deep soft tissue spaces of the face. Limited intracranial: Stable to that reported separately today. Other findings: Abnormally enlarged contour of the aortic arch on the scout view. See also chest radiographs 0556 hours today. IMPRESSION: 1. Right periorbital and forehead superficial hematoma/contusion. No underlying acute facial fracture. 2. Poor dentition, with associated extensive dental periapical lucency - including a chronic but progressed and now 2 cm lucency of the left maxillary alveolus, hard palate. 3. Abnormally enlarged contour of the aortic arch on the scout view. See also Chest Radiographs 0556 hours today. Electronically Signed   By: Odessa Fleming M.D.   On: 07/19/2021 07:21    Procedures .Marland KitchenLaceration Repair  Date/Time: 07/19/2021 7:20 AM Performed by: Tilden Fossa, MD Authorized by: Tilden Fossa, MD   Consent:    Consent obtained:  Verbal   Risks discussed:  Infection, pain and poor cosmetic result Universal protocol:    Patient identity confirmed:  Verbally with patient Anesthesia:    Anesthesia method:  Local infiltration   Local anesthetic:  Lidocaine 2% WITH epi Laceration details:    Location:  Face   Length (cm):  4 Pre-procedure details:    Preparation:  Patient was prepped and draped in usual sterile fashion Exploration:    Hemostasis achieved with:  Direct pressure   Contaminated: no   Treatment:    Area cleansed with:  Chlorhexidine    Amount of cleaning:  Standard   Irrigation solution:  Sterile saline   Debridement:  None Skin repair:    Repair method:  Sutures   Suture size:  4-0   Suture material:  Prolene   Suture technique:  Simple interrupted   Number of sutures:  4 Approximation:    Approximation:  Close Repair type:    Repair type:  Intermediate Post-procedure details:    Procedure completion:  Tolerated well, no immediate complications   Medications Ordered in ED Medications  lidocaine-EPINEPHrine (XYLOCAINE W/EPI) 1 %-1:100000 (with pres) injection 10 mL (10 mLs Infiltration Given 07/19/21 0651)  Tdap (BOOSTRIX) injection 0.5 mL (0.5 mLs Intramuscular Given 07/19/21 0652)  sodium chloride 0.9 % bolus 500 mL (500 mLs Intravenous New Bag/Given 07/19/21 8469)    ED Course  I have reviewed the triage vital signs  and the nursing notes.  Pertinent labs & imaging results that were available during my care of the patient were reviewed by me and considered in my medical decision making (see chart for details).    MDM Rules/Calculators/A&P                          patient here for evaluation of injuries following a fall, he is intoxicated on examination. He is a complex facial laceration that was repaired per note. He also complains of weakness to his left upper extremity, this appears to be predominantly to the proximal left upper extremity. Patient care transferred pending imaging.   Final Clinical Impression(s) / ED Diagnoses Final diagnoses:  None    Rx / DC Orders ED Discharge Orders     None        Tilden Fossaees, Irisa Grimsley, MD 07/19/21 [phone]

## 2021-07-19 NOTE — ED Provider Notes (Signed)
Care of patient assumed from Dr. Madilyn Hook at 7:30 AM.  This patient got intoxicated with alcohol and had a fall.  He struck his face.  He has undergone laceration repair.  He does have some proximal left arm weakness.  MRI of C-spine was ordered following negative CT scan of cervical spine.  Initial trop was mildly elevated.  Repeat troponins have been ordered.  He is also undergoing a CT angiogram for evaluation of possible dissection.  Follow-up on results.  If all imaging is reassuring, he can be discharged home.   Physical Exam  BP (!) 166/109 (BP Location: Right Arm)   Pulse 94   Temp 98.5 F (36.9 C) (Oral)   Resp 16   Ht 5\' 11"  (1.803 m)   Wt 65.8 kg   SpO2 96%   BMI 20.22 kg/m   Physical Exam Constitutional:      General: He is sleeping.  HENT:     Head:     Comments: Repaired laceration overlying right eyebrow Pulmonary:     Effort: Pulmonary effort is normal.  Abdominal:     Palpations: Abdomen is soft.  Musculoskeletal:        General: No deformity.  Skin:    General: Skin is warm and dry.  Neurological:     General: No focal deficit present.     Mental Status: He is oriented to person, place, and time and easily aroused.  Psychiatric:        Mood and Affect: Mood normal.        Behavior: Behavior normal.    ED Course/Procedures     Procedures  MDM  Delta troponin showed no increase.  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  3 years ago, it measured 3-1/2 cm.  I spoke with CT surgeon on-call regarding this.  Recommendation is for follow-up imaging in 6 months.  CT surgeon stated that he would be happy to see the patient in clinic in the short-term.  MRI of cervical spine showed no fractures or malalignments.  Cervical collar was cleared.  At this time, patient was clinically sober.  His wife remained at bedside.  Patient was informed of imaging results.  Patient's wife stated that patient will follow-up with a primary care doctor as  well as CT surgeon.  They were informed that sutures will need to be removed in 1 week..  Patient was discharged in stable condition.       , MD 07/19/21 [phone]

## 2021-07-20 ENCOUNTER — Telehealth: Payer: Self-pay

## 2021-07-20 NOTE — Telephone Encounter (Signed)
Transition Care Management Unsuccessful Follow-up Telephone Call  Date of discharge and from where:  07/19/2021[facility]  Attempts:  1st Attempt  Reason for unsuccessful TCM follow-up call:  Missing or invalid number Not a working number.

## 2021-07-23 NOTE — Telephone Encounter (Signed)
Transition Care Management Unsuccessful Follow-up Telephone Call  Date of discharge and from where:  07/19/2021 from [facility]  Attempts:  2nd Attempt  Reason for unsuccessful TCM follow-up call:  Missing or invalid number

## 2021-07-24 ENCOUNTER — Telehealth: Payer: Self-pay | Admitting: *Deleted

## 2021-07-24 NOTE — Telephone Encounter (Signed)
Transition Care Management Unsuccessful Follow-up Telephone Call  Date of discharge and from where:  07/19/2021 from [facility]  Attempts:  3rd Attempt  Reason for unsuccessful TCM follow-up call:  Unable to reach patient    

## 2021-08-01 ENCOUNTER — Ambulatory Visit: Payer: Medicaid Other | Admitting: Primary Care

## 2021-08-06 ENCOUNTER — Encounter: Payer: Medicaid Other | Admitting: Thoracic Surgery (Cardiothoracic Vascular Surgery)

## 2021-08-09 ENCOUNTER — Telehealth: Payer: Self-pay | Admitting: *Deleted

## 2021-08-09 ENCOUNTER — Institutional Professional Consult (permissible substitution): Payer: Medicaid Other | Admitting: Thoracic Surgery (Cardiothoracic Vascular Surgery)

## 2021-08-09 NOTE — Telephone Encounter (Signed)
per [facility], try one more time to reach this patient- phone number not working, have tried numerous time, will mail another appt letter

## 2021-08-15 ENCOUNTER — Encounter: Payer: Self-pay | Admitting: Primary Care

## 2021-08-15 ENCOUNTER — Ambulatory Visit: Payer: Medicaid Other | Admitting: Primary Care

## 2021-08-15 ENCOUNTER — Other Ambulatory Visit: Payer: Self-pay

## 2021-08-15 DIAGNOSIS — I10 Essential (primary) hypertension: Secondary | ICD-10-CM | POA: Diagnosis not present

## 2021-08-15 DIAGNOSIS — I712 Thoracic aortic aneurysm, without rupture, unspecified: Secondary | ICD-10-CM | POA: Insufficient documentation

## 2021-08-15 DIAGNOSIS — I7410 Embolism and thrombosis of unspecified parts of aorta: Secondary | ICD-10-CM | POA: Diagnosis not present

## 2021-08-15 DIAGNOSIS — Z4802 Encounter for removal of sutures: Secondary | ICD-10-CM

## 2021-08-15 DIAGNOSIS — I719 Aortic aneurysm of unspecified site, without rupture: Secondary | ICD-10-CM

## 2021-08-15 DIAGNOSIS — I7122 Aneurysm of the aortic arch, without rupture: Secondary | ICD-10-CM

## 2021-08-15 DIAGNOSIS — Z91199 Patient's noncompliance with other medical treatment and regimen due to unspecified reason: Secondary | ICD-10-CM | POA: Diagnosis not present

## 2021-08-15 DIAGNOSIS — F1010 Alcohol abuse, uncomplicated: Secondary | ICD-10-CM

## 2021-08-15 MED ORDER — ATORVASTATIN CALCIUM 20 MG PO TABS: ORAL_TABLET | ORAL | 0 refills | Status: DC

## 2021-08-15 NOTE — Assessment & Plan Note (Signed)
Reviewed CTA from emergency department visit in September 2022.  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  From chart review it looks like he has canceled 2 appointments with his cardiothoracic surgeon.  I printed out his upcoming appointment which is scheduled for October 18 at 4:15 PM.  I also provided him with their phone number and address.  He verbalized understanding.  Will refill a 30-day supply of his atorvastatin 20 mg.  He understands that he will not receive additional refills from me after this. 

## 2021-08-15 NOTE — Assessment & Plan Note (Addendum)
For laceration sustained 1 month ago.  Patient never followed up 1 week later as instructed.  4 sutures removed today, patient tolerated well.  Laceration has healed well.  No evidence of infection or complications.  Emergency department notes and imaging reviewed from September 2022

## 2021-08-15 NOTE — Assessment & Plan Note (Signed)
Off of amlodipine and carvedilol for 3 to 6 months. Blood pressure today stable.  Will defer treatment to cardiothoracic surgery as he is scheduled for October 18.  We discussed his upcoming appointment in detail and I printed out his appointment date, time, location, phone number.  He verbalized understanding.

## 2021-08-15 NOTE — Progress Notes (Signed)
Subjective:    Patient ID: [name], male    DOB: Aug 17, 1956, 65 y.o.   MRN: [id number]  HPI  [name] is a very pleasant 65 y.o. male with a history of thoracic aortic aneurysm, medical non compliance, hypertension, cardiomyopathy, alcohol dependence, tobacco abuse who presents today for medication refills and suture removal.  He has not been seen since February 2022. Has been referred several times to vascular services for which he has not complied.   Managed on atorvastatin 20 mg, amlodipine 10 mg, and carvedilol 6.25 mg BID. Has been out of medications for "6 months". Last refill of carvedilol and atorvastatin was for 6 month supply in October 2021.  He presented to [facility] on 07/19/21 via EMS for fall while at home. He was drinking alcohol, tripped, fell forward striking his face. He sustained a laceration above his right eye. Four sutures were placed. Tetanus vaccine was updated. He underwent MRI of C-spine which was negative for acute findings. He underwent CT angiogram chest/abdomen/pelvis for evaluation of thoracic aneurysm that showed increased size of aortic arch to 5 cm from 3.5 cm three years ago.  He was discharged home with recommendations for repeat CTA in 6 months, PCP follow up in 1 week for suture removal, CT surgery follow up.   He presents today (1 month later) for suture removal. He has an appointment scheduled for October 18th for cardiothoracic surgery, he was unaware of his appointment.   He endorses drinking one 40 oz bottle of beer nightly or two to three 12 oz cans of beer nightly. He denies consuming alcohol during the day.   BP Readings from Last 3 Encounters:  08/15/21 130/82  07/19/21 (!) 159/112  01/02/21 126/90      Review of Systems       Past Medical History:  Diagnosis Date   Hypertension     Social History   Socioeconomic History   Marital status: Married    Spouse name: Not on file   Number of children: Not on file   Years of  education: Not on file   Highest education level: Not on file  Occupational History   Not on file  Tobacco Use   Smoking status: Every Day    Packs/day: 1.00    Types: Cigarettes   Smokeless tobacco: Never  Substance and Sexual Activity   Alcohol use: Yes    Alcohol/week: 18.0 standard drinks    Types: 18 Cans of beer per week   Drug use: Yes    Types: Marijuana   Sexual activity: Not on file  Other Topics Concern   Not on file  Social History Narrative   Not on file   Social Determinants of Health   Financial Resource Strain: Not on file  Food Insecurity: Not on file  Transportation Needs: Not on file  Physical Activity: Not on file  Stress: Not on file  Social Connections: Not on file  Intimate Partner Violence: Not on file    No past surgical history on file.  No family history on file.  Allergies  Allergen Reactions   Codeine Phosphate     REACTION: unspecified    Current Outpatient Medications on File Prior to Visit  Medication Sig Dispense Refill   amLODipine (NORVASC) 10 MG tablet TAKE ONE TABLET BY MOUTH EVERY DAY FOR BLOOD PRESSURE (Patient not taking: Reported on 08/15/2021) 90 tablet 3   atorvastatin (LIPITOR) 20 MG tablet TAKE 1 TABLET BY MOUTH DAILY FOR CHOLESTEROL (  Patient not taking: Reported on 08/15/2021) 90 tablet 3   carvedilol (COREG) 6.25 MG tablet TAKE ONE TABLET BY MOUTH TWICE DAILY WITH A MEAL FOR BLOOD PRESSURE AND HEARTRATE (Patient not taking: Reported on 08/15/2021) 180 tablet 1   No current facility-administered medications on file prior to visit.    BP 130/82   Pulse 72   Temp 98.6 F (37 C) (Temporal)   Ht 5\' 11"  (1.803 m)   Wt 157 lb (71.2 kg)   SpO2 98%   BMI 21.90 kg/m  Objective:   Physical Exam Eyes:      Comments: 4 sutures intact to right upper eye above eyebrow.  No surrounding erythema or drainage.  Scar noted.  Cardiovascular:     Rate and Rhythm: Normal rate and regular rhythm.  Pulmonary:     Effort: Pulmonary  effort is normal.     Breath sounds: No wheezing.  Musculoskeletal:     Cervical back: Neck supple.  Skin:    General: Skin is warm and dry.  Neurological:     Mental Status: He is alert and oriented to person, place, and time.  Psychiatric:        Mood and Affect: Mood normal.          Assessment & Plan:      This visit occurred during the SARS-CoV-2 public health emergency.  Safety protocols were in place, including screening questions prior to the visit, additional usage of staff PPE, and extensive cleaning of exam room while observing appropriate contact time as indicated for disinfecting solutions.

## 2021-08-15 NOTE — Assessment & Plan Note (Signed)
Chronic and ongoing.  Strongly advised to stop drinking beer and alcohol

## 2021-08-15 NOTE — Patient Instructions (Signed)
Resume your atorvastatin 20 mg medication for cholesterol. Take this once daily.  Follow up with the cardiothoracic surgeon for your aneurysm on October 18th as dicussed. I printed out your appointment date and time.  Please call us with a working phone number so that we can connect you to appropriate medical care.  It was a pleasure to see you today!

## 2021-08-15 NOTE — Progress Notes (Signed)
Patient was provided with dismissal letter in office today. Time was provided for any questions. Patient did not have any questions for me at this time will review further with Kate during appointment.  

## 2021-08-15 NOTE — Assessment & Plan Note (Signed)
Reviewed CTA from emergency department visit in September 2022.  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  From chart review it looks like he has canceled 2 appointments with his cardiothoracic surgeon.  I printed out his upcoming appointment which is scheduled for October 18 at 4:15 PM.  I also provided him with their phone number and address.  He verbalized understanding.  Will refill a 30-day supply of his atorvastatin 20 mg.  He understands that he will not receive additional refills from me after this.

## 2021-08-15 NOTE — Assessment & Plan Note (Addendum)
Reviewed CTA from emergency department visit in September 2022.  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  From chart review it looks like he has canceled 2 appointments with his cardiothoracic surgeon.  I printed out his upcoming appointment which is scheduled for October 18 at 4:15 PM.  I also provided him with their phone number and address.  He verbalized understanding.  Will refill a 30-day supply of his atorvastatin 20 mg.  He understands that he will not receive additional refills from me after this.  Blood pressure stable today, has been off meds for numerous months.

## 2021-08-15 NOTE — Assessment & Plan Note (Signed)
Unfortunately this has been a recurrent issue for years, not only with myself (PCP) but with other providers as well.  He has been provided with numerous chances and notifications regarding the importance of medical follow-up over the last 6 years.  There is a long documentation trial of inability to contact patient, appointment cancellations, no-show appointments, failure to follow-up as recommended per hospitalists post discharge.  Our clinic has also attempted to provide him with transportation and connect he and his wife to social services.  We have contacted Adult Protective Services as well were unable to connect with patient or his wife.  Today we discussed that we will be dismissing the patient given the reasons mentioned above.  Dismissal letter was handed to him today.  He had ample time to ask questions, all of which were answered.  We offered to set him up with our community care nurse so that our nurse can help get he and his wife connected to the best care possible.  We asked for a working phone number for which neither he or his wife was unable to provide.  His wife notified us that she would call us back tomorrow with this working phone number so that we can ensure they are both cared for.  We will provide medical care for him for the next 30 days, after this he will need to seek care from his new PCP.  All prescriptions that were filled today were filled for 30-day supply.  He is aware and verbalized understanding.

## 2021-08-22 ENCOUNTER — Telehealth: Payer: Self-pay | Admitting: *Deleted

## 2021-08-22 NOTE — Telephone Encounter (Signed)
(  MAIL RETURNED--NO FORWARDING ADDRESS)

## 2021-08-28 ENCOUNTER — Encounter: Payer: Medicaid Other | Admitting: Thoracic Surgery (Cardiothoracic Vascular Surgery)

## 2022-01-07 ENCOUNTER — Encounter: Payer: Self-pay | Admitting: Primary Care

## 2022-01-18 DIAGNOSIS — R410 Disorientation, unspecified: Secondary | ICD-10-CM | POA: Diagnosis not present

## 2022-01-18 DIAGNOSIS — R0902 Hypoxemia: Secondary | ICD-10-CM | POA: Diagnosis not present

## 2022-01-18 DIAGNOSIS — R000 Tachycardia, unspecified: Secondary | ICD-10-CM | POA: Diagnosis not present

## 2022-01-18 DIAGNOSIS — I340 Nonrheumatic mitral (valve) insufficiency: Secondary | ICD-10-CM | POA: Diagnosis not present

## 2022-01-18 DIAGNOSIS — I7123 Aneurysm of the descending thoracic aorta, without rupture: Secondary | ICD-10-CM | POA: Diagnosis not present

## 2022-01-18 DIAGNOSIS — N179 Acute kidney failure, unspecified: Secondary | ICD-10-CM | POA: Diagnosis not present

## 2022-01-18 DIAGNOSIS — I160 Hypertensive urgency: Secondary | ICD-10-CM | POA: Diagnosis not present

## 2022-01-18 DIAGNOSIS — R778 Other specified abnormalities of plasma proteins: Secondary | ICD-10-CM | POA: Diagnosis not present

## 2022-01-18 DIAGNOSIS — R0689 Other abnormalities of breathing: Secondary | ICD-10-CM | POA: Diagnosis not present

## 2022-01-18 DIAGNOSIS — F17200 Nicotine dependence, unspecified, uncomplicated: Secondary | ICD-10-CM | POA: Diagnosis not present

## 2022-01-18 DIAGNOSIS — F29 Unspecified psychosis not due to a substance or known physiological condition: Secondary | ICD-10-CM | POA: Diagnosis not present

## 2022-01-19 DIAGNOSIS — I160 Hypertensive urgency: Secondary | ICD-10-CM | POA: Diagnosis not present

## 2022-01-19 DIAGNOSIS — R778 Other specified abnormalities of plasma proteins: Secondary | ICD-10-CM | POA: Diagnosis not present

## 2022-01-19 DIAGNOSIS — I7123 Aneurysm of the descending thoracic aorta, without rupture: Secondary | ICD-10-CM | POA: Diagnosis not present

## 2022-01-19 DIAGNOSIS — N179 Acute kidney failure, unspecified: Secondary | ICD-10-CM | POA: Diagnosis not present

## 2022-01-19 DIAGNOSIS — I639 Cerebral infarction, unspecified: Secondary | ICD-10-CM | POA: Diagnosis not present

## 2022-01-19 DIAGNOSIS — F17200 Nicotine dependence, unspecified, uncomplicated: Secondary | ICD-10-CM | POA: Diagnosis not present

## 2022-01-20 DIAGNOSIS — F17200 Nicotine dependence, unspecified, uncomplicated: Secondary | ICD-10-CM | POA: Diagnosis not present

## 2022-01-20 DIAGNOSIS — I160 Hypertensive urgency: Secondary | ICD-10-CM | POA: Diagnosis not present

## 2022-01-20 DIAGNOSIS — R778 Other specified abnormalities of plasma proteins: Secondary | ICD-10-CM | POA: Diagnosis not present

## 2022-01-20 DIAGNOSIS — I7123 Aneurysm of the descending thoracic aorta, without rupture: Secondary | ICD-10-CM | POA: Diagnosis not present

## 2022-01-20 DIAGNOSIS — N179 Acute kidney failure, unspecified: Secondary | ICD-10-CM | POA: Diagnosis not present

## 2022-01-21 DIAGNOSIS — I7123 Aneurysm of the descending thoracic aorta, without rupture: Secondary | ICD-10-CM | POA: Diagnosis not present

## 2022-01-21 DIAGNOSIS — I160 Hypertensive urgency: Secondary | ICD-10-CM | POA: Diagnosis not present

## 2022-01-21 DIAGNOSIS — N179 Acute kidney failure, unspecified: Secondary | ICD-10-CM | POA: Diagnosis not present

## 2022-01-21 DIAGNOSIS — F17200 Nicotine dependence, unspecified, uncomplicated: Secondary | ICD-10-CM | POA: Diagnosis not present

## 2022-01-21 DIAGNOSIS — R778 Other specified abnormalities of plasma proteins: Secondary | ICD-10-CM | POA: Diagnosis not present

## 2022-01-22 ENCOUNTER — Telehealth: Payer: Self-pay

## 2022-01-22 NOTE — Telephone Encounter (Signed)
Transition Care Management Unsuccessful Follow-up Telephone Call ? ?Date of discharge and from where:  01/21/2022 from [facility] ? ?Attempts:  1st Attempt ? ?Reason for unsuccessful TCM follow-up call:  Missing or invalid number ? ? ? ?

## 2022-01-24 NOTE — Telephone Encounter (Signed)
Transition Care Management Unsuccessful Follow-up Telephone Call ? ?Date of discharge and from where:  01/21/2022 from [facility] ? ?Attempts:  2nd Attempt ? ?Reason for unsuccessful TCM follow-up call:  Missing or invalid number ? ? ? ?

## 2022-01-25 NOTE — Telephone Encounter (Signed)
Transition Care Management Unsuccessful Follow-up Telephone Call ? ?Date of discharge and from where:  01/21/2022 from [facility] ? ?Attempts:  3rd Attempt ? ?Reason for unsuccessful TCM follow-up call:  Missing or invalid number ? ? ? ?

## 2022-02-06 DIAGNOSIS — E7800 Pure hypercholesterolemia, unspecified: Secondary | ICD-10-CM | POA: Diagnosis not present

## 2022-02-06 DIAGNOSIS — I7123 Aneurysm of the descending thoracic aorta, without rupture: Secondary | ICD-10-CM | POA: Diagnosis not present

## 2022-02-06 DIAGNOSIS — N189 Chronic kidney disease, unspecified: Secondary | ICD-10-CM | POA: Diagnosis not present

## 2022-02-06 DIAGNOSIS — I639 Cerebral infarction, unspecified: Secondary | ICD-10-CM | POA: Diagnosis not present

## 2022-02-06 DIAGNOSIS — E119 Type 2 diabetes mellitus without complications: Secondary | ICD-10-CM | POA: Diagnosis not present

## 2022-02-06 DIAGNOSIS — Z720 Tobacco use: Secondary | ICD-10-CM | POA: Diagnosis not present

## 2022-02-06 DIAGNOSIS — I10 Essential (primary) hypertension: Secondary | ICD-10-CM | POA: Diagnosis not present

## 2022-04-01 DIAGNOSIS — F17210 Nicotine dependence, cigarettes, uncomplicated: Secondary | ICD-10-CM | POA: Diagnosis not present

## 2022-04-01 DIAGNOSIS — I10 Essential (primary) hypertension: Secondary | ICD-10-CM | POA: Diagnosis not present

## 2022-04-01 DIAGNOSIS — I6381 Other cerebral infarction due to occlusion or stenosis of small artery: Secondary | ICD-10-CM | POA: Diagnosis not present

## 2022-04-01 DIAGNOSIS — R231 Pallor: Secondary | ICD-10-CM | POA: Diagnosis not present

## 2022-04-01 DIAGNOSIS — R9089 Other abnormal findings on diagnostic imaging of central nervous system: Secondary | ICD-10-CM | POA: Diagnosis not present

## 2022-04-01 DIAGNOSIS — R9431 Abnormal electrocardiogram [ECG] [EKG]: Secondary | ICD-10-CM | POA: Diagnosis not present

## 2022-04-01 DIAGNOSIS — R4182 Altered mental status, unspecified: Secondary | ICD-10-CM | POA: Diagnosis not present

## 2022-04-01 DIAGNOSIS — I771 Stricture of artery: Secondary | ICD-10-CM | POA: Diagnosis not present

## 2022-04-01 DIAGNOSIS — I6782 Cerebral ischemia: Secondary | ICD-10-CM | POA: Diagnosis not present

## 2022-04-01 DIAGNOSIS — R000 Tachycardia, unspecified: Secondary | ICD-10-CM | POA: Diagnosis not present

## 2022-04-01 DIAGNOSIS — N3000 Acute cystitis without hematuria: Secondary | ICD-10-CM | POA: Diagnosis not present

## 2022-04-02 DIAGNOSIS — R000 Tachycardia, unspecified: Secondary | ICD-10-CM | POA: Diagnosis not present

## 2022-04-02 DIAGNOSIS — R4182 Altered mental status, unspecified: Secondary | ICD-10-CM | POA: Diagnosis not present

## 2022-04-02 DIAGNOSIS — N3000 Acute cystitis without hematuria: Secondary | ICD-10-CM | POA: Diagnosis not present

## 2022-05-13 DIAGNOSIS — S50312A Abrasion of left elbow, initial encounter: Secondary | ICD-10-CM | POA: Diagnosis not present

## 2022-05-13 DIAGNOSIS — R61 Generalized hyperhidrosis: Secondary | ICD-10-CM | POA: Diagnosis not present

## 2022-05-13 DIAGNOSIS — Z888 Allergy status to other drugs, medicaments and biological substances status: Secondary | ICD-10-CM | POA: Diagnosis not present

## 2022-05-13 DIAGNOSIS — W19XXXA Unspecified fall, initial encounter: Secondary | ICD-10-CM | POA: Diagnosis not present

## 2022-05-13 DIAGNOSIS — W1839XA Other fall on same level, initial encounter: Secondary | ICD-10-CM | POA: Diagnosis not present

## 2022-05-13 DIAGNOSIS — R55 Syncope and collapse: Secondary | ICD-10-CM | POA: Diagnosis not present

## 2022-05-13 DIAGNOSIS — Z885 Allergy status to narcotic agent status: Secondary | ICD-10-CM | POA: Diagnosis not present

## 2022-05-13 DIAGNOSIS — F0150 Vascular dementia without behavioral disturbance: Secondary | ICD-10-CM | POA: Diagnosis not present

## 2022-05-13 DIAGNOSIS — I10 Essential (primary) hypertension: Secondary | ICD-10-CM | POA: Diagnosis not present

## 2022-05-13 DIAGNOSIS — F17210 Nicotine dependence, cigarettes, uncomplicated: Secondary | ICD-10-CM | POA: Diagnosis not present

## 2022-05-13 DIAGNOSIS — R58 Hemorrhage, not elsewhere classified: Secondary | ICD-10-CM | POA: Diagnosis not present

## 2022-05-13 DIAGNOSIS — R531 Weakness: Secondary | ICD-10-CM | POA: Diagnosis not present

## 2022-05-13 DIAGNOSIS — R42 Dizziness and giddiness: Secondary | ICD-10-CM | POA: Diagnosis not present

## 2022-05-13 DIAGNOSIS — E876 Hypokalemia: Secondary | ICD-10-CM | POA: Diagnosis not present

## 2022-06-20 DIAGNOSIS — F17210 Nicotine dependence, cigarettes, uncomplicated: Secondary | ICD-10-CM | POA: Diagnosis not present

## 2022-06-20 DIAGNOSIS — I10 Essential (primary) hypertension: Secondary | ICD-10-CM | POA: Diagnosis not present

## 2022-06-20 DIAGNOSIS — Z79899 Other long term (current) drug therapy: Secondary | ICD-10-CM | POA: Diagnosis not present

## 2022-06-20 DIAGNOSIS — I719 Aortic aneurysm of unspecified site, without rupture: Secondary | ICD-10-CM | POA: Diagnosis not present

## 2022-06-20 DIAGNOSIS — Z59819 Housing instability, housed unspecified: Secondary | ICD-10-CM | POA: Diagnosis not present

## 2022-06-20 DIAGNOSIS — Z7982 Long term (current) use of aspirin: Secondary | ICD-10-CM | POA: Diagnosis not present

## 2022-06-20 DIAGNOSIS — Z9189 Other specified personal risk factors, not elsewhere classified: Secondary | ICD-10-CM | POA: Diagnosis not present

## 2022-06-20 DIAGNOSIS — R7303 Prediabetes: Secondary | ICD-10-CM | POA: Diagnosis not present

## 2022-06-20 DIAGNOSIS — R509 Fever, unspecified: Secondary | ICD-10-CM | POA: Diagnosis not present

## 2022-06-20 DIAGNOSIS — Z885 Allergy status to narcotic agent status: Secondary | ICD-10-CM | POA: Diagnosis not present

## 2022-06-20 DIAGNOSIS — J449 Chronic obstructive pulmonary disease, unspecified: Secondary | ICD-10-CM | POA: Diagnosis not present

## 2022-06-20 DIAGNOSIS — Z8673 Personal history of transient ischemic attack (TIA), and cerebral infarction without residual deficits: Secondary | ICD-10-CM | POA: Diagnosis not present

## 2022-06-20 DIAGNOSIS — Z91148 Patient's other noncompliance with medication regimen for other reason: Secondary | ICD-10-CM | POA: Diagnosis not present

## 2022-06-20 DIAGNOSIS — R42 Dizziness and giddiness: Secondary | ICD-10-CM | POA: Diagnosis not present

## 2022-08-05 DIAGNOSIS — Z1389 Encounter for screening for other disorder: Secondary | ICD-10-CM | POA: Diagnosis not present

## 2022-08-05 DIAGNOSIS — Z681 Body mass index (BMI) 19 or less, adult: Secondary | ICD-10-CM | POA: Diagnosis not present

## 2022-08-05 DIAGNOSIS — Z1331 Encounter for screening for depression: Secondary | ICD-10-CM | POA: Diagnosis not present

## 2022-08-05 DIAGNOSIS — E7800 Pure hypercholesterolemia, unspecified: Secondary | ICD-10-CM | POA: Diagnosis not present

## 2022-08-05 DIAGNOSIS — Z1159 Encounter for screening for other viral diseases: Secondary | ICD-10-CM | POA: Diagnosis not present

## 2022-08-05 DIAGNOSIS — Z716 Tobacco abuse counseling: Secondary | ICD-10-CM | POA: Diagnosis not present

## 2022-08-05 DIAGNOSIS — I10 Essential (primary) hypertension: Secondary | ICD-10-CM | POA: Diagnosis not present

## 2022-08-05 DIAGNOSIS — Z114 Encounter for screening for human immunodeficiency virus [HIV]: Secondary | ICD-10-CM | POA: Diagnosis not present

## 2022-08-29 DIAGNOSIS — I7410 Embolism and thrombosis of unspecified parts of aorta: Secondary | ICD-10-CM | POA: Diagnosis not present

## 2022-08-29 DIAGNOSIS — E119 Type 2 diabetes mellitus without complications: Secondary | ICD-10-CM | POA: Diagnosis not present

## 2022-08-29 DIAGNOSIS — K860 Alcohol-induced chronic pancreatitis: Secondary | ICD-10-CM | POA: Diagnosis not present

## 2022-08-29 DIAGNOSIS — I429 Cardiomyopathy, unspecified: Secondary | ICD-10-CM | POA: Diagnosis not present

## 2022-08-29 DIAGNOSIS — Z20822 Contact with and (suspected) exposure to covid-19: Secondary | ICD-10-CM | POA: Diagnosis not present

## 2022-08-29 DIAGNOSIS — Z5900 Homelessness unspecified: Secondary | ICD-10-CM | POA: Diagnosis not present

## 2022-08-29 DIAGNOSIS — R509 Fever, unspecified: Secondary | ICD-10-CM | POA: Diagnosis not present

## 2022-08-29 DIAGNOSIS — I10 Essential (primary) hypertension: Secondary | ICD-10-CM | POA: Diagnosis not present

## 2022-08-29 DIAGNOSIS — F32A Depression, unspecified: Secondary | ICD-10-CM | POA: Diagnosis not present

## 2022-08-29 DIAGNOSIS — N2889 Other specified disorders of kidney and ureter: Secondary | ICD-10-CM | POA: Diagnosis not present

## 2022-08-29 DIAGNOSIS — Z91199 Patient's noncompliance with other medical treatment and regimen due to unspecified reason: Secondary | ICD-10-CM | POA: Diagnosis not present

## 2022-08-29 DIAGNOSIS — I712 Thoracic aortic aneurysm, without rupture, unspecified: Secondary | ICD-10-CM | POA: Diagnosis not present

## 2022-08-29 DIAGNOSIS — F29 Unspecified psychosis not due to a substance or known physiological condition: Secondary | ICD-10-CM | POA: Diagnosis not present

## 2022-08-29 DIAGNOSIS — K861 Other chronic pancreatitis: Secondary | ICD-10-CM | POA: Diagnosis not present

## 2022-08-29 DIAGNOSIS — E8809 Other disorders of plasma-protein metabolism, not elsewhere classified: Secondary | ICD-10-CM | POA: Diagnosis not present

## 2022-08-29 DIAGNOSIS — E440 Moderate protein-calorie malnutrition: Secondary | ICD-10-CM | POA: Diagnosis not present

## 2022-08-29 DIAGNOSIS — F17210 Nicotine dependence, cigarettes, uncomplicated: Secondary | ICD-10-CM | POA: Diagnosis not present

## 2022-08-29 DIAGNOSIS — I513 Intracardiac thrombosis, not elsewhere classified: Secondary | ICD-10-CM | POA: Diagnosis not present

## 2022-08-29 DIAGNOSIS — N390 Urinary tract infection, site not specified: Secondary | ICD-10-CM | POA: Diagnosis not present

## 2022-08-29 DIAGNOSIS — R739 Hyperglycemia, unspecified: Secondary | ICD-10-CM | POA: Diagnosis not present

## 2022-08-29 DIAGNOSIS — R4182 Altered mental status, unspecified: Secondary | ICD-10-CM | POA: Diagnosis not present

## 2022-08-29 DIAGNOSIS — R000 Tachycardia, unspecified: Secondary | ICD-10-CM | POA: Diagnosis not present

## 2022-08-29 DIAGNOSIS — E0865 Diabetes mellitus due to underlying condition with hyperglycemia: Secondary | ICD-10-CM | POA: Diagnosis not present

## 2022-08-29 DIAGNOSIS — Z5901 Sheltered homelessness: Secondary | ICD-10-CM | POA: Diagnosis not present

## 2022-08-29 DIAGNOSIS — R410 Disorientation, unspecified: Secondary | ICD-10-CM | POA: Diagnosis not present

## 2022-08-29 DIAGNOSIS — Z681 Body mass index (BMI) 19 or less, adult: Secondary | ICD-10-CM | POA: Diagnosis not present

## 2022-08-30 DIAGNOSIS — R739 Hyperglycemia, unspecified: Secondary | ICD-10-CM | POA: Diagnosis not present

## 2022-08-30 DIAGNOSIS — I10 Essential (primary) hypertension: Secondary | ICD-10-CM | POA: Diagnosis not present

## 2022-08-30 DIAGNOSIS — R4182 Altered mental status, unspecified: Secondary | ICD-10-CM | POA: Diagnosis not present

## 2022-08-30 DIAGNOSIS — N289 Disorder of kidney and ureter, unspecified: Secondary | ICD-10-CM | POA: Diagnosis not present

## 2022-08-30 DIAGNOSIS — I429 Cardiomyopathy, unspecified: Secondary | ICD-10-CM | POA: Diagnosis not present

## 2022-08-31 DIAGNOSIS — K860 Alcohol-induced chronic pancreatitis: Secondary | ICD-10-CM | POA: Diagnosis not present

## 2022-08-31 DIAGNOSIS — I10 Essential (primary) hypertension: Secondary | ICD-10-CM | POA: Diagnosis not present

## 2022-08-31 DIAGNOSIS — F32A Depression, unspecified: Secondary | ICD-10-CM | POA: Diagnosis not present

## 2022-08-31 DIAGNOSIS — E119 Type 2 diabetes mellitus without complications: Secondary | ICD-10-CM | POA: Diagnosis not present

## 2022-08-31 DIAGNOSIS — Z91199 Patient's noncompliance with other medical treatment and regimen due to unspecified reason: Secondary | ICD-10-CM | POA: Diagnosis not present

## 2022-08-31 DIAGNOSIS — R739 Hyperglycemia, unspecified: Secondary | ICD-10-CM | POA: Diagnosis not present

## 2022-08-31 DIAGNOSIS — R4182 Altered mental status, unspecified: Secondary | ICD-10-CM | POA: Diagnosis not present

## 2022-08-31 DIAGNOSIS — I712 Thoracic aortic aneurysm, without rupture, unspecified: Secondary | ICD-10-CM | POA: Diagnosis not present

## 2022-09-01 DIAGNOSIS — R4182 Altered mental status, unspecified: Secondary | ICD-10-CM | POA: Diagnosis not present

## 2022-09-01 DIAGNOSIS — E119 Type 2 diabetes mellitus without complications: Secondary | ICD-10-CM | POA: Diagnosis not present

## 2022-09-01 DIAGNOSIS — I712 Thoracic aortic aneurysm, without rupture, unspecified: Secondary | ICD-10-CM | POA: Diagnosis not present

## 2022-09-01 DIAGNOSIS — K860 Alcohol-induced chronic pancreatitis: Secondary | ICD-10-CM | POA: Diagnosis not present

## 2022-09-01 DIAGNOSIS — F32A Depression, unspecified: Secondary | ICD-10-CM | POA: Diagnosis not present

## 2022-09-01 DIAGNOSIS — R739 Hyperglycemia, unspecified: Secondary | ICD-10-CM | POA: Diagnosis not present

## 2022-09-01 DIAGNOSIS — Z91199 Patient's noncompliance with other medical treatment and regimen due to unspecified reason: Secondary | ICD-10-CM | POA: Diagnosis not present

## 2022-09-01 DIAGNOSIS — I10 Essential (primary) hypertension: Secondary | ICD-10-CM | POA: Diagnosis not present

## 2022-09-02 DIAGNOSIS — R739 Hyperglycemia, unspecified: Secondary | ICD-10-CM | POA: Diagnosis not present

## 2022-09-02 DIAGNOSIS — R4182 Altered mental status, unspecified: Secondary | ICD-10-CM | POA: Diagnosis not present

## 2022-09-02 DIAGNOSIS — F32A Depression, unspecified: Secondary | ICD-10-CM | POA: Diagnosis not present

## 2022-09-02 DIAGNOSIS — I10 Essential (primary) hypertension: Secondary | ICD-10-CM | POA: Diagnosis not present

## 2022-09-02 DIAGNOSIS — K860 Alcohol-induced chronic pancreatitis: Secondary | ICD-10-CM | POA: Diagnosis not present

## 2022-09-02 DIAGNOSIS — Z91199 Patient's noncompliance with other medical treatment and regimen due to unspecified reason: Secondary | ICD-10-CM | POA: Diagnosis not present

## 2022-09-02 DIAGNOSIS — I712 Thoracic aortic aneurysm, without rupture, unspecified: Secondary | ICD-10-CM | POA: Diagnosis not present

## 2022-09-02 DIAGNOSIS — E119 Type 2 diabetes mellitus without complications: Secondary | ICD-10-CM | POA: Diagnosis not present

## 2022-09-03 DIAGNOSIS — R4182 Altered mental status, unspecified: Secondary | ICD-10-CM | POA: Diagnosis not present

## 2022-09-13 DIAGNOSIS — Z7984 Long term (current) use of oral hypoglycemic drugs: Secondary | ICD-10-CM | POA: Diagnosis not present

## 2022-09-13 DIAGNOSIS — E1165 Type 2 diabetes mellitus with hyperglycemia: Secondary | ICD-10-CM | POA: Diagnosis not present

## 2022-09-13 DIAGNOSIS — F17210 Nicotine dependence, cigarettes, uncomplicated: Secondary | ICD-10-CM | POA: Diagnosis not present

## 2022-09-13 DIAGNOSIS — Z760 Encounter for issue of repeat prescription: Secondary | ICD-10-CM | POA: Diagnosis not present

## 2022-09-13 DIAGNOSIS — Z8673 Personal history of transient ischemic attack (TIA), and cerebral infarction without residual deficits: Secondary | ICD-10-CM | POA: Diagnosis not present

## 2022-09-13 DIAGNOSIS — Z7982 Long term (current) use of aspirin: Secondary | ICD-10-CM | POA: Diagnosis not present

## 2022-09-13 DIAGNOSIS — Z794 Long term (current) use of insulin: Secondary | ICD-10-CM | POA: Diagnosis not present

## 2022-09-13 DIAGNOSIS — Z885 Allergy status to narcotic agent status: Secondary | ICD-10-CM | POA: Diagnosis not present

## 2022-09-13 DIAGNOSIS — I10 Essential (primary) hypertension: Secondary | ICD-10-CM | POA: Diagnosis not present

## 2022-09-13 DIAGNOSIS — J449 Chronic obstructive pulmonary disease, unspecified: Secondary | ICD-10-CM | POA: Diagnosis not present

## 2022-09-13 DIAGNOSIS — Z79899 Other long term (current) drug therapy: Secondary | ICD-10-CM | POA: Diagnosis not present

## 2022-09-15 DIAGNOSIS — G4489 Other headache syndrome: Secondary | ICD-10-CM | POA: Diagnosis not present

## 2022-09-15 DIAGNOSIS — N179 Acute kidney failure, unspecified: Secondary | ICD-10-CM | POA: Diagnosis not present

## 2022-09-15 DIAGNOSIS — Z8673 Personal history of transient ischemic attack (TIA), and cerebral infarction without residual deficits: Secondary | ICD-10-CM | POA: Diagnosis not present

## 2022-09-15 DIAGNOSIS — R55 Syncope and collapse: Secondary | ICD-10-CM | POA: Diagnosis not present

## 2022-09-15 DIAGNOSIS — R3912 Poor urinary stream: Secondary | ICD-10-CM | POA: Diagnosis not present

## 2022-09-15 DIAGNOSIS — F17210 Nicotine dependence, cigarettes, uncomplicated: Secondary | ICD-10-CM | POA: Diagnosis not present

## 2022-09-15 DIAGNOSIS — K4020 Bilateral inguinal hernia, without obstruction or gangrene, not specified as recurrent: Secondary | ICD-10-CM | POA: Diagnosis not present

## 2022-09-15 DIAGNOSIS — Z8249 Family history of ischemic heart disease and other diseases of the circulatory system: Secondary | ICD-10-CM | POA: Diagnosis not present

## 2022-09-15 DIAGNOSIS — R739 Hyperglycemia, unspecified: Secondary | ICD-10-CM | POA: Diagnosis not present

## 2022-09-15 DIAGNOSIS — K8689 Other specified diseases of pancreas: Secondary | ICD-10-CM | POA: Diagnosis not present

## 2022-09-15 DIAGNOSIS — I7123 Aneurysm of the descending thoracic aorta, without rupture: Secondary | ICD-10-CM | POA: Diagnosis not present

## 2022-09-15 DIAGNOSIS — Z79899 Other long term (current) drug therapy: Secondary | ICD-10-CM | POA: Diagnosis not present

## 2022-09-15 DIAGNOSIS — G3184 Mild cognitive impairment, so stated: Secondary | ICD-10-CM | POA: Diagnosis not present

## 2022-09-15 DIAGNOSIS — Z5901 Sheltered homelessness: Secondary | ICD-10-CM | POA: Diagnosis not present

## 2022-09-15 DIAGNOSIS — E860 Dehydration: Secondary | ICD-10-CM | POA: Diagnosis not present

## 2022-09-15 DIAGNOSIS — I723 Aneurysm of iliac artery: Secondary | ICD-10-CM | POA: Diagnosis not present

## 2022-09-15 DIAGNOSIS — E1165 Type 2 diabetes mellitus with hyperglycemia: Secondary | ICD-10-CM | POA: Diagnosis not present

## 2022-09-15 DIAGNOSIS — J449 Chronic obstructive pulmonary disease, unspecified: Secondary | ICD-10-CM | POA: Diagnosis not present

## 2022-09-15 DIAGNOSIS — Z7982 Long term (current) use of aspirin: Secondary | ICD-10-CM | POA: Diagnosis not present

## 2022-09-15 DIAGNOSIS — Z833 Family history of diabetes mellitus: Secondary | ICD-10-CM | POA: Diagnosis not present

## 2022-09-15 DIAGNOSIS — I10 Essential (primary) hypertension: Secondary | ICD-10-CM | POA: Diagnosis not present

## 2022-09-15 DIAGNOSIS — I7122 Aneurysm of the aortic arch, without rupture: Secondary | ICD-10-CM | POA: Diagnosis not present

## 2022-09-15 DIAGNOSIS — R197 Diarrhea, unspecified: Secondary | ICD-10-CM | POA: Diagnosis not present

## 2022-09-15 DIAGNOSIS — R42 Dizziness and giddiness: Secondary | ICD-10-CM | POA: Diagnosis not present

## 2022-09-15 DIAGNOSIS — Z23 Encounter for immunization: Secondary | ICD-10-CM | POA: Diagnosis not present

## 2022-09-16 DIAGNOSIS — E119 Type 2 diabetes mellitus without complications: Secondary | ICD-10-CM | POA: Diagnosis not present

## 2022-09-16 DIAGNOSIS — N179 Acute kidney failure, unspecified: Secondary | ICD-10-CM | POA: Diagnosis not present

## 2022-09-16 DIAGNOSIS — I10 Essential (primary) hypertension: Secondary | ICD-10-CM | POA: Diagnosis not present

## 2022-09-16 DIAGNOSIS — R739 Hyperglycemia, unspecified: Secondary | ICD-10-CM | POA: Diagnosis not present

## 2022-09-16 DIAGNOSIS — Z5900 Homelessness unspecified: Secondary | ICD-10-CM | POA: Diagnosis not present

## 2022-09-17 DIAGNOSIS — E1165 Type 2 diabetes mellitus with hyperglycemia: Secondary | ICD-10-CM | POA: Diagnosis not present

## 2022-09-17 DIAGNOSIS — I10 Essential (primary) hypertension: Secondary | ICD-10-CM | POA: Diagnosis not present

## 2022-09-17 DIAGNOSIS — Z5900 Homelessness unspecified: Secondary | ICD-10-CM | POA: Diagnosis not present

## 2022-09-17 DIAGNOSIS — N179 Acute kidney failure, unspecified: Secondary | ICD-10-CM | POA: Diagnosis not present

## 2022-09-17 DIAGNOSIS — R739 Hyperglycemia, unspecified: Secondary | ICD-10-CM | POA: Diagnosis not present

## 2022-09-17 DIAGNOSIS — E119 Type 2 diabetes mellitus without complications: Secondary | ICD-10-CM | POA: Diagnosis not present

## 2022-09-18 DIAGNOSIS — K8689 Other specified diseases of pancreas: Secondary | ICD-10-CM | POA: Diagnosis not present

## 2022-09-18 DIAGNOSIS — E119 Type 2 diabetes mellitus without complications: Secondary | ICD-10-CM | POA: Diagnosis not present

## 2022-09-18 DIAGNOSIS — N179 Acute kidney failure, unspecified: Secondary | ICD-10-CM | POA: Diagnosis not present

## 2022-09-18 DIAGNOSIS — I10 Essential (primary) hypertension: Secondary | ICD-10-CM | POA: Diagnosis not present

## 2022-09-18 DIAGNOSIS — Z5900 Homelessness unspecified: Secondary | ICD-10-CM | POA: Diagnosis not present

## 2022-09-18 DIAGNOSIS — R739 Hyperglycemia, unspecified: Secondary | ICD-10-CM | POA: Diagnosis not present

## 2022-09-19 DIAGNOSIS — E119 Type 2 diabetes mellitus without complications: Secondary | ICD-10-CM | POA: Diagnosis not present

## 2022-09-19 DIAGNOSIS — Z8673 Personal history of transient ischemic attack (TIA), and cerebral infarction without residual deficits: Secondary | ICD-10-CM | POA: Diagnosis not present

## 2022-09-19 DIAGNOSIS — R739 Hyperglycemia, unspecified: Secondary | ICD-10-CM | POA: Diagnosis not present

## 2022-09-19 DIAGNOSIS — Z5900 Homelessness unspecified: Secondary | ICD-10-CM | POA: Diagnosis not present

## 2022-09-19 DIAGNOSIS — I10 Essential (primary) hypertension: Secondary | ICD-10-CM | POA: Diagnosis not present

## 2022-09-19 DIAGNOSIS — G3184 Mild cognitive impairment, so stated: Secondary | ICD-10-CM | POA: Diagnosis not present

## 2022-09-19 DIAGNOSIS — I7123 Aneurysm of the descending thoracic aorta, without rupture: Secondary | ICD-10-CM | POA: Diagnosis not present

## 2022-09-19 DIAGNOSIS — K4020 Bilateral inguinal hernia, without obstruction or gangrene, not specified as recurrent: Secondary | ICD-10-CM | POA: Diagnosis not present

## 2022-09-19 DIAGNOSIS — E1165 Type 2 diabetes mellitus with hyperglycemia: Secondary | ICD-10-CM | POA: Diagnosis not present

## 2022-09-19 DIAGNOSIS — N179 Acute kidney failure, unspecified: Secondary | ICD-10-CM | POA: Diagnosis not present

## 2022-09-20 DIAGNOSIS — K4020 Bilateral inguinal hernia, without obstruction or gangrene, not specified as recurrent: Secondary | ICD-10-CM | POA: Diagnosis not present

## 2022-09-20 DIAGNOSIS — I10 Essential (primary) hypertension: Secondary | ICD-10-CM | POA: Diagnosis not present

## 2022-09-20 DIAGNOSIS — G3184 Mild cognitive impairment, so stated: Secondary | ICD-10-CM | POA: Diagnosis not present

## 2022-09-20 DIAGNOSIS — E119 Type 2 diabetes mellitus without complications: Secondary | ICD-10-CM | POA: Diagnosis not present

## 2022-09-20 DIAGNOSIS — R739 Hyperglycemia, unspecified: Secondary | ICD-10-CM | POA: Diagnosis not present

## 2022-09-20 DIAGNOSIS — Z8673 Personal history of transient ischemic attack (TIA), and cerebral infarction without residual deficits: Secondary | ICD-10-CM | POA: Diagnosis not present

## 2022-09-20 DIAGNOSIS — I7123 Aneurysm of the descending thoracic aorta, without rupture: Secondary | ICD-10-CM | POA: Diagnosis not present

## 2022-09-20 DIAGNOSIS — N179 Acute kidney failure, unspecified: Secondary | ICD-10-CM | POA: Diagnosis not present

## 2022-09-20 DIAGNOSIS — Z5900 Homelessness unspecified: Secondary | ICD-10-CM | POA: Diagnosis not present

## 2022-09-20 DIAGNOSIS — E1165 Type 2 diabetes mellitus with hyperglycemia: Secondary | ICD-10-CM | POA: Diagnosis not present

## 2022-09-21 DIAGNOSIS — Y909 Presence of alcohol in blood, level not specified: Secondary | ICD-10-CM | POA: Diagnosis not present

## 2022-09-21 DIAGNOSIS — G4489 Other headache syndrome: Secondary | ICD-10-CM | POA: Diagnosis not present

## 2022-09-21 DIAGNOSIS — R739 Hyperglycemia, unspecified: Secondary | ICD-10-CM | POA: Diagnosis not present

## 2022-09-21 DIAGNOSIS — Z720 Tobacco use: Secondary | ICD-10-CM | POA: Diagnosis not present

## 2022-09-21 DIAGNOSIS — I959 Hypotension, unspecified: Secondary | ICD-10-CM | POA: Diagnosis not present

## 2022-09-21 DIAGNOSIS — E1165 Type 2 diabetes mellitus with hyperglycemia: Secondary | ICD-10-CM | POA: Diagnosis not present

## 2022-09-22 DIAGNOSIS — Z720 Tobacco use: Secondary | ICD-10-CM | POA: Diagnosis not present

## 2022-09-22 DIAGNOSIS — I10 Essential (primary) hypertension: Secondary | ICD-10-CM | POA: Diagnosis not present

## 2022-09-22 DIAGNOSIS — Y909 Presence of alcohol in blood, level not specified: Secondary | ICD-10-CM | POA: Diagnosis not present

## 2022-09-22 DIAGNOSIS — Z5900 Homelessness unspecified: Secondary | ICD-10-CM | POA: Diagnosis not present

## 2022-09-22 DIAGNOSIS — Z765 Malingerer [conscious simulation]: Secondary | ICD-10-CM | POA: Diagnosis not present

## 2022-09-22 DIAGNOSIS — F17210 Nicotine dependence, cigarettes, uncomplicated: Secondary | ICD-10-CM | POA: Diagnosis not present

## 2022-09-22 DIAGNOSIS — E1165 Type 2 diabetes mellitus with hyperglycemia: Secondary | ICD-10-CM | POA: Diagnosis not present

## 2022-09-22 DIAGNOSIS — E119 Type 2 diabetes mellitus without complications: Secondary | ICD-10-CM | POA: Diagnosis not present

## 2022-09-24 ENCOUNTER — Telehealth: Payer: Self-pay | Admitting: Licensed Clinical Social Worker

## 2022-09-24 DIAGNOSIS — Z794 Long term (current) use of insulin: Secondary | ICD-10-CM | POA: Diagnosis not present

## 2022-09-24 DIAGNOSIS — F17210 Nicotine dependence, cigarettes, uncomplicated: Secondary | ICD-10-CM | POA: Diagnosis not present

## 2022-09-24 DIAGNOSIS — Z5901 Sheltered homelessness: Secondary | ICD-10-CM | POA: Diagnosis not present

## 2022-09-24 DIAGNOSIS — E0865 Diabetes mellitus due to underlying condition with hyperglycemia: Secondary | ICD-10-CM | POA: Diagnosis not present

## 2022-09-24 DIAGNOSIS — J449 Chronic obstructive pulmonary disease, unspecified: Secondary | ICD-10-CM | POA: Diagnosis not present

## 2022-09-24 DIAGNOSIS — Z885 Allergy status to narcotic agent status: Secondary | ICD-10-CM | POA: Diagnosis not present

## 2022-09-24 DIAGNOSIS — E1165 Type 2 diabetes mellitus with hyperglycemia: Secondary | ICD-10-CM | POA: Diagnosis not present

## 2022-09-24 DIAGNOSIS — Z91148 Patient's other noncompliance with medication regimen for other reason: Secondary | ICD-10-CM | POA: Diagnosis not present

## 2022-09-24 NOTE — Patient Outreach (Addendum)
Transition Care Management Unsuccessful Follow-up Telephone Call  Date of discharge and from where:  09/22/22 from [facility]  Attempts:  1st Attempt  Reason for unsuccessful TCM follow-up call:  Unable to leave message  Dickie La, BSW, MSW, [employer] LCSW [facility]  [facility] [email or web address] Phone: [phone]

## 2022-09-26 DIAGNOSIS — E1165 Type 2 diabetes mellitus with hyperglycemia: Secondary | ICD-10-CM | POA: Diagnosis not present

## 2022-09-26 DIAGNOSIS — Z794 Long term (current) use of insulin: Secondary | ICD-10-CM | POA: Diagnosis not present

## 2022-09-26 DIAGNOSIS — E1169 Type 2 diabetes mellitus with other specified complication: Secondary | ICD-10-CM | POA: Diagnosis not present

## 2022-09-29 DIAGNOSIS — R739 Hyperglycemia, unspecified: Secondary | ICD-10-CM | POA: Diagnosis not present

## 2022-09-29 DIAGNOSIS — E1165 Type 2 diabetes mellitus with hyperglycemia: Secondary | ICD-10-CM | POA: Diagnosis not present

## 2022-09-29 DIAGNOSIS — R531 Weakness: Secondary | ICD-10-CM | POA: Diagnosis not present

## 2022-10-04 DIAGNOSIS — E1165 Type 2 diabetes mellitus with hyperglycemia: Secondary | ICD-10-CM | POA: Diagnosis not present

## 2022-10-04 DIAGNOSIS — Z91119 Patient's noncompliance with dietary regimen due to unspecified reason: Secondary | ICD-10-CM | POA: Diagnosis not present

## 2022-10-05 DIAGNOSIS — I10 Essential (primary) hypertension: Secondary | ICD-10-CM | POA: Diagnosis not present

## 2022-10-05 DIAGNOSIS — Z885 Allergy status to narcotic agent status: Secondary | ICD-10-CM | POA: Diagnosis not present

## 2022-10-05 DIAGNOSIS — E1165 Type 2 diabetes mellitus with hyperglycemia: Secondary | ICD-10-CM | POA: Diagnosis not present

## 2022-10-05 DIAGNOSIS — J449 Chronic obstructive pulmonary disease, unspecified: Secondary | ICD-10-CM | POA: Diagnosis not present

## 2022-10-05 DIAGNOSIS — R739 Hyperglycemia, unspecified: Secondary | ICD-10-CM | POA: Diagnosis not present

## 2022-10-05 DIAGNOSIS — Z91148 Patient's other noncompliance with medication regimen for other reason: Secondary | ICD-10-CM | POA: Diagnosis not present

## 2022-10-05 DIAGNOSIS — Z7982 Long term (current) use of aspirin: Secondary | ICD-10-CM | POA: Diagnosis not present

## 2022-10-05 DIAGNOSIS — Z794 Long term (current) use of insulin: Secondary | ICD-10-CM | POA: Diagnosis not present

## 2022-10-05 DIAGNOSIS — Z79899 Other long term (current) drug therapy: Secondary | ICD-10-CM | POA: Diagnosis not present

## 2022-10-05 DIAGNOSIS — Z91119 Patient's noncompliance with dietary regimen due to unspecified reason: Secondary | ICD-10-CM | POA: Diagnosis not present

## 2022-10-05 DIAGNOSIS — F17210 Nicotine dependence, cigarettes, uncomplicated: Secondary | ICD-10-CM | POA: Diagnosis not present

## 2022-10-11 DIAGNOSIS — I10 Essential (primary) hypertension: Secondary | ICD-10-CM | POA: Diagnosis not present

## 2022-10-11 DIAGNOSIS — E119 Type 2 diabetes mellitus without complications: Secondary | ICD-10-CM | POA: Diagnosis not present

## 2022-10-11 DIAGNOSIS — J449 Chronic obstructive pulmonary disease, unspecified: Secondary | ICD-10-CM | POA: Diagnosis not present

## 2022-10-11 DIAGNOSIS — Z79899 Other long term (current) drug therapy: Secondary | ICD-10-CM | POA: Diagnosis not present

## 2022-10-11 DIAGNOSIS — Z794 Long term (current) use of insulin: Secondary | ICD-10-CM | POA: Diagnosis not present

## 2022-10-11 DIAGNOSIS — F17210 Nicotine dependence, cigarettes, uncomplicated: Secondary | ICD-10-CM | POA: Diagnosis not present

## 2022-10-11 DIAGNOSIS — Z91148 Patient's other noncompliance with medication regimen for other reason: Secondary | ICD-10-CM | POA: Diagnosis not present

## 2022-10-11 DIAGNOSIS — Z882 Allergy status to sulfonamides status: Secondary | ICD-10-CM | POA: Diagnosis not present

## 2022-10-11 DIAGNOSIS — E1165 Type 2 diabetes mellitus with hyperglycemia: Secondary | ICD-10-CM | POA: Diagnosis not present

## 2022-10-25 DIAGNOSIS — I712 Thoracic aortic aneurysm, without rupture, unspecified: Secondary | ICD-10-CM | POA: Diagnosis not present

## 2022-10-25 DIAGNOSIS — I723 Aneurysm of iliac artery: Secondary | ICD-10-CM | POA: Diagnosis not present

## 2022-10-25 DIAGNOSIS — K209 Esophagitis, unspecified without bleeding: Secondary | ICD-10-CM | POA: Diagnosis not present

## 2022-10-31 DIAGNOSIS — Z7982 Long term (current) use of aspirin: Secondary | ICD-10-CM | POA: Diagnosis not present

## 2022-10-31 DIAGNOSIS — Z8249 Family history of ischemic heart disease and other diseases of the circulatory system: Secondary | ICD-10-CM | POA: Diagnosis not present

## 2022-10-31 DIAGNOSIS — Z8673 Personal history of transient ischemic attack (TIA), and cerebral infarction without residual deficits: Secondary | ICD-10-CM | POA: Diagnosis not present

## 2022-10-31 DIAGNOSIS — Z20822 Contact with and (suspected) exposure to covid-19: Secondary | ICD-10-CM | POA: Diagnosis not present

## 2022-10-31 DIAGNOSIS — Z794 Long term (current) use of insulin: Secondary | ICD-10-CM | POA: Diagnosis not present

## 2022-10-31 DIAGNOSIS — I10 Essential (primary) hypertension: Secondary | ICD-10-CM | POA: Diagnosis not present

## 2022-10-31 DIAGNOSIS — E1165 Type 2 diabetes mellitus with hyperglycemia: Secondary | ICD-10-CM | POA: Diagnosis not present

## 2022-10-31 DIAGNOSIS — R634 Abnormal weight loss: Secondary | ICD-10-CM | POA: Diagnosis not present

## 2022-10-31 DIAGNOSIS — R627 Adult failure to thrive: Secondary | ICD-10-CM | POA: Diagnosis not present

## 2022-10-31 DIAGNOSIS — R2689 Other abnormalities of gait and mobility: Secondary | ICD-10-CM | POA: Diagnosis not present

## 2022-10-31 DIAGNOSIS — Z5941 Food insecurity: Secondary | ICD-10-CM | POA: Diagnosis not present

## 2022-10-31 DIAGNOSIS — Z79899 Other long term (current) drug therapy: Secondary | ICD-10-CM | POA: Diagnosis not present

## 2022-10-31 DIAGNOSIS — J449 Chronic obstructive pulmonary disease, unspecified: Secondary | ICD-10-CM | POA: Diagnosis not present

## 2022-10-31 DIAGNOSIS — R32 Unspecified urinary incontinence: Secondary | ICD-10-CM | POA: Diagnosis not present

## 2022-10-31 DIAGNOSIS — F17210 Nicotine dependence, cigarettes, uncomplicated: Secondary | ICD-10-CM | POA: Diagnosis not present

## 2022-10-31 DIAGNOSIS — R4189 Other symptoms and signs involving cognitive functions and awareness: Secondary | ICD-10-CM | POA: Diagnosis not present

## 2022-10-31 DIAGNOSIS — I712 Thoracic aortic aneurysm, without rupture, unspecified: Secondary | ICD-10-CM | POA: Diagnosis not present

## 2022-10-31 DIAGNOSIS — Z5900 Homelessness unspecified: Secondary | ICD-10-CM | POA: Diagnosis not present

## 2022-10-31 DIAGNOSIS — K209 Esophagitis, unspecified without bleeding: Secondary | ICD-10-CM | POA: Diagnosis not present

## 2022-10-31 DIAGNOSIS — N400 Enlarged prostate without lower urinary tract symptoms: Secondary | ICD-10-CM | POA: Diagnosis not present

## 2022-10-31 DIAGNOSIS — Z6820 Body mass index (BMI) 20.0-20.9, adult: Secondary | ICD-10-CM | POA: Diagnosis not present

## 2022-10-31 DIAGNOSIS — E11649 Type 2 diabetes mellitus with hypoglycemia without coma: Secondary | ICD-10-CM | POA: Diagnosis not present

## 2022-10-31 DIAGNOSIS — Z885 Allergy status to narcotic agent status: Secondary | ICD-10-CM | POA: Diagnosis not present

## 2022-11-01 DIAGNOSIS — R634 Abnormal weight loss: Secondary | ICD-10-CM | POA: Diagnosis not present

## 2022-11-02 DIAGNOSIS — R634 Abnormal weight loss: Secondary | ICD-10-CM | POA: Diagnosis not present

## 2022-11-06 DIAGNOSIS — R739 Hyperglycemia, unspecified: Secondary | ICD-10-CM | POA: Diagnosis not present

## 2022-11-17 DIAGNOSIS — Z7982 Long term (current) use of aspirin: Secondary | ICD-10-CM | POA: Diagnosis not present

## 2022-11-17 DIAGNOSIS — Z8673 Personal history of transient ischemic attack (TIA), and cerebral infarction without residual deficits: Secondary | ICD-10-CM | POA: Diagnosis not present

## 2022-11-17 DIAGNOSIS — J449 Chronic obstructive pulmonary disease, unspecified: Secondary | ICD-10-CM | POA: Diagnosis not present

## 2022-11-17 DIAGNOSIS — Z79899 Other long term (current) drug therapy: Secondary | ICD-10-CM | POA: Diagnosis not present

## 2022-11-17 DIAGNOSIS — R918 Other nonspecific abnormal finding of lung field: Secondary | ICD-10-CM | POA: Diagnosis not present

## 2022-11-17 DIAGNOSIS — Z794 Long term (current) use of insulin: Secondary | ICD-10-CM | POA: Diagnosis not present

## 2022-11-17 DIAGNOSIS — E11649 Type 2 diabetes mellitus with hypoglycemia without coma: Secondary | ICD-10-CM | POA: Diagnosis not present

## 2022-11-17 DIAGNOSIS — Z885 Allergy status to narcotic agent status: Secondary | ICD-10-CM | POA: Diagnosis not present

## 2022-11-17 DIAGNOSIS — F17210 Nicotine dependence, cigarettes, uncomplicated: Secondary | ICD-10-CM | POA: Diagnosis not present

## 2022-11-17 DIAGNOSIS — Z20822 Contact with and (suspected) exposure to covid-19: Secondary | ICD-10-CM | POA: Diagnosis not present

## 2022-11-17 DIAGNOSIS — Z5901 Sheltered homelessness: Secondary | ICD-10-CM | POA: Diagnosis not present

## 2022-11-17 DIAGNOSIS — Z1152 Encounter for screening for COVID-19: Secondary | ICD-10-CM | POA: Diagnosis not present

## 2022-11-18 DIAGNOSIS — E11649 Type 2 diabetes mellitus with hypoglycemia without coma: Secondary | ICD-10-CM | POA: Diagnosis not present

## 2022-11-18 DIAGNOSIS — Z79899 Other long term (current) drug therapy: Secondary | ICD-10-CM | POA: Diagnosis not present

## 2022-11-18 DIAGNOSIS — Z794 Long term (current) use of insulin: Secondary | ICD-10-CM | POA: Diagnosis not present

## 2022-11-18 DIAGNOSIS — R410 Disorientation, unspecified: Secondary | ICD-10-CM | POA: Diagnosis not present

## 2022-11-18 DIAGNOSIS — E162 Hypoglycemia, unspecified: Secondary | ICD-10-CM | POA: Diagnosis not present

## 2022-11-18 DIAGNOSIS — J449 Chronic obstructive pulmonary disease, unspecified: Secondary | ICD-10-CM | POA: Diagnosis not present

## 2022-11-18 DIAGNOSIS — F17210 Nicotine dependence, cigarettes, uncomplicated: Secondary | ICD-10-CM | POA: Diagnosis not present

## 2022-11-18 DIAGNOSIS — S39012A Strain of muscle, fascia and tendon of lower back, initial encounter: Secondary | ICD-10-CM | POA: Diagnosis not present

## 2022-11-18 DIAGNOSIS — Z885 Allergy status to narcotic agent status: Secondary | ICD-10-CM | POA: Diagnosis not present

## 2022-11-18 DIAGNOSIS — Z7982 Long term (current) use of aspirin: Secondary | ICD-10-CM | POA: Diagnosis not present

## 2022-11-18 DIAGNOSIS — E119 Type 2 diabetes mellitus without complications: Secondary | ICD-10-CM | POA: Diagnosis not present

## 2022-11-18 DIAGNOSIS — Z8673 Personal history of transient ischemic attack (TIA), and cerebral infarction without residual deficits: Secondary | ICD-10-CM | POA: Diagnosis not present

## 2022-11-21 DIAGNOSIS — F17210 Nicotine dependence, cigarettes, uncomplicated: Secondary | ICD-10-CM | POA: Diagnosis not present

## 2022-11-21 DIAGNOSIS — J449 Chronic obstructive pulmonary disease, unspecified: Secondary | ICD-10-CM | POA: Diagnosis not present

## 2022-11-21 DIAGNOSIS — Z79899 Other long term (current) drug therapy: Secondary | ICD-10-CM | POA: Diagnosis not present

## 2022-11-21 DIAGNOSIS — R001 Bradycardia, unspecified: Secondary | ICD-10-CM | POA: Diagnosis not present

## 2022-11-21 DIAGNOSIS — R531 Weakness: Secondary | ICD-10-CM | POA: Diagnosis not present

## 2022-11-21 DIAGNOSIS — Z794 Long term (current) use of insulin: Secondary | ICD-10-CM | POA: Diagnosis not present

## 2022-11-21 DIAGNOSIS — R9431 Abnormal electrocardiogram [ECG] [EKG]: Secondary | ICD-10-CM | POA: Diagnosis not present

## 2022-11-21 DIAGNOSIS — E11649 Type 2 diabetes mellitus with hypoglycemia without coma: Secondary | ICD-10-CM | POA: Diagnosis not present

## 2022-11-21 DIAGNOSIS — T68XXXA Hypothermia, initial encounter: Secondary | ICD-10-CM | POA: Diagnosis not present

## 2022-11-21 DIAGNOSIS — Z8249 Family history of ischemic heart disease and other diseases of the circulatory system: Secondary | ICD-10-CM | POA: Diagnosis not present

## 2022-11-21 DIAGNOSIS — Z885 Allergy status to narcotic agent status: Secondary | ICD-10-CM | POA: Diagnosis not present

## 2022-11-21 DIAGNOSIS — I10 Essential (primary) hypertension: Secondary | ICD-10-CM | POA: Diagnosis not present

## 2022-11-21 DIAGNOSIS — Z5941 Food insecurity: Secondary | ICD-10-CM | POA: Diagnosis not present

## 2022-11-21 DIAGNOSIS — Z8673 Personal history of transient ischemic attack (TIA), and cerebral infarction without residual deficits: Secondary | ICD-10-CM | POA: Diagnosis not present

## 2022-11-22 DIAGNOSIS — E11649 Type 2 diabetes mellitus with hypoglycemia without coma: Secondary | ICD-10-CM | POA: Diagnosis not present

## 2022-11-25 DIAGNOSIS — R4182 Altered mental status, unspecified: Secondary | ICD-10-CM | POA: Diagnosis not present

## 2022-11-25 DIAGNOSIS — E11649 Type 2 diabetes mellitus with hypoglycemia without coma: Secondary | ICD-10-CM | POA: Diagnosis not present

## 2023-08-10 IMAGING — CT CT HEAD W/O CM
4 series · 15 of 47 positions shown, 17 images · non-contrast
Comparison: Brain MRI 12/09/2006.  Head CT 12/14/2018.

CLINICAL DATA: 65-year-old female status post fall at home. Right
eye laceration and swelling.

EXAM:
CT HEAD WITHOUT CONTRAST
TECHNIQUE: Contiguous axial images were obtained from the base of the skull
through the vertex without intravenous contrast.

[Series 3: head without · axial · non-contrast · 0.50mm/px · z∈[-106,+14]mm · 7 of 34 slices shown, 9 images]
[im 5/34  brain]
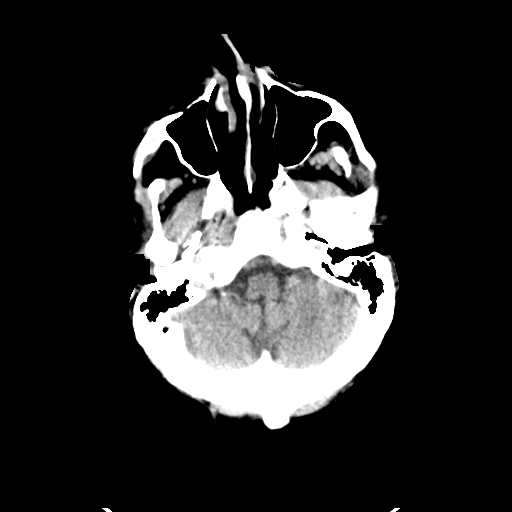
[im 5/34  bone]
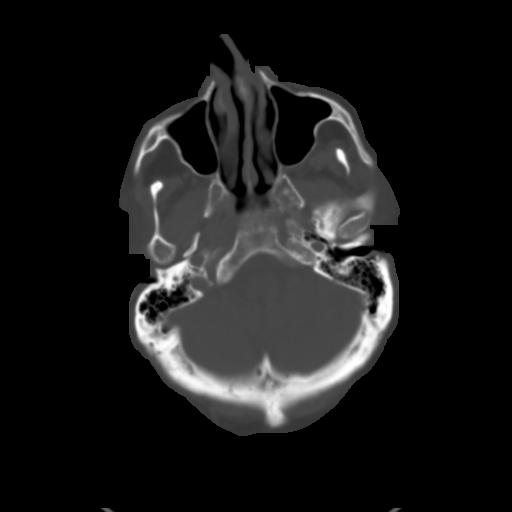
[im 9/34  brain]
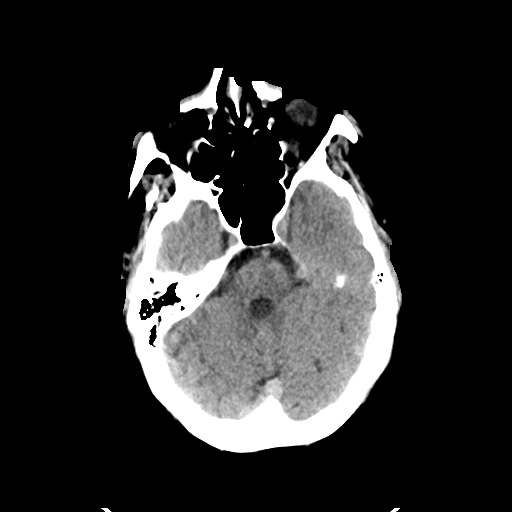
[im 13/34  brain]
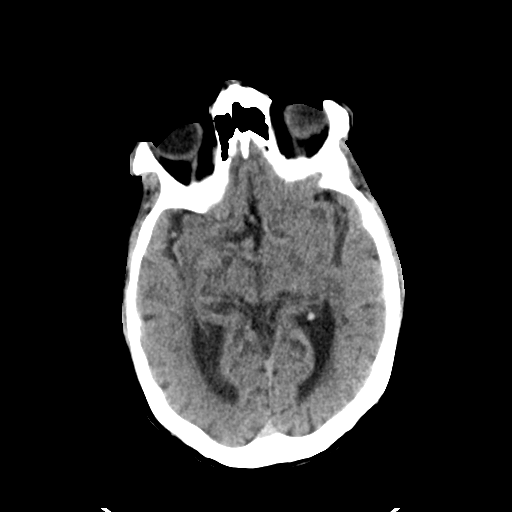
[im 17/34  brain]
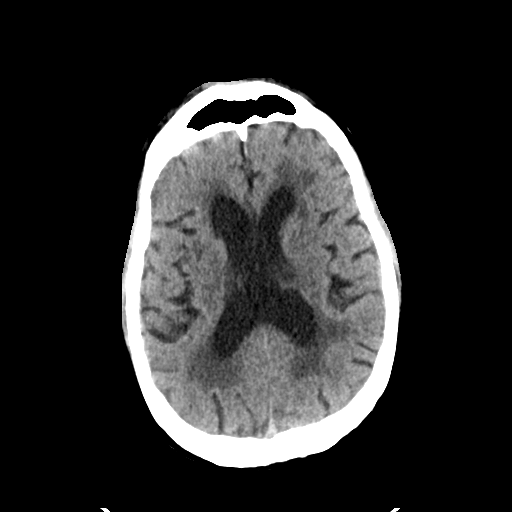
[im 21/34  brain]
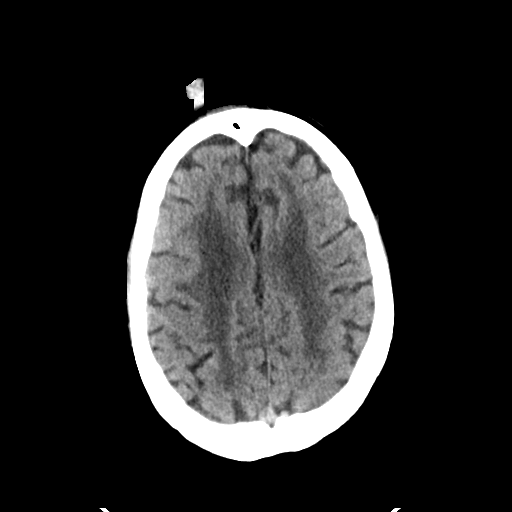
[im 21/34  bone]
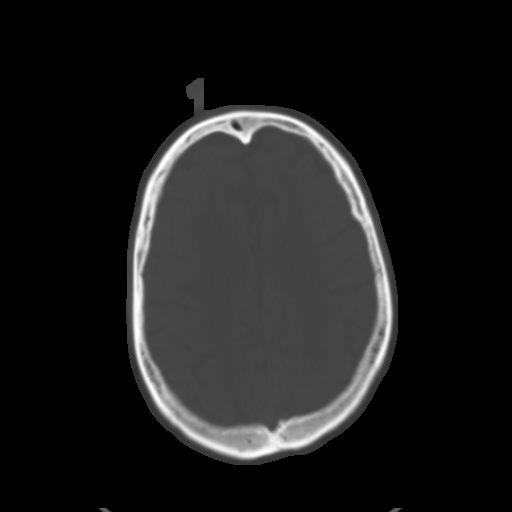
[im 25/34  brain]
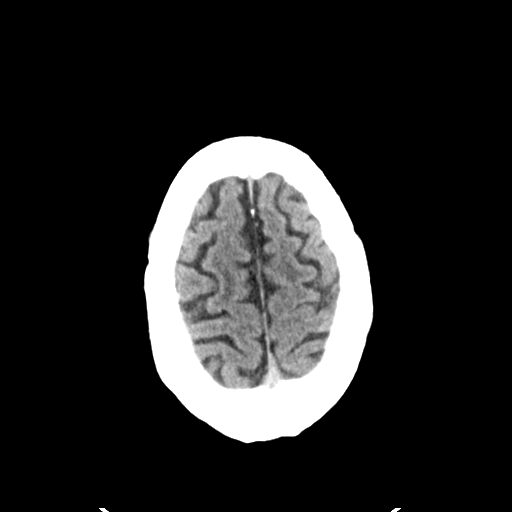
[im 29/34  brain]
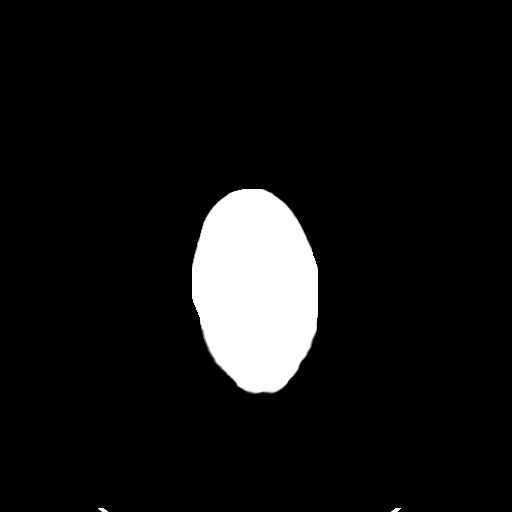

[Series 4: head bone · axial · 0.50mm/px · z∈[-110,-94]mm · 2 of 84 slices shown]
[im 9/84  bone]
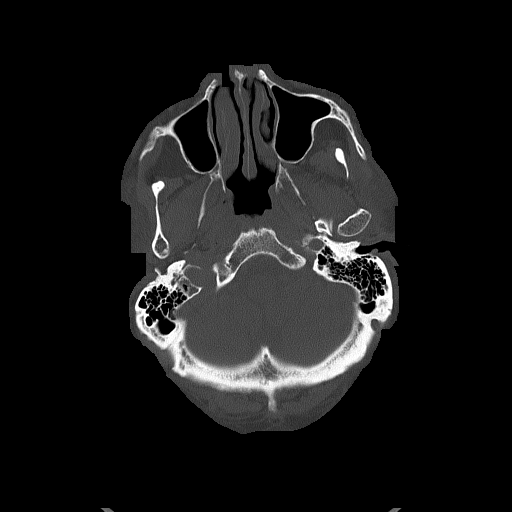
[im 17/84  bone]
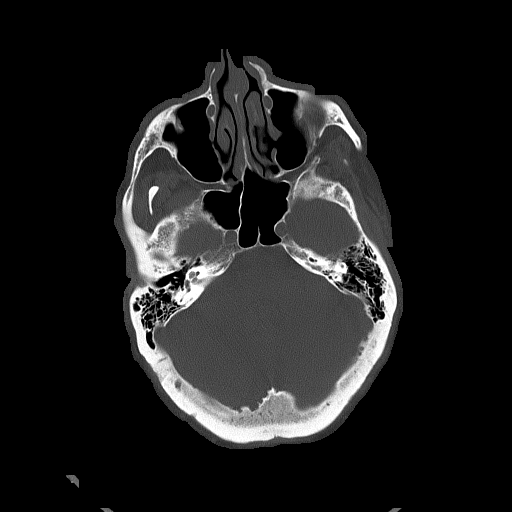

[Series 5: head without cor · coronal · non-contrast · 0.33mm/px · 3 of 81 slices shown]
[im 27/81  brain]
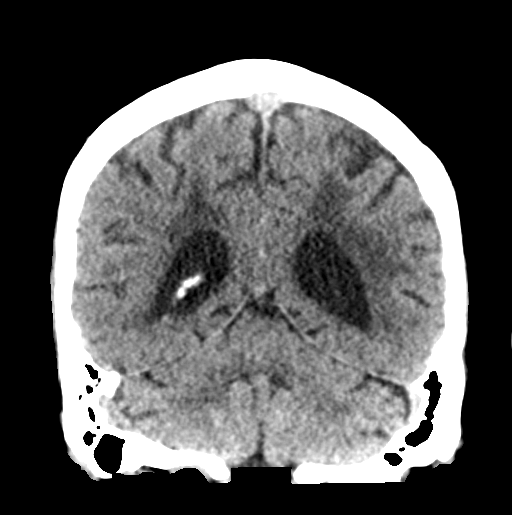
[im 36/81  brain]
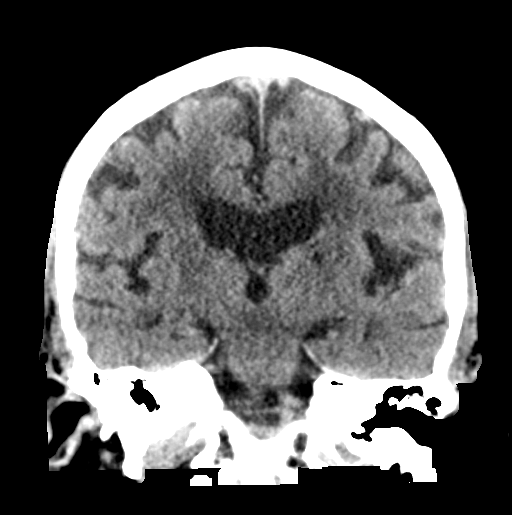
[im 45/81  brain]
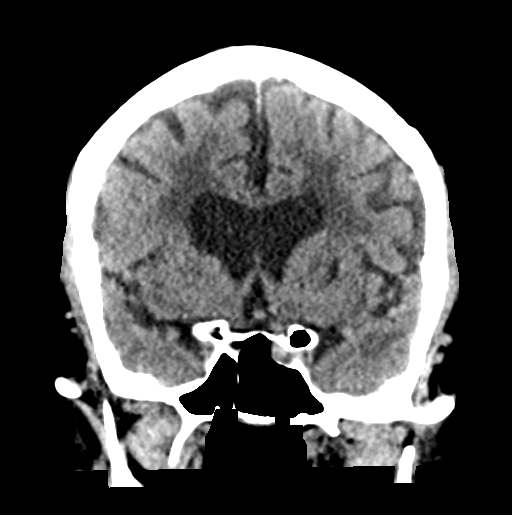

[Series 6: head without sag · sagittal · non-contrast · 0.32mm/px · 3 of 67 slices shown]
[im 23/67  brain]
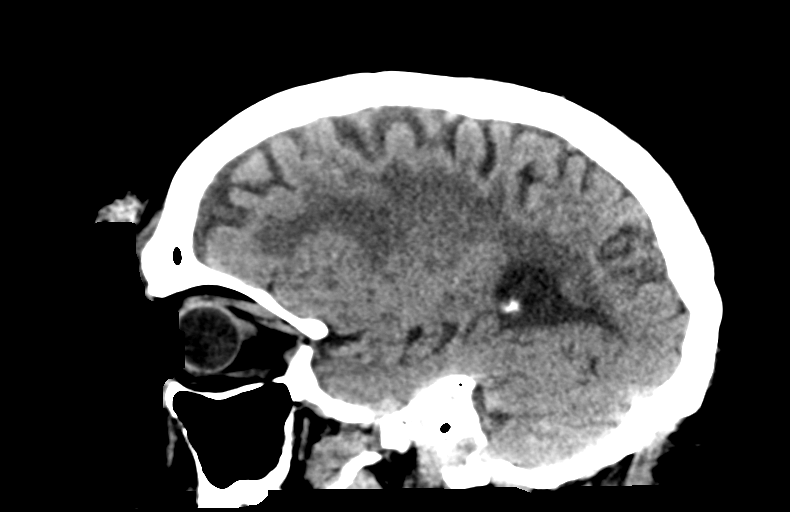
[im 34/67  brain]
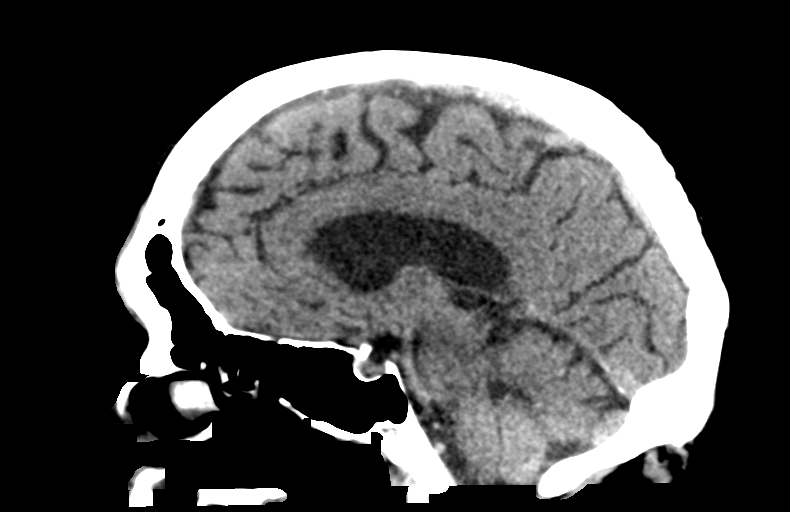
[im 45/67  brain]
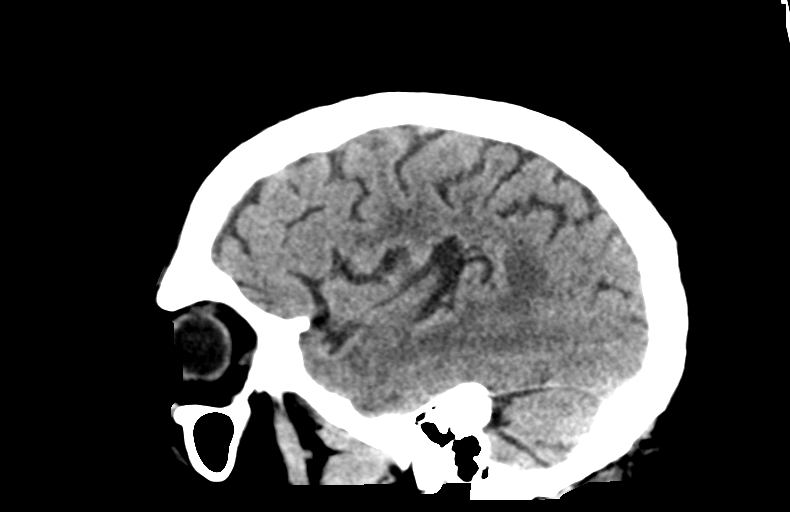

[15 of 47 positions shown; findings below may reference images not displayed]

FINDINGS: Brain: Chronic but progressed and confluent bilateral cerebral white
matter hypodensity. Multifocal bilateral deep gray nuclei lacunar
infarcts are also increased since 1111. Brainstem and cerebellum
appear relatively spared.

No midline shift, ventriculomegaly, mass effect, evidence of mass
lesion, intracranial hemorrhage or evidence of cortically based
acute infarction.

Vascular: Calcified atherosclerosis at the skull base.

Skull: Calvarium intact.  Facial bones are reported separately.

Sinuses/Orbits: Visualized paranasal sinuses and mastoids are stable
and well aerated.

Other: Right forehead scalp hematoma. Underlying frontal bones
appear intact. Globes and orbits soft tissues remain within normal
limits. There is right anterior face, premalar soft tissue swelling
and stranding. See face CT reported separately.
IMPRESSION: 1. Right scalp soft tissue injury without underlying skull fracture.
See also Face CT reported separately.

2. No acute intracranial abnormality. Advanced small vessel disease
with progression since [DATE].

## 2023-08-10 IMAGING — MR MR CERVICAL SPINE W/O CM
5 series · 37 of 48 positions shown · non-contrast
Comparison: CT cervical spine from same day.

CLINICAL DATA: Fall.

EXAM:
MRI CERVICAL SPINE WITHOUT CONTRAST
TECHNIQUE: Multiplanar, multisequence MR imaging of the cervical spine was
performed. No intravenous contrast was administered.

[Series 5: T2 · sagittal · 3.0mm · 0.69mm/px · 6 of 15 slices shown (1 of 2)]
[im 1/15]
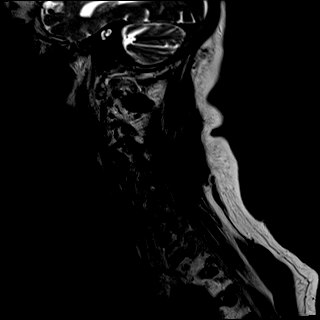
[im 3/15]
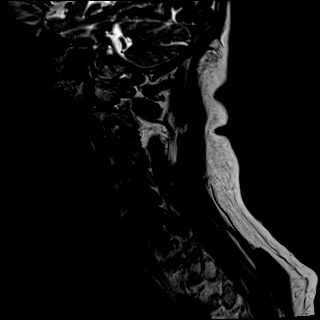
[im 6/15]
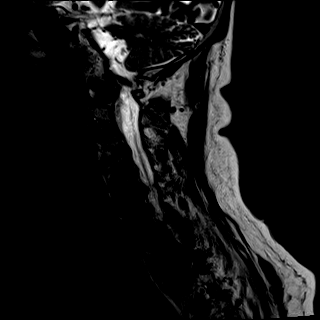
[im 9/15]
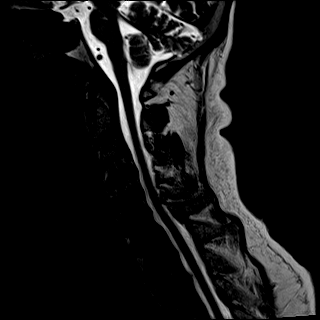
[im 12/15]
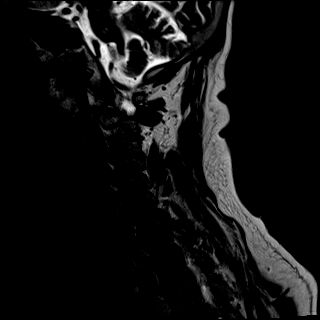
[im 15/15]
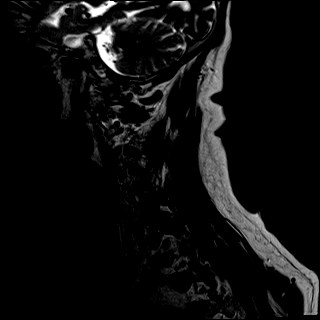

[Series 6: T1 · sagittal · 3.0mm · 0.69mm/px · 6 of 15 slices shown]
[im 1/15]
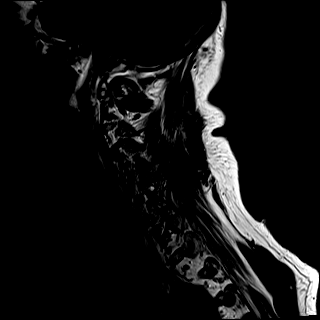
[im 3/15]
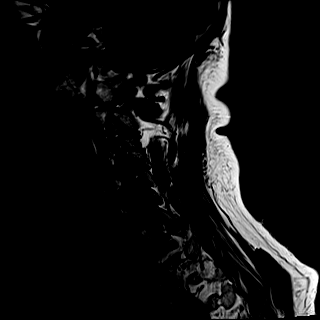
[im 6/15]
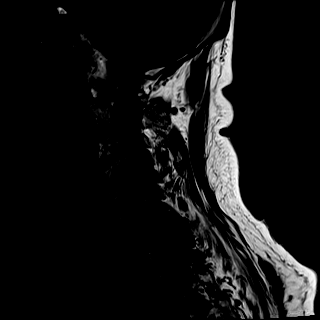
[im 9/15]
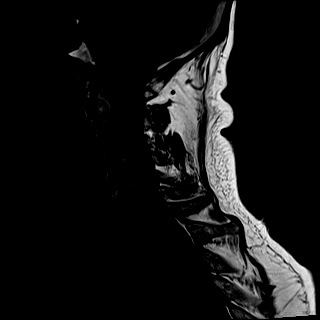
[im 12/15]
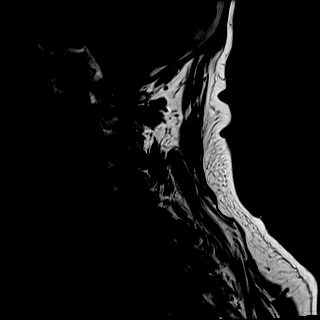
[im 15/15]
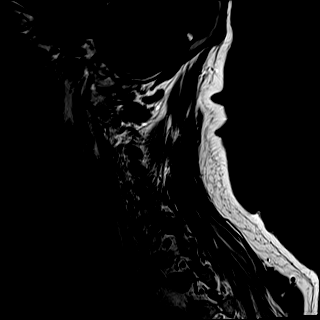

[Series 7: STIR · sagittal · 3.0mm · 0.86mm/px · 6 of 15 slices shown]
[im 1/15]
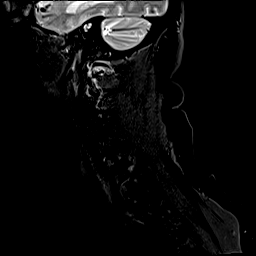
[im 3/15]
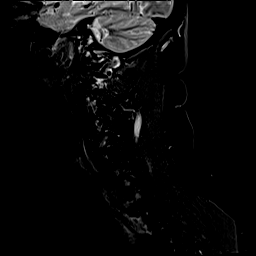
[im 6/15]
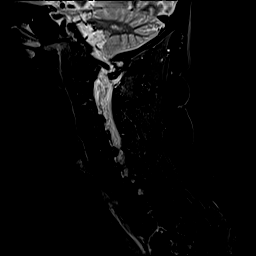
[im 9/15]
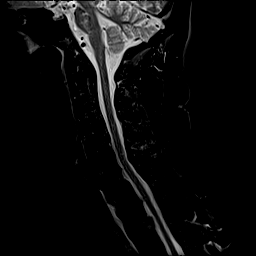
[im 12/15]
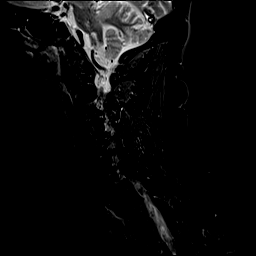
[im 15/15]
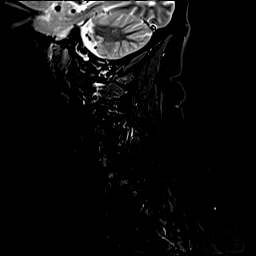

[Series 8: T2 · axial · 3.0mm · 0.66mm/px · z∈[-153,-38]mm · 11 of 40 slices shown (2 of 2)]
[im 1/40]
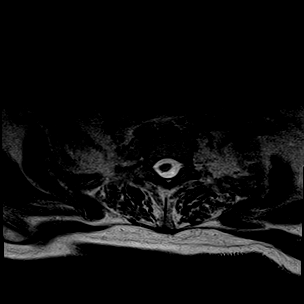
[im 3/40]
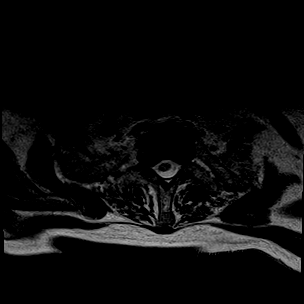
[im 6/40]
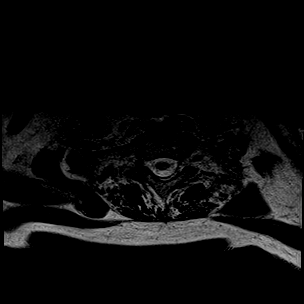
[im 9/40]
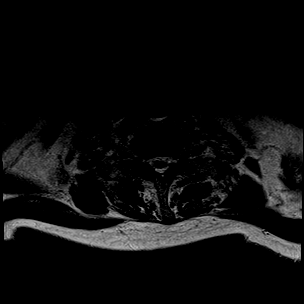
[im 12/40]
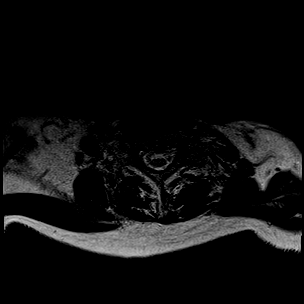
[im 17/40]
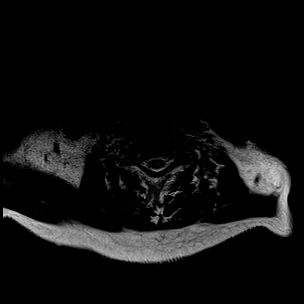
[im 20/40]
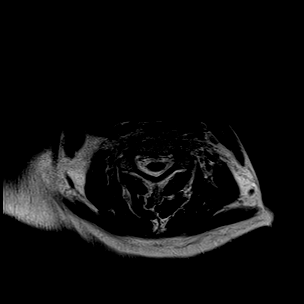
[im 23/40]
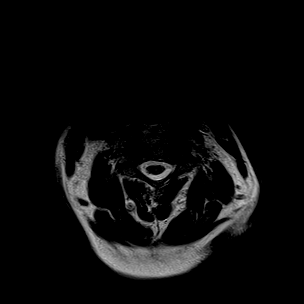
[im 28/40]
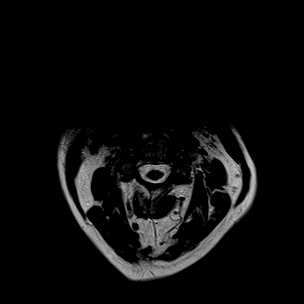
[im 34/40]
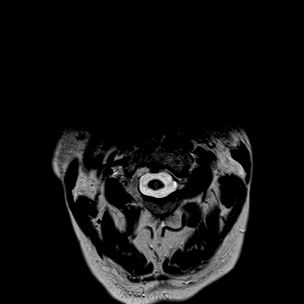
[im 40/40]
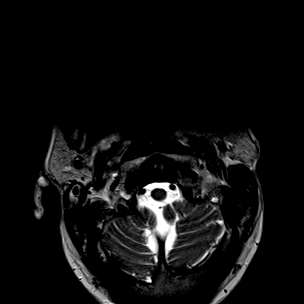

[Series 9: GRE · axial · 3.0mm · 0.39mm/px · z∈[-153,-38]mm · 8 of 40 slices shown]
[im 1/40]
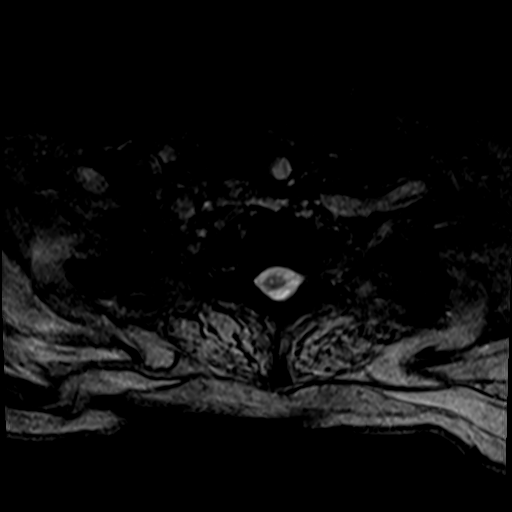
[im 6/40]
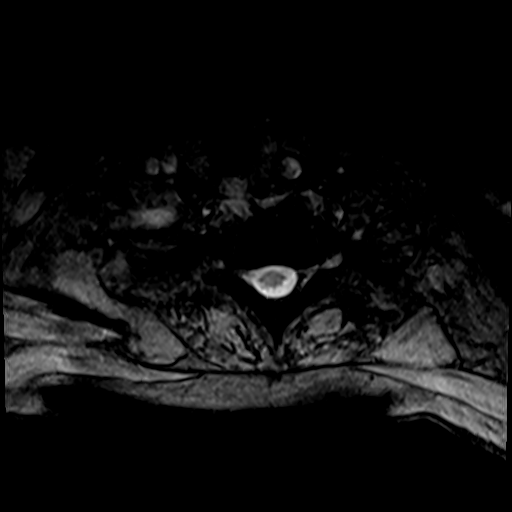
[im 12/40]
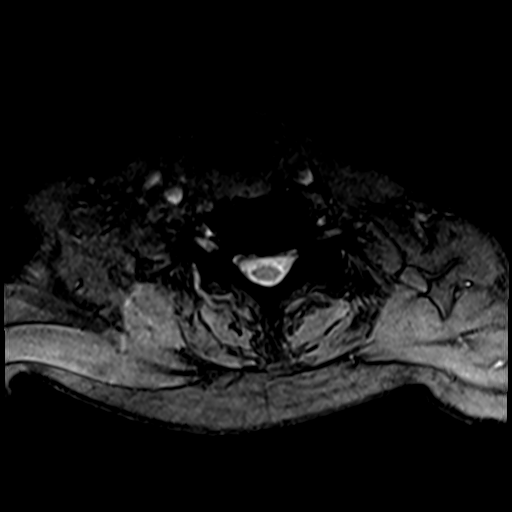
[im 17/40]
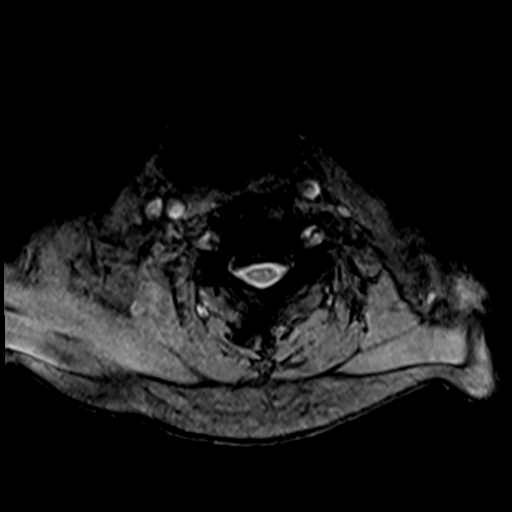
[im 23/40]
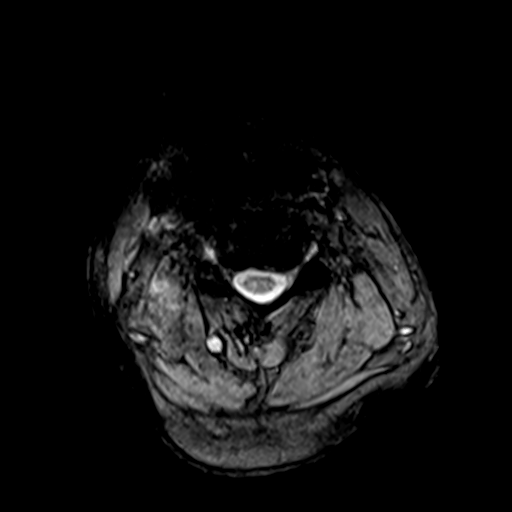
[im 28/40]
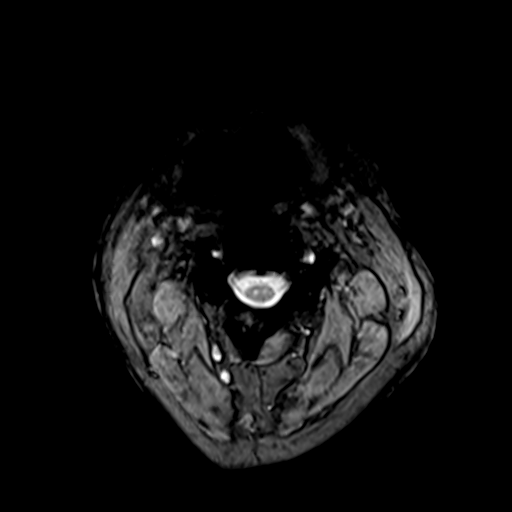
[im 34/40]
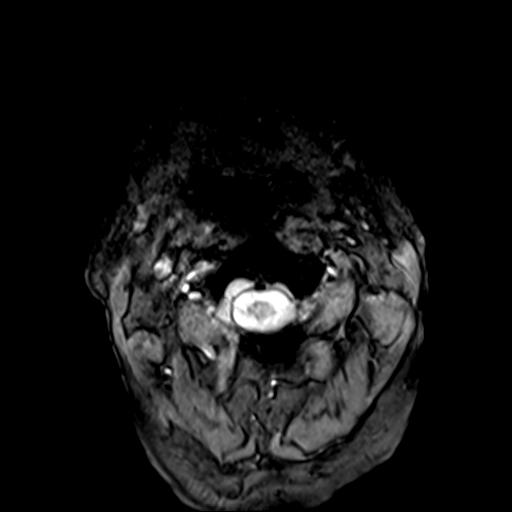
[im 40/40]
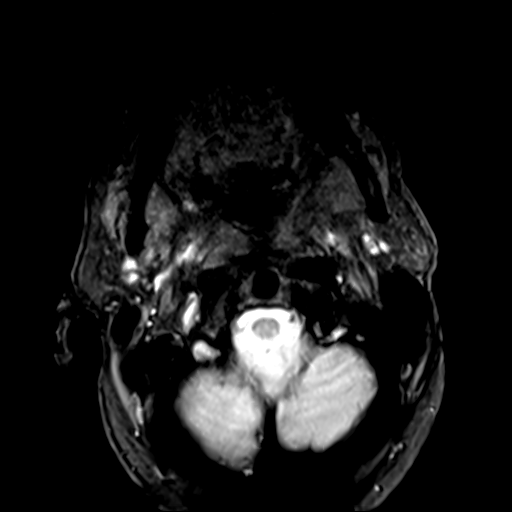

[37 of 48 positions shown; findings below may reference images not displayed]

FINDINGS: Alignment: Unchanged 2 mm anterolisthesis at C7-T1.

Vertebrae: No fracture, evidence of discitis, or bone lesion.

Cord: Normal signal and morphology.

Posterior Fossa, vertebral arteries, paraspinal tissues: Small
amount of prevertebral edema from C7-T3. Chronic microvascular
ischemic changes in the brainstem.

Disc levels:

C2-C3: Negative disc. Mild-to-moderate bilateral facet arthropathy.
No stenosis.

C3-C4: Small posterior disc osteophyte complex. Moderate bilateral
uncovertebral hypertrophy. Moderate right facet arthropathy.
Moderate to severe bilateral neuroforaminal stenosis. No spinal
canal stenosis.

C4-C5: Small posterior disc osteophyte complex. Moderate to severe
bilateral uncovertebral hypertrophy, worse on the left. Severe left
and moderate to severe right neuroforaminal stenosis. No spinal
canal stenosis.

C5-C6: Posterior disc osteophyte complex. Severe bilateral
uncovertebral hypertrophy, worse on the right. Mild spinal canal
stenosis. Severe bilateral neuroforaminal stenosis.

C6-C7: Posterior disc osteophyte complex. Severe bilateral
uncovertebral hypertrophy. Mild spinal canal stenosis. Severe
bilateral neuroforaminal stenosis.

C7-T1: Mild disc bulging. Mild bilateral uncovertebral hypertrophy.
Severe bilateral facet arthropathy. Moderate bilateral
neuroforaminal stenosis. No spinal canal stenosis.
IMPRESSION: 1. No acute fracture or traumatic malalignment of the cervical
spine.
2. Mild prevertebral edema from C7-T3, nonspecific.
3. Advanced multilevel cervical spondylosis as described above.
Multilevel severe neuroforaminal stenosis. No high-grade spinal
canal stenosis.

## 2023-08-10 IMAGING — CT CT ANGIO CHEST-ABD-PELV FOR DISSECTION W/ AND WO/W CM
2 of 7 series · 13 of 46 positions shown, 15 images · IV contrast (APPLIED)
Comparison: CTA chest/abdomen/pelvis 08/28/2018

CLINICAL DATA: Thoracic aortic aneurysm

EXAM:
CT ANGIOGRAPHY CHEST, ABDOMEN AND PELVIS
TECHNIQUE: Non-contrast CT of the chest was initially obtained.

[Series 7: arterial · axial · arterial · 0.70mm/px · z∈[+772,+1328]mm · 10 of 326 slices shown, 12 images]
[im 24/326  soft-tissue]
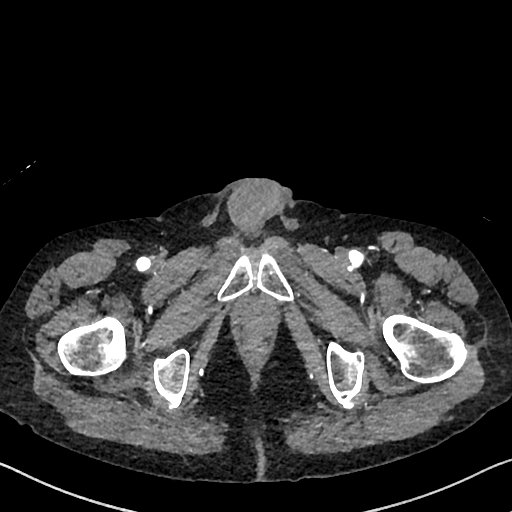
[im 24/326  bone]
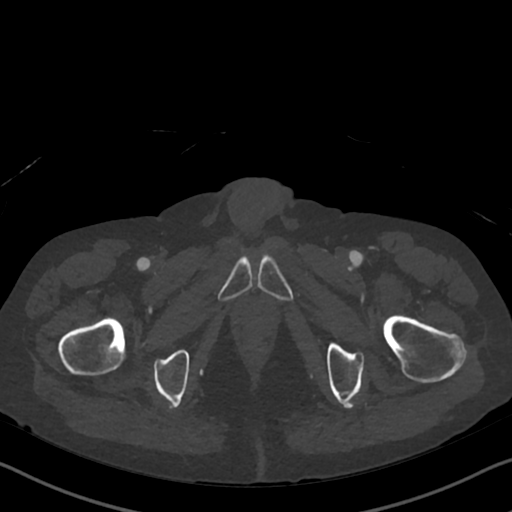
[im 47/326  soft-tissue]
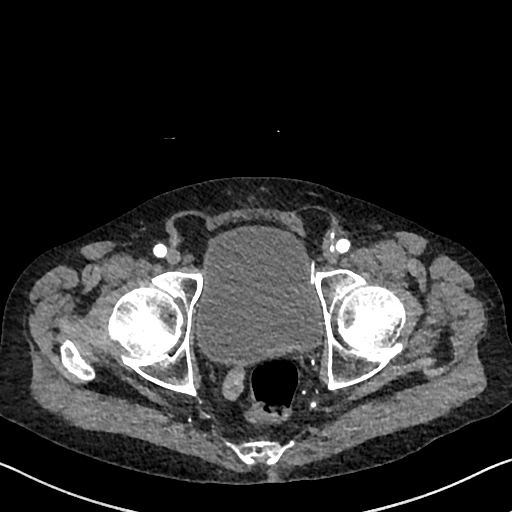
[im 93/326  soft-tissue]
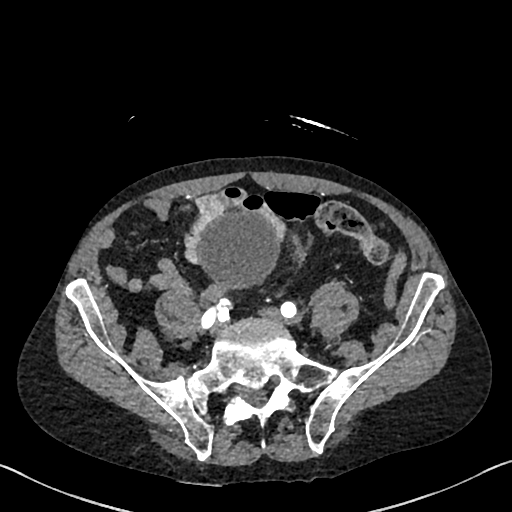
[im 117/326  soft-tissue]
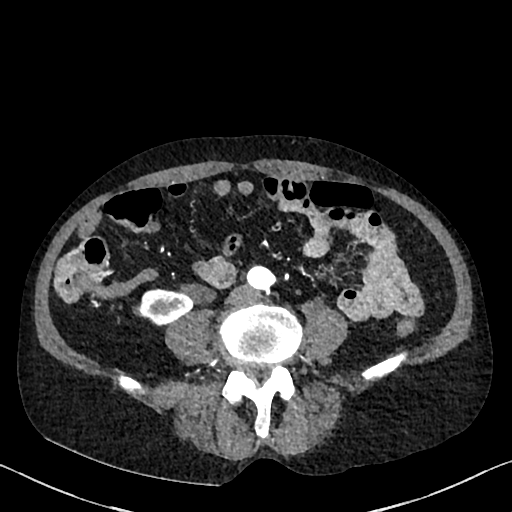
[im 140/326  soft-tissue]
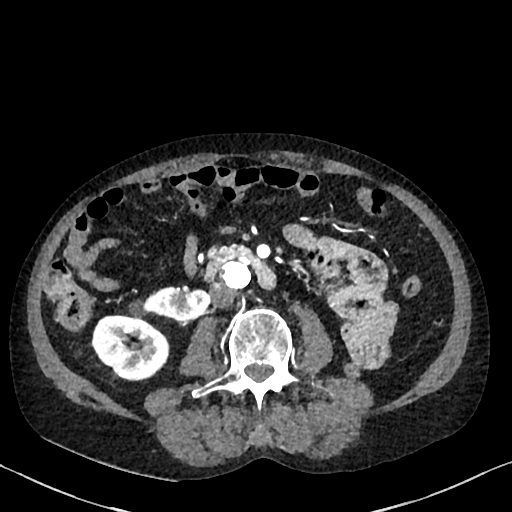
[im 186/326  soft-tissue]
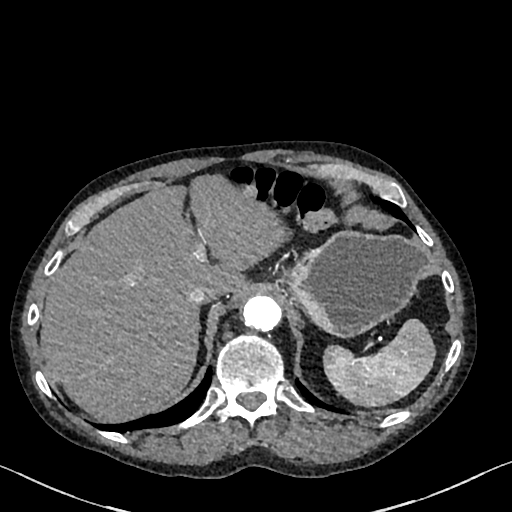
[im 209/326  soft-tissue]
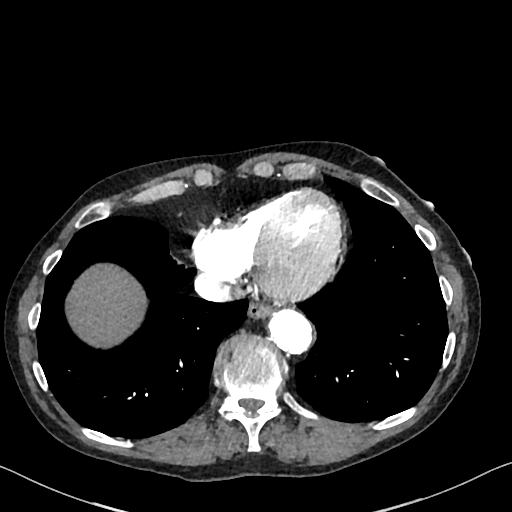
[im 233/326  soft-tissue]
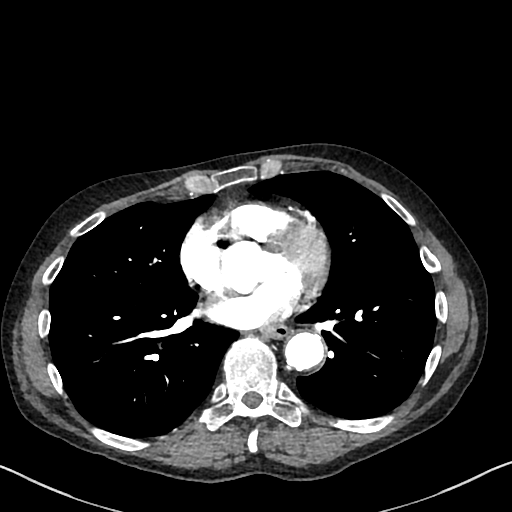
[im 279/326  soft-tissue]
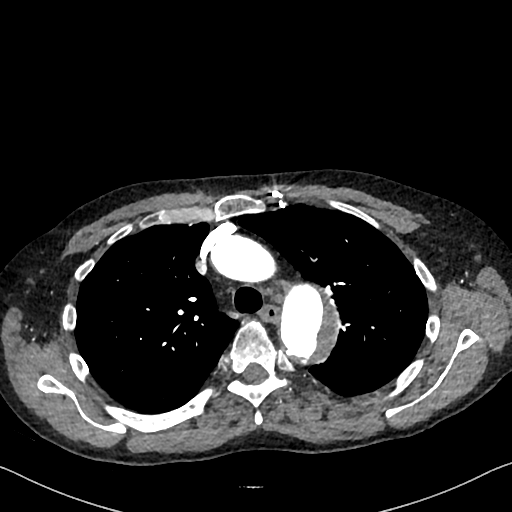
[im 279/326  bone]
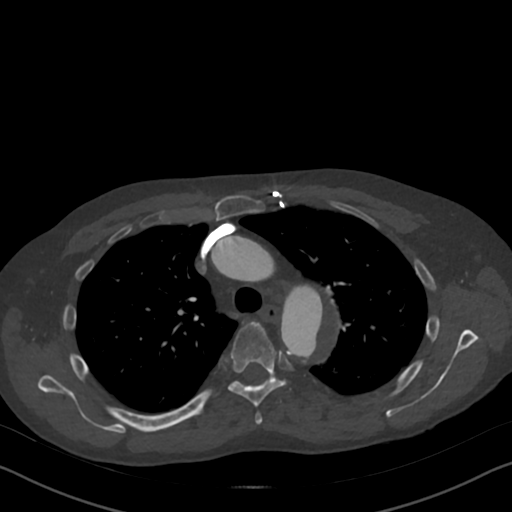
[im 302/326  soft-tissue]
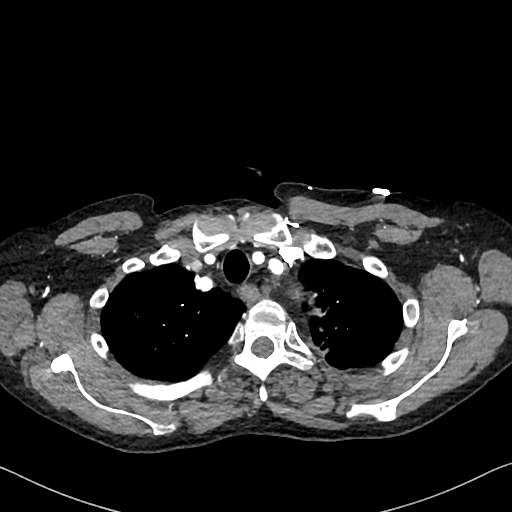

[Series 10: cor · coronal · 0.79mm/px · 3 of 145 slices shown]
[im 37/145  soft-tissue]
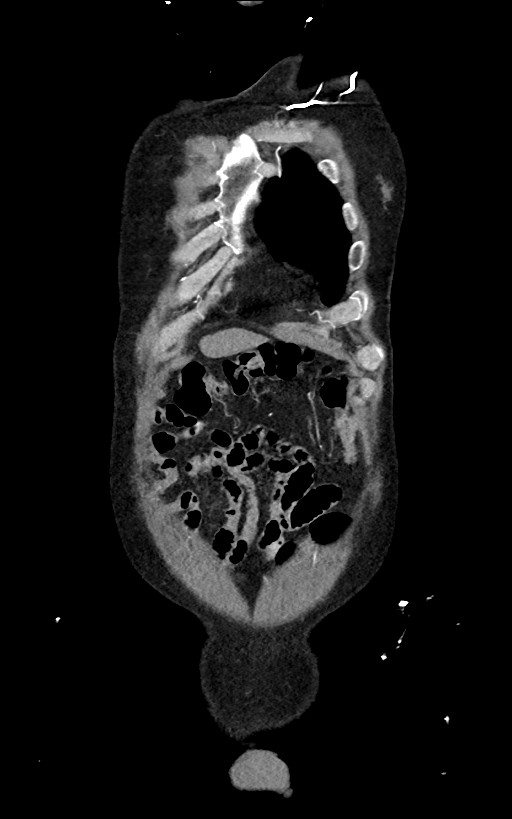
[im 73/145  soft-tissue]
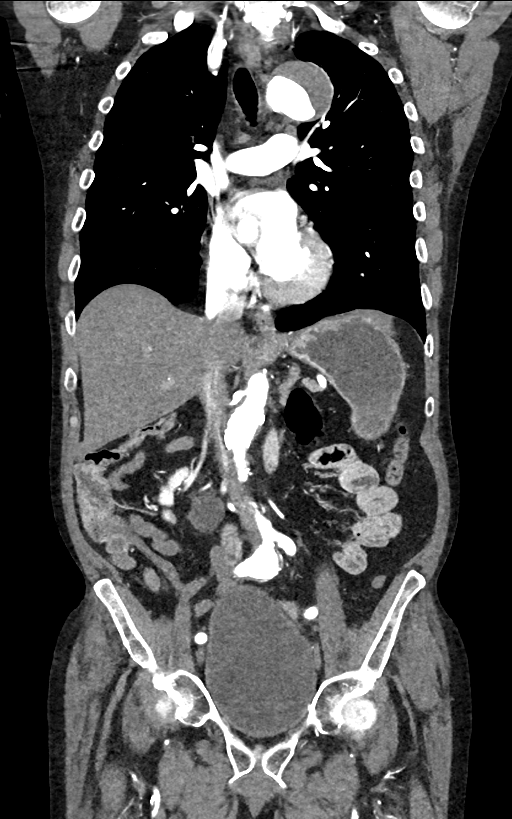
[im 109/145  soft-tissue]
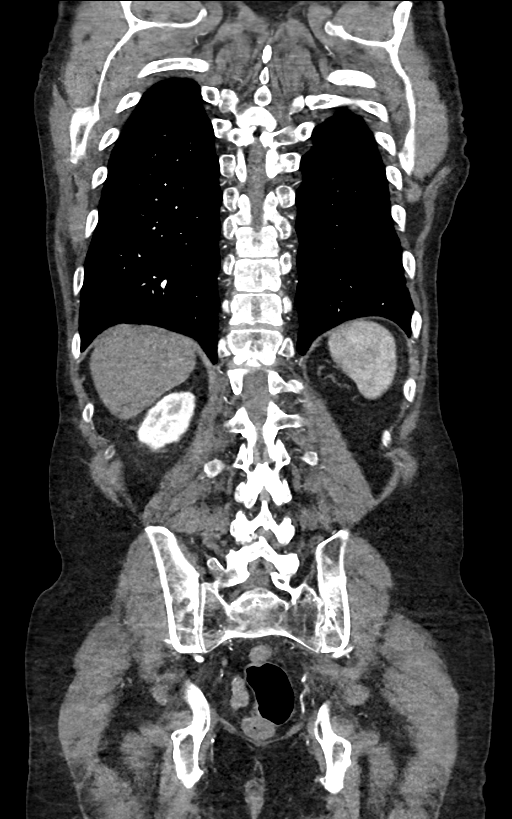

[13 of 46 positions shown; findings below may reference images not displayed]

Multidetector CT imaging through the chest, abdomen and pelvis was
performed using the standard protocol during bolus administration of
intravenous contrast. Multiplanar reconstructed images and MIPs were
obtained and reviewed to evaluate the vascular anatomy.

CONTRAST:  100mL OMNIPAQUE IOHEXOL 350 MG/ML SOLN
FINDINGS: CTA CHEST FINDINGS

Cardiovascular: Is aneurysm of the arch measuring up to 5.0 cm
(11-105, 10-81). This is increased from 3.4 cm on the prior study
from 7726 with associated increased mural thrombus. There is plaque
throughout the thoracic aorta from the origin of the left subclavian
artery through the midthoracic aorta. There is irregular plaque in
the midthoracic aorta likely reflecting contained penetrating
atherosclerotic ulcer (7-102). No dissection flap is identified.
There is no evidence of acute intramural hematoma on the noncontrast
images.

Mediastinum/Nodes: The thyroid is unremarkable. There is no
mediastinal, hilar, or axillary lymphadenopathy. Esophagus is
unremarkable.

Lungs/Pleura: The trachea and central airways are patent. There is
mild central bronchial wall thickening.

There is no focal consolidation or pulmonary edema. There is pleural
effusion or infiltrate there is scarring in the left lung apex

Musculoskeletal: There is no acute osseous abnormality.

Review of the MIP images confirms the above findings.

CTA ABDOMEN AND PELVIS FINDINGS

VASCULAR

Aorta: There is extensive soft and calcified atherosclerotic plaque
throughout. There is no abdominal aortic aneurysm.

Celiac: Patent with mild soft and calcified atherosclerotic plaque
at the origin. There is moderate severe focal stenosis of the
splenic artery (7-146), increased since 7726

SMA: The SMA and celiac artery share a common origin, unchanged. The
SMA is widely

Renals: Two right renal arteries are patent with mild soft and
calcified atherosclerotic plaque proximally.

IMA: Patent moderately stenotic at the origin.

Inflow: There is aneurysmal dilation of the right common iliac
artery measuring 2.5 cm, unchanged. There is cough and calcified
atherosclerotic plaque of the bilateral common iliac arteries and
bilateral internal iliac arteries without hemodynamically
significant stenosis.

Veins: No obvious venous abnormality within the limitations of this
arterial phase study.

Review of the MIP images confirms the above findings.

NON-VASCULAR

Hepatobiliary: The liver and gallbladder are unremarkable. There is
no biliary ductal dilatation.

Pancreas: Unremarkable.

Spleen: Unremarkable.

Adrenals/Urinary Tract: The adrenals are unremarkable. An ectopic
right kidney is again noted inferiorly to the orthotopic right
kidney with a prominent extrarenal pelvis. There are no suspicious
renal lesions there are no stones. There is a left bladder
diverticulum, unchanged. The bladder is otherwise unremarkable.

Stomach/Bowel: The stomach is unremarkable. There is no evidence of
bowel obstruction. There is no abnormal bowel wall thickening or
inflammatory change.

Lymphatic: There is no abdominal or pelvic lymphadenopathy.

Reproductive: A left seminal vesicle is not identified. The prostate
and right seminal vesicle are unremarkable

Other: There is no ascites or free air.

Musculoskeletal: There is no acute osseous abnormality

Review of the MIP images confirms the above findings.
IMPRESSION: 1. Increased fusiform aneurysmal dilation of the aortic arch
measuring up 5.0 cm in the arch, increased in size from 3.4 cm in
7726. There is associated increased extensive mural thrombus without
evidence of dissection flap or acute intramural hematoma. Recommend
semi-annual imaging followup by CTA or MRA and referral to
cardiothoracic surgery if not already obtained. This recommendation
follows 7808 ACCF/AHA/AATS/ACR/ASA/SCA/MEDFORD/MAGALLON/DREY/TAMANNA Guidelines
for the Diagnosis and Management of Patients With Thoracic Aortic
Disease. Circulation. 7808; 121: E266-e36. Aortic aneurysm NOS
([id number])
2. Focal moderate severe stenosis of the splenic artery, increased
since [DATE]. Unchanged right common iliac artery aneurysms measuring up to 2
point.

## 2023-09-29 ENCOUNTER — Emergency Department (HOSPITAL_COMMUNITY): Payer: Medicare Other

## 2023-09-29 ENCOUNTER — Encounter (HOSPITAL_COMMUNITY): Payer: Self-pay | Admitting: Emergency Medicine

## 2023-09-29 ENCOUNTER — Emergency Department (HOSPITAL_COMMUNITY)
Admission: EM | Admit: 2023-09-29 | Discharge: 2023-09-30 | Disposition: A | Discharge status: Home / Self Care | Payer: Medicare Other | Attending: Emergency Medicine | Admitting: Emergency Medicine

## 2023-09-29 ENCOUNTER — Other Ambulatory Visit: Payer: Self-pay

## 2023-09-29 DIAGNOSIS — I7122 Aneurysm of the aortic arch, without rupture: Secondary | ICD-10-CM | POA: Insufficient documentation

## 2023-09-29 DIAGNOSIS — I700 Atherosclerosis of aorta: Secondary | ICD-10-CM | POA: Insufficient documentation

## 2023-09-29 DIAGNOSIS — R42 Dizziness and giddiness: Secondary | ICD-10-CM | POA: Diagnosis not present

## 2023-09-29 DIAGNOSIS — E119 Type 2 diabetes mellitus without complications: Secondary | ICD-10-CM | POA: Insufficient documentation

## 2023-09-29 DIAGNOSIS — R0602 Shortness of breath: Secondary | ICD-10-CM | POA: Insufficient documentation

## 2023-09-29 DIAGNOSIS — J449 Chronic obstructive pulmonary disease, unspecified: Secondary | ICD-10-CM | POA: Insufficient documentation

## 2023-09-29 DIAGNOSIS — I2510 Atherosclerotic heart disease of native coronary artery without angina pectoris: Secondary | ICD-10-CM | POA: Diagnosis not present

## 2023-09-29 DIAGNOSIS — Z79899 Other long term (current) drug therapy: Secondary | ICD-10-CM | POA: Diagnosis not present

## 2023-09-29 DIAGNOSIS — I10 Essential (primary) hypertension: Secondary | ICD-10-CM | POA: Diagnosis not present

## 2023-09-29 DIAGNOSIS — Z5900 Homelessness unspecified: Secondary | ICD-10-CM | POA: Diagnosis not present

## 2023-09-29 DIAGNOSIS — R079 Chest pain, unspecified: Secondary | ICD-10-CM | POA: Insufficient documentation

## 2023-09-29 HISTORY — DX: Chronic obstructive pulmonary disease, unspecified: J44.9

## 2023-09-29 HISTORY — DX: Type 2 diabetes mellitus without complications: E11.9

## 2023-09-29 LAB — BASIC METABOLIC PANEL
Anion gap: 13 (ref 5–15)
BUN: 24 mg/dL — ABNORMAL HIGH (ref 8–23)
CO2: 25 mmol/L (ref 22–32)
Calcium: 10.1 mg/dL (ref 8.9–10.3)
Chloride: 98 mmol/L (ref 98–111)
Creatinine, Ser: 1.44 mg/dL — ABNORMAL HIGH (ref 0.61–1.24)
GFR, Estimated: 54 mL/min — ABNORMAL LOW (ref >60)
Glucose, Bld: 375 mg/dL — ABNORMAL HIGH (ref 70–99)
Potassium: 4.5 mmol/L (ref 3.5–5.1)
Sodium: 136 mmol/L (ref 135–145)

## 2023-09-29 LAB — TROPONIN I (HIGH SENSITIVITY): Troponin I (High Sensitivity): 20 ng/L — ABNORMAL HIGH (ref <18)

## 2023-09-29 LAB — CBC
HCT: 44 % (ref 39.0–52.0)
Hemoglobin: 14.6 g/dL (ref 13.0–17.0)
MCH: 30.5 pg (ref 26.0–34.0)
MCHC: 33.2 g/dL (ref 30.0–36.0)
MCV: 92.1 fL (ref 80.0–100.0)
Platelets: 196 10*3/uL (ref 150–400)
RBC: 4.78 MIL/uL (ref 4.22–5.81)
RDW: 13.3 % (ref 11.5–15.5)
WBC: 9.7 10*3/uL (ref 4.0–10.5)
nRBC: 0 % (ref 0.0–0.2)

## 2023-09-29 MED ORDER — ASPIRIN 81 MG PO CHEW
324.0000 mg | CHEWABLE_TABLET | Freq: Once | ORAL | Status: DC
  Filled 2023-09-29: qty 4

## 2023-09-29 NOTE — ED Triage Notes (Addendum)
Pt came in POV for CP/SOB.  Pt arrived back in town on train and was walking to where he would stay. Pt experienced dizziness on 2 occasions while walking so he stopped here.  Began experiencing with SOB while walking as well.  Pt is a diabetic and hasn't taken insulin in 2 days

## 2023-09-29 NOTE — ED Provider Triage Note (Signed)
Emergency Medicine Provider Triage Evaluation Note  [name] , a 67 y.o. male  was evaluated in triage.  Pt complains of chest pain and shortness of breath.  Patient was walking from the bus stop and was near [facility] in [place] when he had onset of chest pain and pressure.  He walked further onto the hospital states that his symptoms have improved.  He strong family history of heart attacks.  He is a heavy smoker history of high blood pressure.  He denies unilateral leg swelling..  Review of Systems  Positive: cp Negative: fever  Physical Exam  BP (!) 131/90 (BP Location: Right Arm)   Pulse (!) 121   Temp 97.7 F (36.5 C) (Oral)   Resp 18   SpO2 92%  Gen:   Awake, no distress  Resp:  Normal effort  MSK:   Moves extremities without difficulty  Other:    Medical Decision Making  Medically screening exam initiated at 6:05 PM.  Appropriate orders placed.  [name] was informed that the remainder of the evaluation will be completed by another provider, this initial triage assessment does not replace that evaluation, and the importance of remaining in the ED until their evaluation is complete.     Arthor Captain, PA-C 09/29/23 2153

## 2023-09-30 ENCOUNTER — Emergency Department (HOSPITAL_COMMUNITY): Payer: Medicare Other

## 2023-09-30 DIAGNOSIS — R079 Chest pain, unspecified: Secondary | ICD-10-CM | POA: Diagnosis not present

## 2023-09-30 LAB — TROPONIN I (HIGH SENSITIVITY): Troponin I (High Sensitivity): 17 ng/L (ref <18)

## 2023-09-30 LAB — CBG MONITORING, ED: Glucose-Capillary: 341 mg/dL — ABNORMAL HIGH (ref 70–99)

## 2023-09-30 LAB — BRAIN NATRIURETIC PEPTIDE: B Natriuretic Peptide: 48.1 pg/mL (ref 0.0–100.0)

## 2023-09-30 LAB — ETHANOL: Alcohol, Ethyl (B): <10 mg/dL (ref <10)

## 2023-09-30 LAB — MAGNESIUM: Magnesium: 1.9 mg/dL (ref 1.7–2.4)

## 2023-09-30 MED ORDER — IOHEXOL 350 MG/ML SOLN
100.0000 mL | Freq: Once | INTRAVENOUS | Status: AC | PRN
  Administered 2023-09-30: 100 mL via INTRAVENOUS

## 2023-09-30 MED ORDER — INSULIN ASPART 100 UNIT/ML IJ SOLN
5.0000 [IU] | Freq: Once | INTRAMUSCULAR | Status: AC
  Administered 2023-09-30: 5 [IU] via SUBCUTANEOUS

## 2023-09-30 MED ORDER — LACTATED RINGERS IV BOLUS
1000.0000 mL | Freq: Once | INTRAVENOUS | Status: AC
  Administered 2023-09-30: 1000 mL via INTRAVENOUS

## 2023-09-30 NOTE — ED Provider Notes (Signed)
Received handout from Dr. Durwin Nora.  Pending delta troponin.  Patient feeling well no further episodes of chest pain or shortness of breath. Physical Exam  BP (!) 160/106 (BP Location: Left Arm)   Pulse 100   Temp (!) 97 F (36.1 C) (Oral)   Resp 18   Ht 5\' 11"  (1.803 m)   Wt 59 kg   SpO2 97%   BMI 18.13 kg/m   Physical Exam Vitals reviewed.  HENT:     Head: Normocephalic.  Cardiovascular:     Rate and Rhythm: Normal rate and regular rhythm.  Pulmonary:     Effort: Pulmonary effort is normal.     Breath sounds: Normal breath sounds.  Skin:    General: Skin is warm and dry.     Capillary Refill: Capillary refill takes less than 2 seconds.  Neurological:     General: No focal deficit present.     Mental Status: He is alert.  Psychiatric:        Mood and Affect: Mood normal.        Behavior: Behavior normal.     Procedures  Procedures  ED Course / MDM    Medical Decision Making Is a 67 year old male presenting emergency department for chest pain.  Seen primarily by Dr. Durwin Nora this morning.  See his note for full HPI.  Signed out with delta troponin pending.  Troponin is negative.  Patient asymptomatic.  Appropriate for discharge.  Epic has been her stating that he has legal guardian, however patient stated that he has none.  No numbers on file.  TOC contacted, they are also unable to locate any record of legal guardianship.  Patient is alert and oriented x 3. Will discharge with cardiology follow-up.  Amount and/or Complexity of Data Reviewed Labs: ordered. Radiology: ordered.  Risk Prescription drug management.        Coral Spikes, DO 09/30/23 2215

## 2023-09-30 NOTE — ED Provider Notes (Signed)
[place] EMERGENCY DEPARTMENT AT [facility] Provider Note   CSN: [id number] Arrival date & time: 09/29/23  1738     History  No chief complaint on file.   [name] is a 67 y.o. male.  HPI Patient presents for chest pain and shortness of breath.  Medical history includes COPD, HTN, DM, aortic ulcer and mural thrombus, alcohol abuse.  Yesterday, patient was walking from the bus stop when he had onset of chest pain, shortness of breath, and dizziness.  Currently, he denies any symptoms.    Home Medications Prior to Admission medications   Medication Sig Start Date End Date Taking? Authorizing Provider  atorvastatin (LIPITOR) 20 MG tablet TAKE 1 TABLET BY MOUTH DAILY FOR CHOLESTEROL 08/15/21   Doreene Nest, NP      Allergies    Codeine phosphate    Review of Systems   Review of Systems  Respiratory:  Positive for shortness of breath.   Cardiovascular:  Positive for chest pain.  Neurological:  Positive for dizziness.  All other systems reviewed and are negative.   Physical Exam Updated Vital Signs BP (!) 169/86 (BP Location: Left Arm)   Pulse 87   Temp 97.8 F (36.6 C) (Oral)   Resp 18   Ht 5\' 11"  (1.803 m)   Wt 59 kg   SpO2 98%   BMI 18.13 kg/m  Physical Exam Vitals and nursing note reviewed.  Constitutional:      General: He is not in acute distress.    Appearance: Normal appearance. He is well-developed. He is not ill-appearing, toxic-appearing or diaphoretic.  HENT:     Head: Normocephalic and atraumatic.     Right Ear: External ear normal.     Left Ear: External ear normal.     Nose: Nose normal.     Mouth/Throat:     Mouth: Mucous membranes are moist.  Eyes:     Extraocular Movements: Extraocular movements intact.     Conjunctiva/sclera: Conjunctivae normal.  Cardiovascular:     Rate and Rhythm: Normal rate and regular rhythm.     Heart sounds: No murmur heard. Pulmonary:     Effort: Pulmonary effort is normal. No respiratory  distress.     Breath sounds: Normal breath sounds. No wheezing or rales.  Chest:     Chest wall: No tenderness.  Abdominal:     General: There is no distension.     Palpations: Abdomen is soft.     Tenderness: There is no abdominal tenderness.  Musculoskeletal:        General: No swelling. Normal range of motion.     Cervical back: Normal range of motion and neck supple.  Skin:    General: Skin is warm and dry.     Coloration: Skin is not jaundiced or pale.  Neurological:     General: No focal deficit present.     Mental Status: He is alert and oriented to person, place, and time.     Cranial Nerves: No cranial nerve deficit.     Sensory: No sensory deficit.     Motor: No weakness.     Coordination: Coordination normal.  Psychiatric:        Mood and Affect: Mood normal.        Behavior: Behavior normal.     ED Results / Procedures / Treatments   Labs (all labs ordered are listed, but only abnormal results are displayed) Labs Reviewed  BASIC METABOLIC PANEL - Abnormal; Notable for  the following components:      Result Value   Glucose, Bld 375 (*)    BUN 24 (*)    Creatinine, Ser 1.44 (*)    GFR, Estimated 54 (*)    All other components within normal limits  CBG MONITORING, ED - Abnormal; Notable for the following components:   Glucose-Capillary 341 (*)    All other components within normal limits  TROPONIN I (HIGH SENSITIVITY) - Abnormal; Notable for the following components:   Troponin I (High Sensitivity) 20 (*)    All other components within normal limits  CBC  BRAIN NATRIURETIC PEPTIDE  MAGNESIUM  ETHANOL  TROPONIN I (HIGH SENSITIVITY)    EKG EKG Interpretation Date/Time:  Monday September 29 2023 18:27:13 EST Ventricular Rate:  108 PR Interval:  154 QRS Duration:  90 QT Interval:  364 QTC Calculation: 487 R Axis:   74  Text Interpretation: Sinus tachycardia Right atrial enlargement Septal infarct , age undetermined T wave abnormality, consider inferior  ischemia Abnormal ECG Confirmed by Gloris Manchester ([id number]) on 09/30/2023 7:09:36 AM  Radiology CT Angio Chest/Abd/Pel for Dissection W and/or Wo Contrast  Result Date: 09/30/2023 CLINICAL DATA:  Dizziness and shortness of breath. History of aneurysmal disease of the aortic arch. EXAM: CT ANGIOGRAPHY CHEST, ABDOMEN AND PELVIS TECHNIQUE: Non-contrast CT of the chest was initially obtained. Multidetector CT imaging through the chest, abdomen and pelvis was performed using the standard protocol during bolus administration of intravenous contrast. Multiplanar reconstructed images and MIPs were obtained and reviewed to evaluate the vascular anatomy. RADIATION DOSE REDUCTION: This exam was performed according to the departmental dose-optimization program which includes automated exposure control, adjustment of the mA and/or kV according to patient size and/or use of iterative reconstruction technique. 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 Associated mural thrombus within the posterolateral aspect of the aneurysmal aorta. No evidence of acute dissection or aortic rupture. The ascending thoracic aorta and proximal arch are normal in caliber. Visualized proximal great vessels demonstrate normal patency. The mid to distal descending thoracic aorta demonstrates atherosclerosis and normal caliber of approximately 2.6 cm. The heart size is normal. Calcified coronary artery plaque present. Central pulmonary arteries are normal in caliber. No pericardial fluid identified. Mediastinum/Nodes: No enlarged mediastinal, hilar, or axillary lymph nodes. Thyroid gland, trachea, and esophagus demonstrate no significant findings. Lungs/Pleura: There is some airway thickening especially in the lower lobes bilaterally  and associated with some scattered areas of mucous plugging in both lower lobes, right greater than left. No focal airspace pneumonia, pulmonary edema, pleural fluid, pulmonary nodule or pneumothorax is identified. Musculoskeletal: No chest wall abnormality. No acute or significant osseous findings. Review of the MIP images confirms the above findings. CTA ABDOMEN AND PELVIS FINDINGS VASCULAR Aorta: Atherosclerosis of the abdominal aorta with mild focal aneurysmal bulge of the aorta just below the renal arteries and proximal to the IMA origin measuring up to 3.2 cm in maximum diameter. No dissection. Celiac: Normal variant celiacomesenteric trunk with normal patency. Normally patent celiac branch vessels. SMA: Normally patent celiacomesenteric trunk with normal patency of SMA branches. 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 IMA: Normally patent. Inflow: Mildly increased caliber of a proximal right common iliac artery aneurysm measuring up to 2.7 cm  in diameter compared to 2.5 cm previously. No evidence of rupture. Stable tortuosity of other iliac arteries without aneurysm or significant obstructive disease. Normally patent bilateral common femoral arteries and femoral bifurcations. Review of the MIP images confirms the above findings. NON-VASCULAR Hepatobiliary: No focal liver abnormality is seen. No gallstones, gallbladder wall thickening, or biliary dilatation. Pancreas: Unremarkable. No pancreatic ductal dilatation or surrounding inflammatory changes. Spleen: Normal in size without focal abnormality. Adrenals/Urinary Tract: Again noted is complete ectopia of the left kidney which is located inferior to the right kidney. The kidneys again do not appear to be fused. No hydronephrosis or solid renal masses. The bladder is unremarkable. Stomach/Bowel: No evidence of bowel obstruction, ileus, inflammation or free  intraperitoneal air. The appendix is difficult to discretely visualize. Lymphatic: No enlarged lymph nodes identified. Reproductive: Prostate is unremarkable. Other: No abdominal wall hernia or abnormality. No abdominopelvic ascites. Musculoskeletal: No acute or significant osseous findings. Review of the MIP images confirms the above findings. IMPRESSION: 1. 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 No evidence of acute dissection or aortic rupture. Recommend semi-annual imaging followup by CTA or MRA and referral to cardiothoracic surgery if not already obtained. This recommendation follows 2010 ACCF/AHA/AATS/ACR/ASA/SCA/SCAI/SIR/STS/SVM Guidelines for the Diagnosis and Management of Patients With Thoracic Aortic Disease. Circulation. 2010; 121: Z610-R60. Aortic aneurysm NOS (ICD10-I71.9) 2. Stable mild focal aneurysmal bulge of the abdominal aorta just below the renal arteries and proximal to the IMA origin measuring up to 3.2 cm in maximum diameter. Recommend follow-up every 3 years. This recommendation follows ACR consensus guidelines: White Paper of the ACR Incidental Findings Committee II on Vascular Findings. J Am Coll Radiol 2013; 10:789-794. 3. Mildly increased caliber of a proximal right common iliac artery aneurysm measuring up to 2.7 cm in diameter compared to 2.5 cm previously. No evidence of rupture. 4. Airway thickening especially in the lower lobes bilaterally and associated with some scattered areas of mucous plugging in both lower lobes, right greater than left. 5. Calcified coronary artery plaque. 6. 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 The kidneys again do not appear to be fused. Electronically Signed   By: Irish Lack M.D.   On: 09/30/2023 15:10   DG Chest 2 View  Result Date: 09/29/2023 CLINICAL DATA:  Shortness of breath. EXAM: CHEST  - 2 VIEW COMPARISON:  July 19, 2021 FINDINGS: The heart size is within normal limits. Stable enlargement of the aortic arch is noted with tortuosity of the descending thoracic aorta. There is no evidence of an acute infiltrate, pleural effusion or pneumothorax. A chronic fracture deformity of the distal right clavicle is noted. The visualized skeletal structures are otherwise unremarkable. IMPRESSION: 1. No active cardiopulmonary disease. 2. Stable enlargement of the aortic arch. Nonemergent CT correlation is recommended to determine stability of the patient's known aortic aneurysm. Electronically Signed   By: Aram Candela M.D.   On: 09/29/2023 21:27    Procedures Procedures    Medications Ordered in ED Medications  aspirin chewable tablet 324 mg (has no administration in time range)  lactated ringers bolus 1,000 mL (1,000 mLs Intravenous New Bag/Given 09/30/23 0858)  insulin aspart (novoLOG) injection 5 Units (5 Units Subcutaneous Given 09/30/23 1055)  iohexol (OMNIPAQUE) 350 MG/ML injection 100 mL (100 mLs Intravenous Contrast Given 09/30/23 1109)    ED Course/ Medical Decision Making/ A&P  Medical Decision Making Amount and/or Complexity of Data Reviewed Labs: ordered. Radiology: ordered.  Risk Prescription drug management.   This patient presents to the ED for concern of exertional chest pain, shortness of breath, dizziness, this involves an extensive number of treatment options, and is a complaint that carries with it a high risk of complications and morbidity.  The differential diagnosis includes ACS, reactive airway disease, URI, pneumonia, CHF, aortic disease, dehydration, anemia   Co morbidities that complicate the patient evaluation  COPD, HTN, DM, aortic ulcer and mural thrombus, alcohol abuse   Additional history obtained:  Additional history obtained from N/A External records from outside source obtained and reviewed including  EMR   Lab Tests:  I Ordered, and personally interpreted labs.  The pertinent results include: Creatinine slightly increased from lab work from a year ago.  Normal hemoglobin, no leukocytosis, normal BNP, normal troponin, hyperglycemia without evidence of DKA.   Imaging Studies ordered:  I ordered imaging studies including chest x-ray, CTA dissection study I independently visualized and interpreted imaging which showed CTA showed stable aneurysmal disease of distal aortic arch, infrarenal abdominal aorta, and mildly increased caliber of proximal right common iliac artery aneurysm.  There was bibasilar airway thickening without evidence of pneumonia. I agree with the radiologist interpretation   Cardiac Monitoring: / EKG:  The patient was maintained on a cardiac monitor.  I personally viewed and interpreted the cardiac monitored which showed an underlying rhythm of: Sinus rhythm  Problem List / ED Course / Critical interventions / Medication management  Patient presented for report of exertional chest pain and shortness of breath.  Workup, initiated prior to patient being bedded in the ED, notable for hyperglycemia, creatinine elevated from baseline, and troponin of 20.  Troponin does seem to be in line with prior lab work.  EKG shows stable ST segment abnormalities, consistent with prior tracings.  On assessment, patient is sleeping.  He is easily awakened.  When asked what brought him to the emergency department, he does remember walking to his cousin's house but does not remember his symptoms.  He currently denies any symptoms.  Per chart review, he does have history of aortic ulcer and renal thrombus.  CTA dissection study was ordered.  Patient remained asymptomatic while in the ED.  CTA showed stable aortic aneurysmal disease.  Patient would benefit from follow-up with cardiology and cardiothoracic surgery.  Repeat troponin is pending at time of signout.  Care of patient was signed out to  oncoming ED provider. I ordered medication including IVF and insulin for hyperglycemia Reevaluation of the patient after these medicines showed that the patient improved I have reviewed the patients home medicines and have made adjustments as needed   Social Determinants of Health:  History of homelessness        Final Clinical Impression(s) / ED Diagnoses Final diagnoses:  Chest pain, unspecified type    Rx / DC Orders ED Discharge Orders          Ordered    Ambulatory referral to Cardiology       Comments: If you have not heard from the Cardiology office within the next 72 hours please call [phone].   09/30/23 3151              Gloris Manchester, MD 09/30/23 1524

## 2023-09-30 NOTE — ED Notes (Signed)
PT has a legal guardian with no number on file.

## 2023-09-30 NOTE — Discharge Instructions (Addendum)
Test results today are reassuring.  You have areas of your aorta that are enlarged.  These are called aneurysms.  These should be monitored every 6 months.  You should establish care with cardiothoracic surgery for monitoring of these.  Call the telephone number below to establish care and schedule an outpatient follow-up appointment.  You should also follow-up with cardiology.  If you do not hear from the cardiology office in the next couple days, call the other telephone number below.  Return to the emergency department for any new or worsening symptoms of concern.

## 2023-10-01 ENCOUNTER — Emergency Department (HOSPITAL_COMMUNITY)
Admission: EM | Admit: 2023-10-01 | Discharge: 2023-10-01 | Disposition: A | Discharge status: Home / Self Care | Payer: Medicare Other | Attending: Emergency Medicine | Admitting: Emergency Medicine

## 2023-10-01 ENCOUNTER — Other Ambulatory Visit: Payer: Self-pay

## 2023-10-01 ENCOUNTER — Other Ambulatory Visit (HOSPITAL_COMMUNITY): Payer: Self-pay

## 2023-10-01 ENCOUNTER — Encounter (HOSPITAL_COMMUNITY): Payer: Self-pay

## 2023-10-01 DIAGNOSIS — J449 Chronic obstructive pulmonary disease, unspecified: Secondary | ICD-10-CM | POA: Insufficient documentation

## 2023-10-01 DIAGNOSIS — I10 Essential (primary) hypertension: Secondary | ICD-10-CM | POA: Insufficient documentation

## 2023-10-01 DIAGNOSIS — R739 Hyperglycemia, unspecified: Secondary | ICD-10-CM

## 2023-10-01 DIAGNOSIS — Z7984 Long term (current) use of oral hypoglycemic drugs: Secondary | ICD-10-CM | POA: Insufficient documentation

## 2023-10-01 DIAGNOSIS — E1165 Type 2 diabetes mellitus with hyperglycemia: Secondary | ICD-10-CM | POA: Insufficient documentation

## 2023-10-01 DIAGNOSIS — Z79899 Other long term (current) drug therapy: Secondary | ICD-10-CM | POA: Insufficient documentation

## 2023-10-01 DIAGNOSIS — I719 Aortic aneurysm of unspecified site, without rupture: Secondary | ICD-10-CM

## 2023-10-01 LAB — CBC WITH DIFFERENTIAL/PLATELET
Abs Immature Granulocytes: 0.04 10*3/uL (ref 0.00–0.07)
Basophils Absolute: 0.1 10*3/uL (ref 0.0–0.1)
Basophils Relative: 1 %
Eosinophils Absolute: 0.3 10*3/uL (ref 0.0–0.5)
Eosinophils Relative: 4 %
HCT: 37.2 % — ABNORMAL LOW (ref 39.0–52.0)
Hemoglobin: 12.6 g/dL — ABNORMAL LOW (ref 13.0–17.0)
Immature Granulocytes: 1 %
Lymphocytes Relative: 22 %
Lymphs Abs: 1.9 10*3/uL (ref 0.7–4.0)
MCH: 31 pg (ref 26.0–34.0)
MCHC: 33.9 g/dL (ref 30.0–36.0)
MCV: 91.6 fL (ref 80.0–100.0)
Monocytes Absolute: 0.8 10*3/uL (ref 0.1–1.0)
Monocytes Relative: 9 %
Neutro Abs: 5.6 10*3/uL (ref 1.7–7.7)
Neutrophils Relative %: 63 %
Platelets: 165 10*3/uL (ref 150–400)
RBC: 4.06 MIL/uL — ABNORMAL LOW (ref 4.22–5.81)
RDW: 13.4 % (ref 11.5–15.5)
WBC: 8.7 10*3/uL (ref 4.0–10.5)
nRBC: 0 % (ref 0.0–0.2)

## 2023-10-01 LAB — BASIC METABOLIC PANEL
Anion gap: 9 (ref 5–15)
BUN: 22 mg/dL (ref 8–23)
CO2: 23 mmol/L (ref 22–32)
Calcium: 9.1 mg/dL (ref 8.9–10.3)
Chloride: 99 mmol/L (ref 98–111)
Creatinine, Ser: 1.18 mg/dL (ref 0.61–1.24)
GFR, Estimated: >60 mL/min (ref >60)
Glucose, Bld: 460 mg/dL — ABNORMAL HIGH (ref 70–99)
Potassium: 3.5 mmol/L (ref 3.5–5.1)
Sodium: 131 mmol/L — ABNORMAL LOW (ref 135–145)

## 2023-10-01 LAB — CBG MONITORING, ED
Glucose-Capillary: 468 mg/dL — ABNORMAL HIGH (ref 70–99)
Glucose-Capillary: 387 mg/dL — ABNORMAL HIGH (ref 70–99)
Glucose-Capillary: 147 mg/dL — ABNORMAL HIGH (ref 70–99)

## 2023-10-01 LAB — TROPONIN I (HIGH SENSITIVITY)
Troponin I (High Sensitivity): 30 ng/L — ABNORMAL HIGH (ref <18)
Troponin I (High Sensitivity): 18 ng/L — ABNORMAL HIGH (ref <18)

## 2023-10-01 MED ORDER — CARVEDILOL 6.25 MG PO TABS
6.2500 mg | ORAL_TABLET | Freq: Every day | ORAL | 0 refills | Status: AC
  Filled 2023-10-01: qty 30, 30d supply, fill #0

## 2023-10-01 MED ORDER — AMLODIPINE BESYLATE 10 MG PO TABS
10.0000 mg | ORAL_TABLET | Freq: Every day | ORAL | 0 refills | Status: DC
  Filled 2023-10-01: qty 30, 30d supply, fill #0

## 2023-10-01 MED ORDER — SODIUM CHLORIDE 0.9 % IV BOLUS
1000.0000 mL | Freq: Once | INTRAVENOUS | Status: AC
  Administered 2023-10-01: 1000 mL via INTRAVENOUS

## 2023-10-01 MED ORDER — ATORVASTATIN CALCIUM 20 MG PO TABS
ORAL_TABLET | ORAL | 0 refills | Status: DC
  Filled 2023-10-01: qty 90, 90d supply, fill #0

## 2023-10-01 MED ORDER — INSULIN ASPART 100 UNIT/ML IJ SOLN
5.0000 [IU] | Freq: Once | INTRAMUSCULAR | Status: AC
  Administered 2023-10-01: 5 [IU] via SUBCUTANEOUS

## 2023-10-01 MED ORDER — BD PEN NEEDLE NANO U/F 32G X 4 MM MISC
100.0000 | 0 refills | Status: DC
  Filled 2023-10-01: qty 100, 30d supply, fill #0

## 2023-10-01 MED ORDER — BASAGLAR KWIKPEN 100 UNIT/ML ~~LOC~~ SOPN
16.0000 [IU] | PEN_INJECTOR | Freq: Every day | SUBCUTANEOUS | 0 refills | Status: AC
  Filled 2023-10-01: qty 15, 93d supply, fill #0

## 2023-10-01 NOTE — Discharge Planning (Signed)
RNCM consulted regarding medication assistance.  Pt is insured and therefore will not qualify for medication assistance.  RNCM suggest sending Rx to [facility] pharmacy to be filled and delivered to him prior to discharge.

## 2023-10-01 NOTE — Inpatient Diabetes Management (Signed)
Inpatient Diabetes Program Recommendations  AACE/ADA: New Consensus Statement on Inpatient Glycemic Control (2015)  Target Ranges:  Prepandial:   less than 140 mg/dL      Peak postprandial:   less than 180 mg/dL (1-2 hours)      Critically ill patients:  140 - 180 mg/dL   Lab Results  Component Value Date   GLUCAP 147 (H) 10/01/2023   HGBA1C 5.5 08/25/2018    Spoke with pt at bedside regarding insulin. Pt cannot tell me the name or doses of insulins he is taking. I do not think he is very compliant at baseline. Pt reports not having a home. I cannot get a clear answer on how long he has been without insulin. Pt says he does not have a meter to check his glucose.  Pt to get new prescriptions for his medications. Pt will need follow up.  Thanks,  Christena Deem RN, MSN, BC-ADM Inpatient Diabetes Coordinator Team Pager [phone] (8a-5p)

## 2023-10-01 NOTE — ED Notes (Addendum)
BS was checked at 0800 and it was 147. BS did not cross over.

## 2023-10-01 NOTE — Discharge Instructions (Signed)
Your workup today did not show evidence of diabetic ketoacidosis but did show elevated glucoses.  He was able to come back down with medications and fluids.  Please restart your home medications that you were out of and follow-up with your primary doctor.  Please rest.  If any symptoms change or worsen acutely, please return to the nearest emergency department.

## 2023-10-01 NOTE — ED Triage Notes (Signed)
Just discharged from ED. Says he has been waiting on a ride and suspected his glucose was high since he has not had any insulin in 2-3 days.

## 2023-10-01 NOTE — ED Provider Notes (Signed)
[place] EMERGENCY DEPARTMENT AT [facility] Provider Note   CSN: [id number] Arrival date & time: 10/01/23  0158     History  Chief Complaint  Patient presents with   Hyperglycemia    [name] is a 67 y.o. male.  The history is provided by the patient and medical records.  Hyperglycemia [name] is a 67 y.o. male who presents to the Emergency Department complaining of feeling unwell.  He presents to the emergency department after recently being discharged for chest pain evaluation.  While waiting for his ride he checked back in for what he describes as a sudden feeling of being unwell and he thought his sugar was high.  He has been off of his insulin since Sunday.  His episode of chest pain he describes occurred when he was leaving the bus station and walking towards the emergency department.  It was a brief episode that resolved when he rested.  No recurrent chest pain since November 18.    Home Medications Prior to Admission medications   Medication Sig Start Date End Date Taking? Authorizing Provider  amLODipine (NORVASC) 10 MG tablet Take 1 tablet by mouth daily. 09/02/22   [provider]  atorvastatin (LIPITOR) 20 MG tablet TAKE 1 TABLET BY MOUTH DAILY FOR CHOLESTEROL Patient taking differently: Take 40 mg by mouth at bedtime. 08/15/21   Doreene Nest, NP  carvedilol (COREG) 6.25 MG tablet Take 6.25 mg by mouth 2 (two) times daily with a meal. 09/02/22   [provider]  Insulin Glargine Solostar (LANTUS) 100 UNIT/ML Solostar Pen Inject 8 Units into the skin at bedtime. 09/02/22   [provider]  insulin lispro (HUMALOG) 100 UNIT/ML KwikPen Inject 3-6 Units into the skin See admin instructions. Inject 3 units subcutaneously with breakfast, then 6 units subcutaneously with lunch, and then 5 units subcutaneously with dinner. 09/02/22   [provider]      Allergies    Codeine phosphate    Review of Systems    Review of Systems  All other systems reviewed and are negative.   Physical Exam Updated Vital Signs BP (!) 143/86   Pulse 64   Temp 97.6 F (36.4 C) (Oral)   Resp 15   Ht 5\' 11"  (1.803 m)   Wt 59 kg   SpO2 100%   BMI 18.13 kg/m  Physical Exam Vitals and nursing note reviewed.  Constitutional:      Appearance: He is well-developed.  HENT:     Head: Normocephalic and atraumatic.  Cardiovascular:     Rate and Rhythm: Normal rate and regular rhythm.     Heart sounds: No murmur heard. Pulmonary:     Effort: Pulmonary effort is normal. No respiratory distress.     Breath sounds: Normal breath sounds.  Abdominal:     Palpations: Abdomen is soft.     Tenderness: There is no abdominal tenderness. There is no guarding or rebound.  Musculoskeletal:        General: No swelling or tenderness.  Skin:    General: Skin is warm and dry.  Neurological:     Mental Status: He is alert and oriented to person, place, and time.  Psychiatric:        Behavior: Behavior normal.     ED Results / Procedures / Treatments   Labs (all labs ordered are listed, but only abnormal results are displayed) Labs Reviewed  BASIC METABOLIC PANEL - Abnormal; Notable for the following components:  Result Value   Sodium 131 (*)    Glucose, Bld 460 (*)    All other components within normal limits  CBC WITH DIFFERENTIAL/PLATELET - Abnormal; Notable for the following components:   RBC 4.06 (*)    Hemoglobin 12.6 (*)    HCT 37.2 (*)    All other components within normal limits  CBG MONITORING, ED - Abnormal; Notable for the following components:   Glucose-Capillary 468 (*)    All other components within normal limits  CBG MONITORING, ED - Abnormal; Notable for the following components:   Glucose-Capillary 387 (*)    All other components within normal limits  TROPONIN I (HIGH SENSITIVITY) - Abnormal; Notable for the following components:   Troponin I (High Sensitivity) 30 (*)    All other  components within normal limits          All other components within normal limits  CBG MONITORING, ED    EKG EKG Interpretation Date/Time:  Wednesday October 01 2023 05:09:54 EST Ventricular Rate:  64 PR Interval:  174 QRS Duration:  90 QT Interval:  389 QTC Calculation: 402 R Axis:   67  Text Interpretation: Sinus rhythm Probable left atrial enlargement Anterior infarct, old Artifact in lead(s) I II aVR aVL aVF Confirmed by Tilden Fossa [id number] on 10/01/2023 5:45:45 AM  Radiology    Procedures Procedures    Medications Ordered in ED Medications  sodium chloride 0.9 % bolus 1,000 mL (0 mLs Intravenous Stopped 10/01/23 0549)  insulin aspart (novoLOG) injection 5 Units (5 Units Subcutaneous Given 10/01/23 6010)    ED Course/ Medical Decision Making/ A&P                                 Medical Decision Making Amount and/or Complexity of Data Reviewed Labs: ordered.  Risk Prescription drug management.   Patient with history of diabetes, COPD, hypertension here for evaluation of feeling unwell.  He just had an ED evaluation for chest pain with troponins, CTA.  He has not had any recurrent chest pain.  His blood sugar was found to be elevated in the 400s.  He has been off of his insulin.  There is no evidence of DKA.  Given his recent evaluation for chest pain a troponin was ordered.  Initial troponin is mildly elevated.  Plan to check a second troponin as he is pain-free.  He was treated with IV fluids for hyperglycemia as well as subcu insulin.  Patient care transferred pending repeat troponin, sure improving glucose. Case management consult placed regarding assistance obtaining medications.        Final Clinical Impression(s) / ED Diagnoses Final diagnoses:  None    Rx / DC Orders ED Discharge Orders     None         Tilden Fossa, MD 10/01/23 [phone]

## 2023-10-01 NOTE — ED Provider Notes (Signed)
7:42 AM Care assumed from Dr. Madilyn Hook.  At time of transfer of care, patient is awaiting for delta troponin due to chest discomfort and not feeling well yesterday.  That is reassuring, anticipate plan for discharge.  However, as he is homeless and has no medications, and is new in the area, previous team consulted CSW to get involved to see what assistance patient may need.  8:00 AM Second troponin is improving.  Will wait for CSW evaluation and plan for discharge.  1:13 PM Pharmacy tech was able to confirm medications and I spoke with pharmacist to help to order the correct outpatient medications.  They will be sent to the transition of care pharmacy so we can get them.  Will discharge per previous plan for outpatient follow-up.  Patient agrees and will be discharged.  Clinical Impression: 1. Hyperglycemia   2. Penetrating atherosclerotic ulcer of aorta (HCC)   3. Essential hypertension     Disposition: Discharge  Condition: Good  I have discussed the results, Dx and Tx plan with the pt(& family if present). He/she/they expressed understanding and agree(s) with the plan. Discharge instructions discussed at great length. Strict return precautions discussed and pt &/or family have verbalized understanding of the instructions. No further questions at time of discharge.    Current Discharge Medication List     START taking these medications   Details  Insulin Pen Needle (BD PEN NEEDLE NANO U/F) 32G X 4 MM MISC 100 each by Does not apply route as directed. To use with glargine pen. Substitutions as needed are allowed. Qty: 100 each, Refills: 0        Follow Up: Center, [facility] [address] [place] [address] [phone]     [facility] Emergency Department at [facility] [address] [place] [address] [phone]    [facility] [address] [place] [address] [place]  [address] [phone] Schedule an appointment as soon as possible for a visit       Haidyn Kilburg, Canary Brim, MD 10/01/23 1315

## 2023-10-01 NOTE — ED Notes (Signed)
RN gave pt a taxi voucher.

## 2023-10-25 ENCOUNTER — Inpatient Hospital Stay (HOSPITAL_COMMUNITY): Payer: Medicare Other

## 2023-10-25 ENCOUNTER — Ambulatory Visit
Admission: EM | Admit: 2023-10-25 | Discharge: 2023-10-25 | Payer: Medicare Other | Attending: Family Medicine | Admitting: Family Medicine

## 2023-10-25 ENCOUNTER — Emergency Department (HOSPITAL_COMMUNITY): Payer: Medicare Other

## 2023-10-25 ENCOUNTER — Encounter (HOSPITAL_COMMUNITY): Payer: Self-pay | Admitting: Internal Medicine

## 2023-10-25 ENCOUNTER — Inpatient Hospital Stay (HOSPITAL_COMMUNITY)
Admission: EM | Admit: 2023-10-25 | Discharge: 2023-11-02 | DRG: 871 | Disposition: A | Discharge status: Hospice | Payer: Medicare Other | Attending: Internal Medicine | Admitting: Internal Medicine

## 2023-10-25 DIAGNOSIS — N19 Unspecified kidney failure: Secondary | ICD-10-CM | POA: Diagnosis not present

## 2023-10-25 DIAGNOSIS — R4182 Altered mental status, unspecified: Principal | ICD-10-CM

## 2023-10-25 DIAGNOSIS — R1312 Dysphagia, oropharyngeal phase: Secondary | ICD-10-CM | POA: Diagnosis present

## 2023-10-25 DIAGNOSIS — E1165 Type 2 diabetes mellitus with hyperglycemia: Secondary | ICD-10-CM | POA: Diagnosis present

## 2023-10-25 DIAGNOSIS — R7989 Other specified abnormal findings of blood chemistry: Secondary | ICD-10-CM | POA: Diagnosis not present

## 2023-10-25 DIAGNOSIS — Z681 Body mass index (BMI) 19 or less, adult: Secondary | ICD-10-CM

## 2023-10-25 DIAGNOSIS — N3000 Acute cystitis without hematuria: Secondary | ICD-10-CM

## 2023-10-25 DIAGNOSIS — I110 Hypertensive heart disease with heart failure: Secondary | ICD-10-CM | POA: Diagnosis present

## 2023-10-25 DIAGNOSIS — Z515 Encounter for palliative care: Secondary | ICD-10-CM | POA: Diagnosis not present

## 2023-10-25 DIAGNOSIS — A419 Sepsis, unspecified organism: Principal | ICD-10-CM

## 2023-10-25 DIAGNOSIS — Z794 Long term (current) use of insulin: Secondary | ICD-10-CM | POA: Diagnosis not present

## 2023-10-25 DIAGNOSIS — R6520 Severe sepsis without septic shock: Secondary | ICD-10-CM

## 2023-10-25 DIAGNOSIS — I959 Hypotension, unspecified: Secondary | ICD-10-CM | POA: Diagnosis not present

## 2023-10-25 DIAGNOSIS — Z5986 Financial insecurity: Secondary | ICD-10-CM

## 2023-10-25 DIAGNOSIS — I7122 Aneurysm of the aortic arch, without rupture: Secondary | ICD-10-CM | POA: Diagnosis present

## 2023-10-25 DIAGNOSIS — E860 Dehydration: Secondary | ICD-10-CM | POA: Diagnosis not present

## 2023-10-25 DIAGNOSIS — G9341 Metabolic encephalopathy: Secondary | ICD-10-CM

## 2023-10-25 DIAGNOSIS — Z5900 Homelessness unspecified: Secondary | ICD-10-CM | POA: Diagnosis not present

## 2023-10-25 DIAGNOSIS — F17210 Nicotine dependence, cigarettes, uncomplicated: Secondary | ICD-10-CM | POA: Diagnosis present

## 2023-10-25 DIAGNOSIS — E876 Hypokalemia: Secondary | ICD-10-CM | POA: Diagnosis present

## 2023-10-25 DIAGNOSIS — Z66 Do not resuscitate: Secondary | ICD-10-CM | POA: Diagnosis present

## 2023-10-25 DIAGNOSIS — R578 Other shock: Secondary | ICD-10-CM | POA: Diagnosis not present

## 2023-10-25 DIAGNOSIS — Z7189 Other specified counseling: Secondary | ICD-10-CM | POA: Diagnosis not present

## 2023-10-25 DIAGNOSIS — E870 Hyperosmolality and hypernatremia: Secondary | ICD-10-CM | POA: Diagnosis present

## 2023-10-25 DIAGNOSIS — N179 Acute kidney failure, unspecified: Secondary | ICD-10-CM | POA: Diagnosis present

## 2023-10-25 DIAGNOSIS — Z79899 Other long term (current) drug therapy: Secondary | ICD-10-CM

## 2023-10-25 DIAGNOSIS — J9601 Acute respiratory failure with hypoxia: Secondary | ICD-10-CM | POA: Diagnosis not present

## 2023-10-25 DIAGNOSIS — R739 Hyperglycemia, unspecified: Secondary | ICD-10-CM | POA: Diagnosis present

## 2023-10-25 DIAGNOSIS — E43 Unspecified severe protein-calorie malnutrition: Secondary | ICD-10-CM | POA: Diagnosis present

## 2023-10-25 DIAGNOSIS — F1010 Alcohol abuse, uncomplicated: Secondary | ICD-10-CM | POA: Diagnosis present

## 2023-10-25 DIAGNOSIS — J440 Chronic obstructive pulmonary disease with acute lower respiratory infection: Secondary | ICD-10-CM | POA: Diagnosis present

## 2023-10-25 DIAGNOSIS — I5032 Chronic diastolic (congestive) heart failure: Secondary | ICD-10-CM | POA: Diagnosis present

## 2023-10-25 DIAGNOSIS — R64 Cachexia: Secondary | ICD-10-CM | POA: Diagnosis present

## 2023-10-25 DIAGNOSIS — I10 Essential (primary) hypertension: Secondary | ICD-10-CM | POA: Diagnosis not present

## 2023-10-25 DIAGNOSIS — E785 Hyperlipidemia, unspecified: Secondary | ICD-10-CM | POA: Diagnosis present

## 2023-10-25 DIAGNOSIS — E1100 Type 2 diabetes mellitus with hyperosmolarity without nonketotic hyperglycemic-hyperosmolar coma (NKHHC): Secondary | ICD-10-CM | POA: Diagnosis present

## 2023-10-25 DIAGNOSIS — J189 Pneumonia, unspecified organism: Secondary | ICD-10-CM | POA: Diagnosis present

## 2023-10-25 DIAGNOSIS — Z885 Allergy status to narcotic agent status: Secondary | ICD-10-CM

## 2023-10-25 DIAGNOSIS — R6521 Severe sepsis with septic shock: Secondary | ICD-10-CM | POA: Diagnosis present

## 2023-10-25 DIAGNOSIS — E872 Acidosis, unspecified: Secondary | ICD-10-CM | POA: Diagnosis present

## 2023-10-25 DIAGNOSIS — J690 Pneumonitis due to inhalation of food and vomit: Secondary | ICD-10-CM | POA: Diagnosis present

## 2023-10-25 DIAGNOSIS — D751 Secondary polycythemia: Secondary | ICD-10-CM | POA: Diagnosis present

## 2023-10-25 DIAGNOSIS — K219 Gastro-esophageal reflux disease without esophagitis: Secondary | ICD-10-CM | POA: Diagnosis present

## 2023-10-25 DIAGNOSIS — R609 Edema, unspecified: Secondary | ICD-10-CM | POA: Diagnosis not present

## 2023-10-25 DIAGNOSIS — R54 Age-related physical debility: Secondary | ICD-10-CM | POA: Diagnosis present

## 2023-10-25 DIAGNOSIS — R627 Adult failure to thrive: Secondary | ICD-10-CM | POA: Diagnosis present

## 2023-10-25 DIAGNOSIS — R410 Disorientation, unspecified: Secondary | ICD-10-CM | POA: Diagnosis present

## 2023-10-25 DIAGNOSIS — F419 Anxiety disorder, unspecified: Secondary | ICD-10-CM | POA: Diagnosis present

## 2023-10-25 LAB — BASIC METABOLIC PANEL
Anion gap: 18 — ABNORMAL HIGH (ref 5–15)
BUN: 61 mg/dL — ABNORMAL HIGH (ref 8–23)
CO2: 23 mmol/L (ref 22–32)
Calcium: 9.8 mg/dL (ref 8.9–10.3)
Chloride: 102 mmol/L (ref 98–111)
Creatinine, Ser: 2.77 mg/dL — ABNORMAL HIGH (ref 0.61–1.24)
GFR, Estimated: 25 mL/min — ABNORMAL LOW (ref >60)
Glucose, Bld: 800 mg/dL (ref 70–99)
Potassium: 4.5 mmol/L (ref 3.5–5.1)
Sodium: 143 mmol/L (ref 135–145)

## 2023-10-25 LAB — CBC WITH DIFFERENTIAL/PLATELET
Abs Immature Granulocytes: 0.16 10*3/uL — ABNORMAL HIGH (ref 0.00–0.07)
Basophils Absolute: 0.1 10*3/uL (ref 0.0–0.1)
Basophils Relative: 1 %
Eosinophils Absolute: 0 10*3/uL (ref 0.0–0.5)
Eosinophils Relative: 0 %
HCT: 52.6 % — ABNORMAL HIGH (ref 39.0–52.0)
Hemoglobin: 17.1 g/dL — ABNORMAL HIGH (ref 13.0–17.0)
Immature Granulocytes: 1 %
Lymphocytes Relative: 8 %
Lymphs Abs: 1.6 10*3/uL (ref 0.7–4.0)
MCH: 30.6 pg (ref 26.0–34.0)
MCHC: 32.5 g/dL (ref 30.0–36.0)
MCV: 94.1 fL (ref 80.0–100.0)
Monocytes Absolute: 1.6 10*3/uL — ABNORMAL HIGH (ref 0.1–1.0)
Monocytes Relative: 8 %
Neutro Abs: 17.3 10*3/uL — ABNORMAL HIGH (ref 1.7–7.7)
Neutrophils Relative %: 82 %
Platelets: 235 10*3/uL (ref 150–400)
RBC: 5.59 MIL/uL (ref 4.22–5.81)
RDW: 13.4 % (ref 11.5–15.5)
WBC: 20.7 10*3/uL — ABNORMAL HIGH (ref 4.0–10.5)
nRBC: 0 % (ref 0.0–0.2)

## 2023-10-25 LAB — CBG MONITORING, ED
Glucose-Capillary: 484 mg/dL — ABNORMAL HIGH (ref 70–99)
Glucose-Capillary: 529 mg/dL (ref 70–99)
Glucose-Capillary: >600 mg/dL (ref 70–99)
Glucose-Capillary: >600 mg/dL (ref 70–99)
Glucose-Capillary: >600 mg/dL (ref 70–99)
Glucose-Capillary: >600 mg/dL (ref 70–99)

## 2023-10-25 LAB — URINALYSIS, ROUTINE W REFLEX MICROSCOPIC
Bilirubin Urine: NEGATIVE
Glucose, UA: >=500 mg/dL — AB
Ketones, ur: NEGATIVE mg/dL
Nitrite: NEGATIVE
Protein, ur: NEGATIVE mg/dL
Specific Gravity, Urine: 1.025 (ref 1.005–1.030)
pH: 5 (ref 5.0–8.0)

## 2023-10-25 LAB — I-STAT VENOUS BLOOD GAS, ED
Acid-Base Excess: 2 mmol/L (ref 0.0–2.0)
Bicarbonate: 25.3 mmol/L (ref 20.0–28.0)
Calcium, Ion: 1.01 mmol/L — ABNORMAL LOW (ref 1.15–1.40)
HCT: 52 % (ref 39.0–52.0)
Hemoglobin: 17.7 g/dL — ABNORMAL HIGH (ref 13.0–17.0)
O2 Saturation: 86 %
Potassium: 6.1 mmol/L — ABNORMAL HIGH (ref 3.5–5.1)
Sodium: 137 mmol/L (ref 135–145)
TCO2: 26 mmol/L (ref 22–32)
pCO2, Ven: 36.6 mm[Hg] — ABNORMAL LOW (ref 44–60)
pH, Ven: 7.448 — ABNORMAL HIGH (ref 7.25–7.43)
pO2, Ven: 48 mm[Hg] — ABNORMAL HIGH (ref 32–45)

## 2023-10-25 LAB — POCT FASTING CBG KUC MANUAL ENTRY: POCT Glucose (KUC): 600 mg/dL — AB (ref 70–99)

## 2023-10-25 LAB — I-STAT CHEM 8, ED
BUN: 80 mg/dL — ABNORMAL HIGH (ref 8–23)
Calcium, Ion: 0.95 mmol/L — ABNORMAL LOW (ref 1.15–1.40)
Chloride: 105 mmol/L (ref 98–111)
Creatinine, Ser: 2.9 mg/dL — ABNORMAL HIGH (ref 0.61–1.24)
Glucose, Bld: >700 mg/dL (ref 70–99)
HCT: 54 % — ABNORMAL HIGH (ref 39.0–52.0)
Hemoglobin: 18.4 g/dL — ABNORMAL HIGH (ref 13.0–17.0)
Potassium: 6.2 mmol/L — ABNORMAL HIGH (ref 3.5–5.1)
Sodium: 138 mmol/L (ref 135–145)
TCO2: 23 mmol/L (ref 22–32)

## 2023-10-25 LAB — RAPID URINE DRUG SCREEN, HOSP PERFORMED
Amphetamines: NOT DETECTED
Barbiturates: NOT DETECTED
Benzodiazepines: NOT DETECTED
Cocaine: NOT DETECTED
Opiates: NOT DETECTED
Tetrahydrocannabinol: NOT DETECTED

## 2023-10-25 LAB — I-STAT CG4 LACTIC ACID, ED
Lactic Acid, Venous: 3.9 mmol/L (ref 0.5–1.9)
Lactic Acid, Venous: 3.6 mmol/L (ref 0.5–1.9)

## 2023-10-25 LAB — TROPONIN I (HIGH SENSITIVITY): Troponin I (High Sensitivity): 45 ng/L — ABNORMAL HIGH (ref <18)

## 2023-10-25 MED ORDER — KCL IN DEXTROSE-NACL 10-5-0.45 MEQ/L-%-% IV SOLN
INTRAVENOUS | Status: DC
  Filled 2023-10-25: qty 1000

## 2023-10-25 MED ORDER — DEXTROSE IN LACTATED RINGERS 5 % IV SOLN: INTRAVENOUS | Status: DC

## 2023-10-25 MED ORDER — ONDANSETRON HCL 4 MG/2ML IJ SOLN: 4.0000 mg | Freq: Four times a day (QID) | INTRAMUSCULAR | Status: DC | PRN

## 2023-10-25 MED ORDER — CARVEDILOL 6.25 MG PO TABS
6.2500 mg | ORAL_TABLET | Freq: Every day | ORAL | Status: DC
  Administered 2023-10-26: 6.25 mg via ORAL
  Filled 2023-10-25: qty 1

## 2023-10-25 MED ORDER — INSULIN REGULAR(HUMAN) IN NACL 100-0.9 UT/100ML-% IV SOLN
INTRAVENOUS | Status: DC
  Administered 2023-10-25: 7 [IU]/h via INTRAVENOUS
  Filled 2023-10-25: qty 100

## 2023-10-25 MED ORDER — LACTATED RINGERS IV BOLUS (SEPSIS): 500.0000 mL | Freq: Once | INTRAVENOUS | Status: DC

## 2023-10-25 MED ORDER — ACETAMINOPHEN 650 MG RE SUPP
650.0000 mg | Freq: Four times a day (QID) | RECTAL | Status: DC | PRN
Start: 2023-10-25 — End: 2023-10-26

## 2023-10-25 MED ORDER — LACTATED RINGERS IV SOLN: INTRAVENOUS | Status: DC

## 2023-10-25 MED ORDER — LACTATED RINGERS IV BOLUS
1000.0000 mL | Freq: Once | INTRAVENOUS | Status: AC
  Administered 2023-10-25: 1000 mL via INTRAVENOUS

## 2023-10-25 MED ORDER — LACTATED RINGERS IV BOLUS
20.0000 mL/kg | Freq: Once | INTRAVENOUS | Status: AC
  Administered 2023-10-25: 1134 mL via INTRAVENOUS

## 2023-10-25 MED ORDER — ACETAMINOPHEN 325 MG PO TABS: 650.0000 mg | ORAL_TABLET | Freq: Four times a day (QID) | ORAL | Status: DC | PRN

## 2023-10-25 MED ORDER — DEXTROSE 50 % IV SOLN
0.0000 mL | INTRAVENOUS | Status: DC | PRN
  Filled 2023-10-25: qty 50

## 2023-10-25 MED ORDER — POTASSIUM CHLORIDE 2 MEQ/ML IV SOLN
INTRAVENOUS | Status: DC
  Filled 2023-10-25: qty 1000

## 2023-10-25 MED ORDER — MELATONIN 3 MG PO TABS: 3.0000 mg | ORAL_TABLET | Freq: Every evening | ORAL | Status: DC | PRN

## 2023-10-25 MED ORDER — SODIUM CHLORIDE 0.9 % IV SOLN
2.0000 g | INTRAVENOUS | Status: DC
  Administered 2023-10-25: 2 g via INTRAVENOUS
  Filled 2023-10-25: qty 20

## 2023-10-25 MED ORDER — POTASSIUM CHLORIDE 10 MEQ/100ML IV SOLN
10.0000 meq | INTRAVENOUS | Status: AC
  Administered 2023-10-25 (×2): 10 meq via INTRAVENOUS
  Filled 2023-10-25 (×2): qty 100

## 2023-10-25 MED ORDER — ONDANSETRON HCL 4 MG/2ML IJ SOLN
4.0000 mg | Freq: Once | INTRAMUSCULAR | Status: AC
  Administered 2023-10-25: 4 mg via INTRAVENOUS
  Filled 2023-10-25: qty 2

## 2023-10-25 MED ORDER — ONDANSETRON HCL 4 MG/2ML IJ SOLN
INTRAMUSCULAR | Status: AC
  Filled 2023-10-25: qty 2

## 2023-10-25 NOTE — ED Notes (Signed)
Patient is being discharged from the Urgent Care and sent to the Emergency Department via EMS . Per Dr. Tracie Harrier, patient is in need of higher level of care due to hyperglycemia, hypotension. Patient is aware and verbalizes understanding of plan of care.  Vitals:   10/25/23 1532  BP: 93/67  Pulse: 76  Resp: 18  Temp: 98.4 F (36.9 C)  SpO2: (!) 89%

## 2023-10-25 NOTE — ED Provider Notes (Signed)
[facility] EMERGENCY DEPARTMENT AT [facility] Provider Note   CSN: [id number] Arrival date & time: 10/25/23  1631     History  Chief Complaint  Patient presents with   Blood Sugar Problem    [name] is a 67 y.o. male.  HPI Patient is a very poor historian.  He is confused.  He has had diarrhea as soon as getting back to a room in the emergency department.  He cannot give me any details as to whether he has been having diarrhea for quite a while.  He just says "I need shitty paper".  Review of EMR indicates that patient is a type II diabetic and homeless with history of compliance problems.  Patient went to urgent care with complaint of cough, chest pain, fatigue, headache, hypotension and loss of appetite for a week.  He was found to be significantly hyper glycemic and confused and transferred to the emergency department.    Home Medications Prior to Admission medications   Medication Sig Start Date End Date Taking? Authorizing Provider  amLODipine (NORVASC) 10 MG tablet Take 1 tablet (10 mg total) by mouth daily. 10/01/23 10/31/23  Tegeler, Canary Brim, MD  atorvastatin (LIPITOR) 20 MG tablet TAKE 1 TABLET BY MOUTH DAILY FOR CHOLESTEROL 10/01/23   Tegeler, Canary Brim, MD  carvedilol (COREG) 6.25 MG tablet Take 1 tablet (6.25 mg total) by mouth daily. 10/01/23 10/31/23  Tegeler, Canary Brim, MD  Insulin Glargine [facility] KWIKPEN) 100 UNIT/ML Inject 16 Units into the skin at bedtime. 10/01/23 01/02/24  Tegeler, Canary Brim, MD  Insulin Pen Needle (BD PEN NEEDLE NANO U/F) 32G X 4 MM MISC To use with glargine pen. Substitutions as needed are allowed. 10/01/23 10/31/23  Tegeler, Canary Brim, MD      Allergies    Codeine phosphate    Review of Systems   Review of Systems  Physical Exam Updated Vital Signs BP (!) 154/98   Pulse 74   Temp 98.4 F (36.9 C) (Oral)   Resp 18   Ht 5\' 11"  (1.803 m)   Wt 56.7 kg   SpO2 100%   BMI 17.43 kg/m  Physical  Exam Constitutional:      Comments: Patient is cachectic.  He is confused.  No respiratory distress at rest.  Occasional wet cough.  HENT:     Mouth/Throat:     Comments: Mucous membranes are extremely dry with very poor dentition and coated in orange thick material. Eyes:     Extraocular Movements: Extraocular movements intact.     Pupils: Pupils are equal, round, and reactive to light.  Cardiovascular:     Comments: Tachycardia.  Regular no appreciable rub murmur gallop. Pulmonary:     Comments: No respiratory distress.  Patient has occasional wet cough.  Lungs are grossly clear. Abdominal:     Comments: Abdomen is flat.  Nondistended.  Very thin.  No localizing tenderness.  Musculoskeletal:     Cervical back: Neck supple.     Comments: Extremities are extremely thin with muscular atrophy.  No appreciable active wounds.  No peripheral edema.  Skin:    General: Skin is warm and dry.     Coloration: Skin is pale.  Neurological:     Comments: Patient is a confused.  He appears to have delirium.  He will respond to some questions and make comments but does not appear situationally oriented.  No focal motor deficits.  He was able to respond to command to sit forward to  listen the lungs.  He did use both upper extremities to grasp the hand rails and bring himself forward.  He is also using both lower extremities to try to shift down in the stretcher.  He has mumbling speech.     ED Results / Procedures / Treatments   Labs (all labs ordered are listed, but only abnormal results are displayed) Labs Reviewed  CBC WITH DIFFERENTIAL/PLATELET - Abnormal; Notable for the following components:      Result Value   WBC 20.7 (*)    Hemoglobin 17.1 (*)    HCT 52.6 (*)    Neutro Abs 17.3 (*)    Monocytes Absolute 1.6 (*)    Abs Immature Granulocytes 0.16 (*)    All other components within normal limits  URINALYSIS, ROUTINE W REFLEX MICROSCOPIC - Abnormal; Notable for the following components:    APPearance CLOUDY (*)    Glucose, UA >=500 (*)    Hgb urine dipstick SMALL (*)    Leukocytes,Ua LARGE (*)    Bacteria, UA MANY (*)    All other components within normal limits  BASIC METABOLIC PANEL - Abnormal; Notable for the following components:   Glucose, Bld 800 (*)    BUN 61 (*)    Creatinine, Ser 2.77 (*)    GFR, Estimated 25 (*)    Anion gap 18 (*)    All other components within normal limits  CBG MONITORING, ED - Abnormal; Notable for the following components:   Glucose-Capillary >600 (*)    All other components within normal limits  CBG MONITORING, ED - Abnormal; Notable for the following components:   Glucose-Capillary >600 (*)    All other components within normal limits  I-STAT CHEM 8, ED - Abnormal; Notable for the following components:   Potassium 6.2 (*)    BUN 80 (*)    Creatinine, Ser 2.90 (*)    Glucose, Bld >700 (*)    Calcium, Ion 0.95 (*)    Hemoglobin 18.4 (*)    HCT 54.0 (*)    All other components within normal limits  I-STAT CG4 LACTIC ACID, ED - Abnormal; Notable for the following components:   Lactic Acid, Venous 3.9 (*)    All other components within normal limits  I-STAT VENOUS BLOOD GAS, ED - Abnormal; Notable for the following components:   pH, Ven 7.448 (*)    pCO2, Ven 36.6 (*)    pO2, Ven 48 (*)    Potassium 6.1 (*)    Calcium, Ion 1.01 (*)    Hemoglobin 17.7 (*)    All other components within normal limits  I-STAT CG4 LACTIC ACID, ED - Abnormal; Notable for the following components:   Lactic Acid, Venous 3.6 (*)    All other components within normal limits  CBG MONITORING, ED - Abnormal; Notable for the following components:   Glucose-Capillary >600 (*)    All other components within normal limits  TROPONIN I (HIGH SENSITIVITY) - Abnormal; Notable for the following components:   Troponin I (High Sensitivity) 45 (*)    All other components within normal limits  CULTURE, BLOOD (ROUTINE X 2)  CULTURE, BLOOD (ROUTINE X 2)  RAPID  URINE DRUG SCREEN, HOSP PERFORMED  BASIC METABOLIC PANEL  BASIC METABOLIC PANEL  BASIC METABOLIC PANEL  BETA-HYDROXYBUTYRIC ACID  BETA-HYDROXYBUTYRIC ACID  ETHANOL  OSMOLALITY  BASIC METABOLIC PANEL  BETA-HYDROXYBUTYRIC ACID  TROPONIN I (HIGH SENSITIVITY)  TROPONIN I (HIGH SENSITIVITY)    EKG EKG Interpretation Date/Time:  Saturday October 25 2023  16:33:29 EST Ventricular Rate:  117 PR Interval:  154 QRS Duration:  88 QT Interval:  310 QTC Calculation: 433 R Axis:   54  Text Interpretation: Sinus tachycardia Right atrial enlargement Probable LVH with secondary repol abnrm Anterior Q waves, possibly due to LVH diffuse ST depression and T wave inversion. more pronounced than on older tracings Confirmed by Arby Barrette [phone]) on 10/25/2023 5:22:20 PM  Radiology DG Chest Portable 1 View Result Date: 10/25/2023 CLINICAL DATA:  Cough, COPD. EXAM: PORTABLE CHEST 1 VIEW COMPARISON:  09/29/2023. FINDINGS: Normal cardiac silhouette. Massively dilated aortic arch. Lungs are clear. No pneumothorax or pleural effusion. Osseous structures are grossly intact. IMPRESSION: Massively dilated aortic arch. No definite interval change radiographically, but CT would be more definitive. Electronically Signed   By: Layla Maw M.D.   On: 10/25/2023 18:53    Procedures Procedures   CRITICAL CARE Performed by: Arby Barrette   Total critical care time: 60 minutes  Critical care time was exclusive of separately billable procedures and treating other patients.  Critical care was necessary to treat or prevent imminent or life-threatening deterioration.  Critical care was time spent personally by me on the following activities: development of treatment plan with patient and/or surrogate as well as nursing, discussions with consultants, evaluation of patient's response to treatment, examination of patient, obtaining history from patient or surrogate, ordering and performing treatments and  interventions, ordering and review of laboratory studies, ordering and review of radiographic studies, pulse oximetry and re-evaluation of patient's condition.  Medications Ordered in ED Medications  insulin regular, human (MYXREDLIN) 100 units/ 100 mL infusion (7 Units/hr Intravenous New Bag/Given 10/25/23 2129)  dextrose 50 % solution 0-50 mL (has no administration in time range)  potassium chloride 10 mEq in 100 mL IVPB (10 mEq Intravenous New Bag/Given 10/25/23 2133)  lactated ringers infusion (has no administration in time range)  dextrose 5 % in lactated ringers infusion (has no administration in time range)  ondansetron (ZOFRAN) injection 4 mg (has no administration in time range)  ondansetron (ZOFRAN) 4 MG/2ML injection (has no administration in time range)  lactated ringers infusion (has no administration in time range)  lactated ringers bolus 500 mL (has no administration in time range)  cefTRIAXone (ROCEPHIN) 2 g in sodium chloride 0.9 % 100 mL IVPB (has no administration in time range)  lactated ringers bolus 1,134 mL (0 mLs Intravenous Stopped 10/25/23 2022)  lactated ringers bolus 1,000 mL (1,000 mLs Intravenous New Bag/Given 10/25/23 2100)    ED Course/ Medical Decision Making/ A&P                                 Medical Decision Making Amount and/or Complexity of Data Reviewed Labs: ordered. Radiology: ordered.  Risk Prescription drug management. Decision regarding hospitalization.  Patient presents as outlined.  Blood sugar read greater than 600 by EMS.  Patient has known history of diabetes and difficulty with compliance and homelessness.  At this time patient appears severely dehydrated and has delirium.  Differential diagnosis includes hyperosmolar hyperglycemic coma\DKA\severe dehydration with electrolyte derangement\sepsis.  Will proceed with broad diagnostic evaluation.  Patient is tachycardic.  Temperature 98.4 no fever at this time.  No obvious source of  infection.  Will initiate rehydration with lactic Ringer's and proceed with septic workup as well.  At this time with stable blood pressure and no fever, will not start any empiric antibiotics until further diagnostic evaluation.  Initial  lactic acid 3.6 venous i-STAT pH 7.4 pCO2 36 initial blood sugar greater than 700 ethanol negative drug screen negative urinalysis large leukocytes 21-50 WBC many bacteria 0-5 squamous epithelial cells.  White blood cell count 20 H&H 17 and 52 sodium 151 BUN 49 creatinine 2.3  At this time patient presents with multifactorial condition including uncontrolled diabetes with hyperglycemia and acute kidney injury.  Analysis is grossly positive.  Septic workup has been initiated with fluid resuscitation.  Will treat for UTI.  With severely elevated blood sugar and mental status change, most likely hyperosmolar hyperglycemic state with mental status change will require admission.  However patient's blood pressures remained stable.  Airway remains stable.  Patient did have significant cough and some oxygen desaturation requiring supplemental oxygen.  However with adequate positioning and pulmonary toilet, patient does maintain oxygen saturation in the mid to upper 90s.  At this time still appropriate for hospitalist admission.  Will plan for hospitalist admission.  Consult: Reviewed with Dr. Dalene Carrow [facility] hospitalist for admission.       Final Clinical Impression(s) / ED Diagnoses Final diagnoses:  Altered mental status, unspecified altered mental status type  Hyperosmolar hyperglycemic state (HHS) (HCC)  Sepsis, due to unspecified organism, unspecified whether acute organ dysfunction present [facility])    Rx / DC Orders ED Discharge Orders     None         Arby Barrette, MD 11/03/23 1945

## 2023-10-25 NOTE — Progress Notes (Signed)
Pt being followed by [facility] for Sepsis protocol. 

## 2023-10-25 NOTE — Discharge Instructions (Signed)
BP 93/67 (BP Location: Right Arm)   Pulse 76   Temp 98.4 F (36.9 C) (Temporal)   Resp 18   SpO2 (!) 89%   Labs Reviewed  POCT FASTING CBG [facility] MANUAL ENTRY - Abnormal; Notable for the following components:      Result Value   POCT Glucose ([facility]) 600 (*)    All other components within normal limits

## 2023-10-25 NOTE — ED Triage Notes (Signed)
Pt present cough, chest pain, fatigue, headache, hypotension, loss of appetite x 1 week.

## 2023-10-25 NOTE — ED Triage Notes (Signed)
BIB [facility] from urgent care Patients sugar level at urgent care was in the 600's, with Ems it was 391, Patient brought in due to AMS and possible DKA.

## 2023-10-25 NOTE — H&P (Incomplete)
History and Physical      [name] ZOX:[id number] DOB: Apr 15, 1956 DOA: 10/25/2023; DOS: 10/25/2023  PCP: [facility]  Patient coming from: home   I have personally briefly reviewed patient's old medical records in [facility]  Chief Complaint: altered mental status  HPI: [name] is a 67 y.o. male with medical history significant for type 2 diabetes mellitus, insulin-dependent, essential hypertension, who is admitted to [facility] on 10/25/2023 with hyperglycemia after presenting from home to [facility] ED for evaluation of altered mental status.   In the setting of the patient's altered mental status, the following history was provided by the patient's ex-wife, as well as my discussions with the EDP and via chart review.  The patient's ex-wife noted the patient to be confused relative to baseline mental status earlier today at which time she took the patient to a local urgent care.  Evaluation at urgent care was notable for elevated glucose, greater than 600, prompting recommendation for the patient to present to the emergency department for further evaluation management thereof.  The patient reports some recent loose stools, although he is unsure as to the duration of this.  Otherwise, he is without acute complaint, including no report of any recent chest pain or shortness of breath.  He has a history of insulin-dependent type 2 diabetes mellitus, with most recent hemoglobin A1c found to be 5.5% when checked in October 2019.  Per chart review, most recent echocardiogram occurred in October 2019 and was notable for LVEF of 55%    ED Course:  Vital signs in the ED were notable for the following: Afebrile; initial heart rates in the 120s, subsequently decreasing into the low 100s following interval IV fluids; systolic pressures in the 120s to 150s; respiratory rate 15-22, oxygen saturation 95 to 100% on room air.  Labs were notable for the following:  Initial CBG greater than 600.  It hydroxybutyric acid pending.  VBG 7.448/36 point 6/48/20 5.3.  BMP was notable for the following: Sodium 143, corrected to approximately 154 when taking into account hyperglycemia, potassium 4.5, bicarbonate 23, anion gap 18, creatinine 2.77 compared to most recent prior creatinine did point of 1.18 on 10/01/2023, BUN to creatinine ratio 22, glucose 800.  High sensitive troponin 545, relative to most recent prior value 18 on 10/01/2023, with follow-up troponin level currently pending.  Initial lactic acid 3.9, with repeat trending down to 3.6.  CBC notable for Locasol, 20,700 with 82% neutrophils.  Urinalysis was associated with a cloudy appearing specimen and notable for 21-50 white blood cells, many bacteria, large leukocyte esterase, no evidence of squamous epithelial cells, specific gravity 1.025, greater than 500 glucose, small hemoglobin in the absence of any RBCs, protein negative, ketones negative.  Urinary drug screen was pan negative.  Blood cultures x 2 were collected prior to initiation of IV antibiotics.  Serum osmolality and serum ethanol levels were ordered, with results currently pending.  Per my interpretation, EKG in ED demonstrated the following: Relative to most recent prior EKG from 10/01/2023, today's EKG shows sinus tachycardia with heart rate 117, normal intervals, nonspecific T wave inversion in leads I, 2, aVF, V4 through V6, less than 1 mm ST depression in V3/V4, and no evidence of ST elevation.  Imaging in the ED, per corresponding formal radiology read, was notable for the following: 1 view chest x-ray, comparison to most recent prior chest x-ray from 09/29/2023 shows no evidence of acute cardiopulmonary process, including no evidence of  infiltrate edema, effusion, or pneumothorax.  Today's chest x-ray shows dilated aortic arch, which appears unchanged from most recent prior chest x-ray.  While in the ED, the following were administered: Zofran 4  mg IV x 1 dose, Rocephin, insulin drip via Endo tool, lactated Ringer's x 2634 cc bolus followed by initiation continuous LR, potassium chloride 20 mEq IV over 2 hours.  Subsequently, the patient was admitted for further evaluation management of presenting hyperglycemia in the setting of severe sepsis due to acute cystitis complicated by acute metabolic encephalopathy, acute kidney injury, with clinical evidence of dehydration, and mildly elevated initial troponin.     Review of Systems: As per HPI otherwise 10 point review of systems negative.   Past Medical History:  Diagnosis Date   COPD (chronic obstructive pulmonary disease) (HCC)    Diabetes mellitus without complication (HCC)    Hypertension     History reviewed. No pertinent surgical history.  Social History:  reports that he has been smoking cigarettes. He has never used smokeless tobacco. He reports that he does not currently use alcohol. He reports that he does not currently use drugs after having used the following drugs: Marijuana.   Allergies  Allergen Reactions   Codeine Phosphate     REACTION: unspecified    History reviewed. No pertinent family history.  Family history reviewed and not pertinent    Prior to Admission medications   Medication Sig Start Date End Date Taking? Authorizing Provider  amLODipine (NORVASC) 10 MG tablet Take 1 tablet (10 mg total) by mouth daily. 10/01/23 10/31/23  Tegeler, Canary Brim, MD  atorvastatin (LIPITOR) 20 MG tablet TAKE 1 TABLET BY MOUTH DAILY FOR CHOLESTEROL 10/01/23   Tegeler, Canary Brim, MD  carvedilol (COREG) 6.25 MG tablet Take 1 tablet (6.25 mg total) by mouth daily. 10/01/23 10/31/23  Tegeler, Canary Brim, MD  Insulin Glargine [facility] KWIKPEN) 100 UNIT/ML Inject 16 Units into the skin at bedtime. 10/01/23 01/02/24  Tegeler, Canary Brim, MD  Insulin Pen Needle (BD PEN NEEDLE NANO U/F) 32G X 4 MM MISC To use with glargine pen. Substitutions as needed are  allowed. 10/01/23 10/31/23  Tegeler, Canary Brim, MD     Objective    Physical Exam: Vitals:   10/25/23 1900 10/25/23 1915 10/25/23 1930 10/25/23 2045  BP: (!) 162/112 (!) 164/114 (!) 154/98 128/81  Pulse: (!) 51 (!) 105 74 (!) 114  Resp: 19 15 18 14   Temp:      TempSrc:      SpO2: 97% 97% 100% 95%  Weight:      Height:        General: appears to be stated age; alert, confused Skin: warm, dry, no rash Head:  AT/[place] Mouth:  Oral mucosa membranes appear dry, normal dentition Neck: supple; trachea midline Heart: Mildly tachycardic, but regular; did not appreciate any M/R/G Lungs: CTAB, did not appreciate any wheezes, rales, or rhonchi Abdomen: + BS; soft, ND, NT Vascular: 2+ pedal pulses b/l; 2+ radial pulses b/l Extremities: no peripheral edema, no muscle wasting Neuro: strength and sensation intact in upper and lower extremities b/l    Labs on Admission: I have personally reviewed following labs and imaging studies  CBC: Recent Labs  Lab 10/25/23 1707 10/25/23 1720  WBC 20.7*  --   NEUTROABS 17.3*  --   HGB 17.1* 17.7*  18.4*  HCT 52.6* 52.0  54.0*  MCV 94.1  --   PLT 235  --    Basic Metabolic Panel: Recent Labs  Lab 10/25/23 1720 10/25/23 1959  NA 137  138 143  K 6.1*  6.2* 4.5  CL 105 102  CO2  --  23  GLUCOSE >700* 800*  BUN 80* 61*  CREATININE 2.90* 2.77*  CALCIUM  --  9.8   GFR: Estimated Creatinine Clearance: 21.3 mL/min (A) (by C-G formula based on SCr of 2.77 mg/dL (H)). Liver Function Tests: No results for input(s): "AST", "ALT", "ALKPHOS", "BILITOT", "PROT", "ALBUMIN" in the last 168 hours. No results for input(s): "LIPASE", "AMYLASE" in the last 168 hours. No results for input(s): "AMMONIA" in the last 168 hours. Coagulation Profile: No results for input(s): "INR", "PROTIME" in the last 168 hours. Cardiac Enzymes: No results for input(s): "CKTOTAL", "CKMB", "CKMBINDEX", "TROPONINI" in the last 168 hours. BNP (last 3 results) No  results for input(s): "PROBNP" in the last 8760 hours. HbA1C: No results for input(s): "HGBA1C" in the last 72 hours. CBG: Recent Labs  Lab 10/25/23 1649 10/25/23 1820 10/25/23 2024 10/25/23 2205 10/25/23 2243  GLUCAP >600* >600* >600* >600* 529*   Lipid Profile: No results for input(s): "CHOL", "HDL", "LDLCALC", "TRIG", "CHOLHDL", "LDLDIRECT" in the last 72 hours. Thyroid Function Tests: No results for input(s): "TSH", "T4TOTAL", "FREET4", "T3FREE", "THYROIDAB" in the last 72 hours. Anemia Panel: No results for input(s): "VITAMINB12", "FOLATE", "FERRITIN", "TIBC", "IRON", "RETICCTPCT" in the last 72 hours. Urine analysis:    Component Value Date/Time   COLORURINE YELLOW 10/25/2023 1714   APPEARANCEUR CLOUDY (A) 10/25/2023 1714   APPEARANCEUR Clear 03/05/2014 1246   LABSPEC 1.025 10/25/2023 1714   LABSPEC 1.004 03/05/2014 1246   PHURINE 5.0 10/25/2023 1714   GLUCOSEU >=500 (A) 10/25/2023 1714   GLUCOSEU Negative 03/05/2014 1246   HGBUR SMALL (A) 10/25/2023 1714   HGBUR negative 11/30/2010 1511   BILIRUBINUR NEGATIVE 10/25/2023 1714   BILIRUBINUR Negative 03/05/2014 1246   KETONESUR NEGATIVE 10/25/2023 1714   PROTEINUR NEGATIVE 10/25/2023 1714   UROBILINOGEN 1.0 11/30/2010 1511   NITRITE NEGATIVE 10/25/2023 1714   LEUKOCYTESUR LARGE (A) 10/25/2023 1714   LEUKOCYTESUR 2+ 03/05/2014 1246    Radiological Exams on Admission: DG Chest Portable 1 View Result Date: 10/25/2023 CLINICAL DATA:  Cough, COPD. EXAM: PORTABLE CHEST 1 VIEW COMPARISON:  09/29/2023. FINDINGS: Normal cardiac silhouette. Massively dilated aortic arch. Lungs are clear. No pneumothorax or pleural effusion. Osseous structures are grossly intact. IMPRESSION: Massively dilated aortic arch. No definite interval change radiographically, but CT would be more definitive. Electronically Signed   By: Layla Maw M.D.   On: 10/25/2023 18:53      Assessment/Plan   Principal Problem:   Hyperglycemia Active  Problems:   Essential hypertension   Acute metabolic encephalopathy   AKI (acute kidney injury) (HCC)   Severe sepsis (HCC)   Acute cystitis   Dehydration   Acute prerenal azotemia   Elevated troponin     #) Hyperglycemia: In the setting of a history of DM2, presenting blood sugar found to be 800, without clinical evidence of DKA, respiratory alkalemia noted on presenting VBG, with normal bicarb on BMP, in the absence of ketones on presenting urinalysis. Of note beta-hydroxybutyrate acid level currently pending.  Degree of elevation of patient's hyperglycemia is potentially consistent with hyperglycemic hyperosmolar nonketotic state, but will check serum osmolality to further assess. his is confused, but awake. No e/o associated coma. Of note, most recent hemoglobin A1c found to be  5.5% when checked  in Oct '19  .  He is noted to be insulin-dependent, with prior outpatient insulin regimen that  included glargine 16 units SQ nightly. Outpatient oral hypoglycemic regimen: None.  Degree of compliance is not entirely clear at this time in the setting of his altered mental status.  Differential for his presenting hyperglycemia includes suspected physiologic stress stemming from presenting severe sepsis due to urinary tract infection.  At this time, no additional source of underlying infection, including chest x-ray shows no evidence of infiltrate.  There is also report of some recent diarrhea, although the chronicity of this is unclear, but raises the possibility of additional potential dehydrating impacts exacerbating his hyperglycemic picture.  Additionally, ACS is felt to be less likely.  He has minimally elevated initial troponin, which appears consistent with type II supply/demand mismatch in the setting of diminished diastolic filling time leading to his initial sinus tachycardia, with likely additional contribution from diminished renal clearance in the setting of acute kidney injury.  Nonspecific  changes on EKG, which are felt to be metabolic in origin, while noting no evidence of STEMI. Denies any recent CP.  UDS negative.   In the ED, insulin drip was initiated, and the following IVF administered: 2.6 L LR bolus. Notable additional presenting labs include serum potassium level of  4.5, for which he is currently 20 meq of iv kcl. In light of this potassium finding,  will transition IVF's at this time to LR with 10 mEq/L Kcl @ 125 cc/hr.      Plan: Insulin drip per DM2 hyperglycemic Endotool protocol. Q1H cbg's. Q4H BMP's in order to monitor ensuing Na and potassium levels. NPO pending ensuing improvement in current degree of hyperglycemia. Transition IVF's to LR  with 10 mEq/L Kcl @ 125 cc/hr, as serum potassium level is less than 5.0. Once [facility] cbg's reflect serum glucose less than 250, will add D5 to existing IVF's. Check serum Mg and Phos levels, w/ prn supplementation per protocol.  Once the patient's glucose is under 250 and is tolerating p.o., will plan to initiate sq basal insulin, while continuing insulin drip for an additional two hours to prevent rebound hyperglycemia, before ultimately turning off insulin drip.  Monitor on telemetry. Monitor strict I's & O's.  Check  A1c level, follow for repeat troponin level. Follow for result of serum ethanol level, serum osmolality.  Further evaluation management of presenting severe sepsis due to urinary tract infection, as further detailed below.                  #) Severe sepsis due to acute cystitis: In the setting of the patient's presenting altered mental status, urinalysis appears consistent with UTI in the context of a cloudy appearing specimen associate with significant pyuria, many bacteria, large leukocyte esterase, in the absence of any squamous epithelial cells to suggest a contaminated specimen.  Suspect that this serves as an inciting factor leading to his hyperglycemic presentation.  SIRS criteria met via leukocytosis  with neutrophilic predominance, tachycardia, tachypnea. Lactic acid level: Initially 3.9, with repeat value trending down to 3.6. Of note, given the associated presence of suspected end organ damage in the form of concominant presenting elevated lactate as well as acute kidney injury in addition to acute encephalopathy, criteria are met for pt's sepsis to be considered severe in nature. However, in the absence of lactic acid level that is greater than or equal to 4.0, and in the absence of any associated hypotension refractory to IVF's, there are no indications for administration of a 30 mL/kg IVF bolus at this time.   Additional ED work-up/management notable for:  Collection of blood cultures x 2 followed by initiation of Rocephin, which will be continued for empiric coverage of acute cystitis.  No e/o additional infectious process at this time, including chest x-ray which showed no evidence of acute cardiopulmonary process, including no evidence of infiltrate.   Plan: CBC w/ diff and CMP in AM.  Follow for results of blood cx's x 2.  Add on urine culture.  Abx: Continue Rocephin.  IV fluids, as above.  Repeat lactic acid in the morning.  Add on procalcitonin level.  Prn acetaminophen for fever.                   #) Acute metabolic encephalopathy: Confusion relative to baseline mental status, which appears to be on the basis of potentially multiple physiologic stressors, including from severe sepsis due to acute cystitis as well as potential secondary contribution from resultant hyperglycemia and dehydration. No overt acute focal neurologic deficits to suggest a contribution from an underlying acute CVA. Seizures are also felt to be less likely.  Of note, presenting VBG showed no evidence of contribution from hypercapnic encephalopathy.  Will expand evaluation for additional potential metabolic contributing factors as outlined below.   Plan: Further evaluation management of presenting  severe sepsis due to urinary tract infection as well as workup/management of hyperglycemia, as above . fall precautions. Delirium precautions. Repeat CMP/CBC in the AM. Check Mg level. check TSH, B12 level. Check CPK level, INR.  Add on procalcitonin level.                  #) Dehydration: Clinical suspicion for such, including the appearance of dry oral mucous membranes as well as laboratory findings notable for acute prerenal azotemia and UA demonstrating elevated specific gravity, in addition to improving tachycardia with IV fluids. Appears to be in the setting of a contribution from auto diuresis in the setting fluid shift resulting from hyperglycemic gradient.  There is also report of some loose stool, although the chronicity of this is unclear.  No e/o associated hypotension.     Plan: Monitor strict I's and O's.  Daily weights.  trending of BMP, as above. IVF's as described above.                 #) Acute Kidney Injury: presenting creatinine 2.77 compared to most recent prior value of 1.18 on 10/01/2023. Appears to be prerenal in nature stemming from dehydration as a consequence of hyperglyemic-associated auto-diuresis, as above. UA with microscopy performed in the ED, which was suggestive of urinary tract infection, with significant pyuria, but was otherwise without evidence of significant urinary cast however, urinalysis did show the presence of hemoglobin, without corresponding RBCs.  Will add on CPK level to further evaluate.  Plan: monitor strict I's & O's and daily weights. Attempt to avoid nephrotoxic agents. IVF's, as above. Check CPK level, random urine sodium, random urine creatinine.  Trending of BMP results, as above.  Add on serum magnesium level.                    #) Elevated troponin: mildly elevated initial troponin of 45, compared to most recent prior value of 18 on 10/01/2023. Suspect that this mildly elevated troponin is on the basis  of supply demand mismatch in the setting of diminished diastolic filling time as a consequence of presenting sinus tachycardia in setting of dehydration, severe sepsis, as opposed to representing a type I process due to acute plaque rupture. Additionally, relative decline  in renal clearance of troponin in the setting of AKI is likely also contributing to presenting troponin elevation. EKG shows some nonspecific T wave changes and ST depression, felt to be metabolic in nature, in the absence of any evidence of STEMI.  Patient denies any chest pain at this time, and chest x-ray shows no evidence of acute cardiopulmonary process, Cleen evidence of pneumothorax.  Overall, ACS is felt to be less likely relative to type 2 supply demand mismatch, as above, but will closely monitor on telemetry overnight while trending trop and pursing updated EKG in the AM to evaluate for improvement in nonspecific changes corresponding to improving metabolic stressors. Presentation clinically less suggestive of acute PE.   Plan: Follow-up for result of repeat troponin.  Monitor on telemetry. PRN EKG for development of chest pain. EKG in AM, as above. Check serum Mg level.  Further evaluation management of presenting severe sepsis due to UTI as well as hyperglycemia, as above. repeat CBC in the AM.                   #) Essential Hypertension: documented h/o such, with outpatient antihypertensive regimen including amlodipine as well as Coreg.  SBP's in the ED today: 120s - 150s mmHg.   Plan: Close monitoring of subsequent BP via routine VS. for now, will plan for resumption of home coreg. Will home norvasc while continue to closely monitor ensuing blood pressure trend.     DVT prophylaxis: SCD's   Code Status: Full code Family Communication: none Disposition Plan: Per Rounding Team Consults called: none;  Admission status: inpatient     I SPENT GREATER THAN 75  MINUTES IN CLINICAL CARE TIME/MEDICAL  DECISION-MAKING IN COMPLETING THIS ADMISSION.      Chaney Born Leanthony Rhett DO [facility]  From 7PM - 7AM   10/25/2023, 10:59 PM

## 2023-10-26 ENCOUNTER — Inpatient Hospital Stay (HOSPITAL_COMMUNITY): Payer: Medicare Other

## 2023-10-26 DIAGNOSIS — E860 Dehydration: Secondary | ICD-10-CM | POA: Diagnosis present

## 2023-10-26 DIAGNOSIS — J9601 Acute respiratory failure with hypoxia: Secondary | ICD-10-CM | POA: Diagnosis not present

## 2023-10-26 DIAGNOSIS — R739 Hyperglycemia, unspecified: Secondary | ICD-10-CM | POA: Diagnosis not present

## 2023-10-26 DIAGNOSIS — N3000 Acute cystitis without hematuria: Secondary | ICD-10-CM | POA: Diagnosis present

## 2023-10-26 DIAGNOSIS — N19 Unspecified kidney failure: Secondary | ICD-10-CM | POA: Diagnosis present

## 2023-10-26 DIAGNOSIS — G9341 Metabolic encephalopathy: Secondary | ICD-10-CM | POA: Diagnosis present

## 2023-10-26 DIAGNOSIS — R609 Edema, unspecified: Secondary | ICD-10-CM

## 2023-10-26 DIAGNOSIS — N179 Acute kidney failure, unspecified: Secondary | ICD-10-CM | POA: Diagnosis present

## 2023-10-26 DIAGNOSIS — R6520 Severe sepsis without septic shock: Secondary | ICD-10-CM | POA: Diagnosis present

## 2023-10-26 DIAGNOSIS — R7989 Other specified abnormal findings of blood chemistry: Secondary | ICD-10-CM | POA: Diagnosis present

## 2023-10-26 LAB — CBC WITH DIFFERENTIAL/PLATELET
Abs Immature Granulocytes: 0.09 10*3/uL — ABNORMAL HIGH (ref 0.00–0.07)
Basophils Absolute: 0.1 10*3/uL (ref 0.0–0.1)
Basophils Relative: 1 %
Eosinophils Absolute: 0 10*3/uL (ref 0.0–0.5)
Eosinophils Relative: 0 %
HCT: 51.7 % (ref 39.0–52.0)
Hemoglobin: 17.2 g/dL — ABNORMAL HIGH (ref 13.0–17.0)
Immature Granulocytes: 0 %
Lymphocytes Relative: 11 %
Lymphs Abs: 2.3 10*3/uL (ref 0.7–4.0)
MCH: 30.9 pg (ref 26.0–34.0)
MCHC: 33.3 g/dL (ref 30.0–36.0)
MCV: 93 fL (ref 80.0–100.0)
Monocytes Absolute: 1.9 10*3/uL — ABNORMAL HIGH (ref 0.1–1.0)
Monocytes Relative: 9 %
Neutro Abs: 15.8 10*3/uL — ABNORMAL HIGH (ref 1.7–7.7)
Neutrophils Relative %: 79 %
Platelets: 209 10*3/uL (ref 150–400)
RBC: 5.56 MIL/uL (ref 4.22–5.81)
RDW: 13.3 % (ref 11.5–15.5)
WBC: 20.2 10*3/uL — ABNORMAL HIGH (ref 4.0–10.5)
nRBC: 0 % (ref 0.0–0.2)

## 2023-10-26 LAB — POCT I-STAT 7, (LYTES, BLD GAS, ICA,H+H)
Acid-base deficit: 1 mmol/L (ref 0.0–2.0)
Acid-base deficit: 1 mmol/L (ref 0.0–2.0)
Bicarbonate: 25.6 mmol/L (ref 20.0–28.0)
Bicarbonate: 23.4 mmol/L (ref 20.0–28.0)
Calcium, Ion: 1.26 mmol/L (ref 1.15–1.40)
Calcium, Ion: 1.25 mmol/L (ref 1.15–1.40)
HCT: 45 % (ref 39.0–52.0)
HCT: 46 % (ref 39.0–52.0)
Hemoglobin: 15.3 g/dL (ref 13.0–17.0)
Hemoglobin: 15.6 g/dL (ref 13.0–17.0)
O2 Saturation: 100 %
O2 Saturation: 100 %
Patient temperature: 98.4
Patient temperature: 102.2
Potassium: 4 mmol/L (ref 3.5–5.1)
Potassium: 4.1 mmol/L (ref 3.5–5.1)
Sodium: 152 mmol/L — ABNORMAL HIGH (ref 135–145)
Sodium: 154 mmol/L — ABNORMAL HIGH (ref 135–145)
TCO2: 27 mmol/L (ref 22–32)
TCO2: 25 mmol/L (ref 22–32)
pCO2 arterial: 46.5 mm[Hg] (ref 32–48)
pCO2 arterial: 40 mm[Hg] (ref 32–48)
pH, Arterial: 7.348 — ABNORMAL LOW (ref 7.35–7.45)
pH, Arterial: 7.385 (ref 7.35–7.45)
pO2, Arterial: 464 mm[Hg] — ABNORMAL HIGH (ref 83–108)
pO2, Arterial: 247 mm[Hg] — ABNORMAL HIGH (ref 83–108)

## 2023-10-26 LAB — OSMOLALITY: Osmolality: 335 mosm/kg (ref 275–295)

## 2023-10-26 LAB — COMPREHENSIVE METABOLIC PANEL
ALT: 20 U/L (ref 0–44)
AST: 30 U/L (ref 15–41)
Albumin: 3.6 g/dL (ref 3.5–5.0)
Alkaline Phosphatase: 173 U/L — ABNORMAL HIGH (ref 38–126)
Anion gap: 16 — ABNORMAL HIGH (ref 5–15)
BUN: 54 mg/dL — ABNORMAL HIGH (ref 8–23)
CO2: 21 mmol/L — ABNORMAL LOW (ref 22–32)
Calcium: 9.9 mg/dL (ref 8.9–10.3)
Chloride: 111 mmol/L (ref 98–111)
Creatinine, Ser: 1.92 mg/dL — ABNORMAL HIGH (ref 0.61–1.24)
GFR, Estimated: 38 mL/min — ABNORMAL LOW (ref >60)
Glucose, Bld: 126 mg/dL — ABNORMAL HIGH (ref 70–99)
Potassium: 3.8 mmol/L (ref 3.5–5.1)
Sodium: 148 mmol/L — ABNORMAL HIGH (ref 135–145)
Total Bilirubin: 0.5 mg/dL (ref <1.2)
Total Protein: 8.2 g/dL — ABNORMAL HIGH (ref 6.5–8.1)

## 2023-10-26 LAB — BLOOD GAS, ARTERIAL
Acid-Base Excess: 3.4 mmol/L — ABNORMAL HIGH (ref 0.0–2.0)
Bicarbonate: 28.5 mmol/L — ABNORMAL HIGH (ref 20.0–28.0)
O2 Saturation: 86.5 %
Patient temperature: 36.9
pCO2 arterial: 44 mm[Hg] (ref 32–48)
pH, Arterial: 7.42 (ref 7.35–7.45)
pO2, Arterial: 54 mm[Hg] — ABNORMAL LOW (ref 83–108)

## 2023-10-26 LAB — PROTIME-INR
INR: 1.1 (ref 0.8–1.2)
Prothrombin Time: 14.7 s (ref 11.4–15.2)

## 2023-10-26 LAB — BASIC METABOLIC PANEL
Anion gap: 14 (ref 5–15)
Anion gap: 14 (ref 5–15)
BUN: 52 mg/dL — ABNORMAL HIGH (ref 8–23)
BUN: 49 mg/dL — ABNORMAL HIGH (ref 8–23)
CO2: 24 mmol/L (ref 22–32)
CO2: 18 mmol/L — ABNORMAL LOW (ref 22–32)
Calcium: 9.9 mg/dL (ref 8.9–10.3)
Calcium: 8.3 mg/dL — ABNORMAL LOW (ref 8.9–10.3)
Chloride: 112 mmol/L — ABNORMAL HIGH (ref 98–111)
Chloride: 119 mmol/L — ABNORMAL HIGH (ref 98–111)
Creatinine, Ser: 2.12 mg/dL — ABNORMAL HIGH (ref 0.61–1.24)
Creatinine, Ser: 2.3 mg/dL — ABNORMAL HIGH (ref 0.61–1.24)
GFR, Estimated: 34 mL/min — ABNORMAL LOW (ref >60)
GFR, Estimated: 31 mL/min — ABNORMAL LOW (ref >60)
Glucose, Bld: 93 mg/dL (ref 70–99)
Glucose, Bld: 76 mg/dL (ref 70–99)
Potassium: 3.6 mmol/L (ref 3.5–5.1)
Potassium: 3.1 mmol/L — ABNORMAL LOW (ref 3.5–5.1)
Sodium: 150 mmol/L — ABNORMAL HIGH (ref 135–145)
Sodium: 151 mmol/L — ABNORMAL HIGH (ref 135–145)

## 2023-10-26 LAB — LACTIC ACID, PLASMA
Lactic Acid, Venous: 3 mmol/L (ref 0.5–1.9)
Lactic Acid, Venous: 2.4 mmol/L (ref 0.5–1.9)

## 2023-10-26 LAB — TROPONIN I (HIGH SENSITIVITY)
Troponin I (High Sensitivity): 74 ng/L — ABNORMAL HIGH (ref <18)
Troponin I (High Sensitivity): 74 ng/L — ABNORMAL HIGH (ref <18)

## 2023-10-26 LAB — ETHANOL: Alcohol, Ethyl (B): <10 mg/dL (ref <10)

## 2023-10-26 LAB — GLUCOSE, CAPILLARY
Glucose-Capillary: 118 mg/dL — ABNORMAL HIGH (ref 70–99)
Glucose-Capillary: 155 mg/dL — ABNORMAL HIGH (ref 70–99)
Glucose-Capillary: 182 mg/dL — ABNORMAL HIGH (ref 70–99)
Glucose-Capillary: 204 mg/dL — ABNORMAL HIGH (ref 70–99)
Glucose-Capillary: 280 mg/dL — ABNORMAL HIGH (ref 70–99)
Glucose-Capillary: 79 mg/dL (ref 70–99)
Glucose-Capillary: 87 mg/dL (ref 70–99)
Glucose-Capillary: 93 mg/dL (ref 70–99)

## 2023-10-26 LAB — CK: Total CK: 79 U/L (ref 49–397)

## 2023-10-26 LAB — PROCALCITONIN: Procalcitonin: 4.86 ng/mL

## 2023-10-26 LAB — TSH: TSH: 2.165 u[IU]/mL (ref 0.350–4.500)

## 2023-10-26 LAB — BETA-HYDROXYBUTYRIC ACID
Beta-Hydroxybutyric Acid: 0.19 mmol/L (ref 0.05–0.27)
Beta-Hydroxybutyric Acid: 0.15 mmol/L (ref 0.05–0.27)
Beta-Hydroxybutyric Acid: 0.43 mmol/L — ABNORMAL HIGH (ref 0.05–0.27)

## 2023-10-26 LAB — VITAMIN B12: Vitamin B-12: 1471 pg/mL — ABNORMAL HIGH (ref 180–914)

## 2023-10-26 LAB — MRSA NEXT GEN BY PCR, NASAL: MRSA by PCR Next Gen: NOT DETECTED

## 2023-10-26 LAB — BRAIN NATRIURETIC PEPTIDE: B Natriuretic Peptide: 955.3 pg/mL — ABNORMAL HIGH (ref 0.0–100.0)

## 2023-10-26 LAB — MAGNESIUM: Magnesium: 2.4 mg/dL (ref 1.7–2.4)

## 2023-10-26 LAB — PHOSPHORUS: Phosphorus: 3.1 mg/dL (ref 2.5–4.6)

## 2023-10-26 MED ORDER — INSULIN GLARGINE-YFGN 100 UNIT/ML ~~LOC~~ SOLN
5.0000 [IU] | Freq: Every day | SUBCUTANEOUS | Status: DC
Start: 2023-10-26 — End: 2023-10-28
  Administered 2023-10-26 – 2023-10-27 (×2): 5 [IU] via SUBCUTANEOUS
  Filled 2023-10-26 (×3): qty 0.05

## 2023-10-26 MED ORDER — DOCUSATE SODIUM 50 MG/5ML PO LIQD
100.0000 mg | Freq: Two times a day (BID) | ORAL | Status: DC
  Administered 2023-10-29 – 2023-10-31 (×2): 100 mg
  Filled 2023-10-26 (×4): qty 10

## 2023-10-26 MED ORDER — POLYETHYLENE GLYCOL 3350 17 G PO PACK
17.0000 g | PACK | Freq: Every day | ORAL | Status: DC
Start: 2023-10-27 — End: 2023-11-02
  Filled 2023-10-26: qty 1

## 2023-10-26 MED ORDER — VANCOMYCIN HCL 750 MG/150ML IV SOLN: 750.0000 mg | INTRAVENOUS | Status: DC

## 2023-10-26 MED ORDER — GUAIFENESIN ER 600 MG PO TB12
1200.0000 mg | ORAL_TABLET | Freq: Two times a day (BID) | ORAL | Status: DC
  Administered 2023-10-26: 1200 mg via ORAL
  Filled 2023-10-26: qty 2

## 2023-10-26 MED ORDER — POTASSIUM CHLORIDE 20 MEQ PO PACK
40.0000 meq | PACK | Freq: Once | ORAL | Status: AC
  Administered 2023-10-26: 40 meq
  Filled 2023-10-26: qty 2

## 2023-10-26 MED ORDER — POLYETHYLENE GLYCOL 3350 17 G PO PACK
17.0000 g | PACK | Freq: Every day | ORAL | Status: DC
  Administered 2023-10-26: 17 g
  Filled 2023-10-26: qty 1

## 2023-10-26 MED ORDER — SODIUM CHLORIDE 0.9 % IV SOLN: 2.0000 g | INTRAVENOUS | Status: DC

## 2023-10-26 MED ORDER — FENTANYL CITRATE PF 50 MCG/ML IJ SOSY: 25.0000 ug | PREFILLED_SYRINGE | INTRAMUSCULAR | Status: DC | PRN

## 2023-10-26 MED ORDER — FAMOTIDINE 20 MG PO TABS: 20.0000 mg | ORAL_TABLET | Freq: Two times a day (BID) | ORAL | Status: DC

## 2023-10-26 MED ORDER — REVEFENACIN 175 MCG/3ML IN SOLN
175.0000 ug | Freq: Every day | RESPIRATORY_TRACT | Status: DC
  Administered 2023-10-26 – 2023-11-02 (×8): 175 ug via RESPIRATORY_TRACT
  Filled 2023-10-26 (×8): qty 3

## 2023-10-26 MED ORDER — FENTANYL CITRATE PF 50 MCG/ML IJ SOSY
50.0000 ug | PREFILLED_SYRINGE | INTRAMUSCULAR | Status: AC | PRN
  Administered 2023-10-26 (×3): 50 ug via INTRAVENOUS
  Filled 2023-10-26 (×3): qty 1

## 2023-10-26 MED ORDER — PIPERACILLIN-TAZOBACTAM 3.375 G IVPB
3.3750 g | Freq: Three times a day (TID) | INTRAVENOUS | Status: DC
  Administered 2023-10-26 – 2023-10-29 (×10): 3.375 g via INTRAVENOUS
  Filled 2023-10-26 (×10): qty 50

## 2023-10-26 MED ORDER — FAMOTIDINE 20 MG PO TABS
20.0000 mg | ORAL_TABLET | Freq: Every day | ORAL | Status: DC
  Administered 2023-10-26 – 2023-11-01 (×6): 20 mg
  Filled 2023-10-26 (×6): qty 1

## 2023-10-26 MED ORDER — ROCURONIUM BROMIDE 10 MG/ML (PF) SYRINGE
PREFILLED_SYRINGE | INTRAVENOUS | Status: AC
  Administered 2023-10-26: 100 mg
  Filled 2023-10-26: qty 10

## 2023-10-26 MED ORDER — PHENYLEPHRINE 80 MCG/ML (10ML) SYRINGE FOR IV PUSH (FOR BLOOD PRESSURE SUPPORT)
PREFILLED_SYRINGE | INTRAVENOUS | Status: AC
  Administered 2023-10-26: 400 ug via INTRAVENOUS
  Filled 2023-10-26: qty 10

## 2023-10-26 MED ORDER — CHLORHEXIDINE GLUCONATE CLOTH 2 % EX PADS
6.0000 | MEDICATED_PAD | Freq: Every day | CUTANEOUS | Status: DC
  Administered 2023-10-27 – 2023-11-02 (×7): 6 via TOPICAL

## 2023-10-26 MED ORDER — ARFORMOTEROL TARTRATE 15 MCG/2ML IN NEBU
15.0000 ug | INHALATION_SOLUTION | Freq: Two times a day (BID) | RESPIRATORY_TRACT | Status: DC
  Administered 2023-10-26 – 2023-11-02 (×15): 15 ug via RESPIRATORY_TRACT
  Filled 2023-10-26 (×14): qty 2

## 2023-10-26 MED ORDER — PIVOT 1.5 CAL PO LIQD
1000.0000 mL | ORAL | Status: DC
  Administered 2023-10-26 – 2023-10-27 (×2): 1000 mL
  Filled 2023-10-26 (×2): qty 1000

## 2023-10-26 MED ORDER — VANCOMYCIN HCL 1250 MG/250ML IV SOLN
1250.0000 mg | Freq: Once | INTRAVENOUS | Status: AC
  Administered 2023-10-26: 1250 mg via INTRAVENOUS
  Filled 2023-10-26: qty 250

## 2023-10-26 MED ORDER — KETAMINE HCL 50 MG/5ML IJ SOSY
PREFILLED_SYRINGE | INTRAMUSCULAR | Status: AC
  Administered 2023-10-26: 100 mg
  Filled 2023-10-26: qty 10

## 2023-10-26 MED ORDER — LACTATED RINGERS IV BOLUS
1000.0000 mL | Freq: Once | INTRAVENOUS | Status: AC
  Administered 2023-10-26: 1000 mL via INTRAVENOUS

## 2023-10-26 MED ORDER — DOCUSATE SODIUM 50 MG/5ML PO LIQD
100.0000 mg | Freq: Two times a day (BID) | ORAL | Status: DC
  Administered 2023-10-26: 100 mg
  Filled 2023-10-26: qty 10

## 2023-10-26 MED ORDER — FENTANYL CITRATE PF 50 MCG/ML IJ SOSY
PREFILLED_SYRINGE | INTRAMUSCULAR | Status: AC
  Filled 2023-10-26: qty 2

## 2023-10-26 MED ORDER — HEPARIN SODIUM (PORCINE) 5000 UNIT/ML IJ SOLN
5000.0000 [IU] | Freq: Three times a day (TID) | INTRAMUSCULAR | Status: DC
  Administered 2023-10-26 – 2023-11-02 (×21): 5000 [IU] via SUBCUTANEOUS
  Filled 2023-10-26 (×21): qty 1

## 2023-10-26 MED ORDER — CARVEDILOL 6.25 MG PO TABS: 6.2500 mg | ORAL_TABLET | Freq: Every day | ORAL | Status: DC

## 2023-10-26 MED ORDER — ALBUMIN HUMAN 25 % IV SOLN
25.0000 g | Freq: Four times a day (QID) | INTRAVENOUS | Status: AC
  Administered 2023-10-26 – 2023-10-27 (×4): 25 g via INTRAVENOUS
  Filled 2023-10-26 (×4): qty 100

## 2023-10-26 MED ORDER — FREE WATER
200.0000 mL | Freq: Four times a day (QID) | Status: DC
Start: 2023-10-26 — End: 2023-10-29
  Administered 2023-10-26 – 2023-10-29 (×8): 200 mL

## 2023-10-26 MED ORDER — FAMOTIDINE 20 MG PO TABS
20.0000 mg | ORAL_TABLET | Freq: Two times a day (BID) | ORAL | Status: DC
  Administered 2023-10-26: 20 mg
  Filled 2023-10-26: qty 1

## 2023-10-26 MED ORDER — GUAIFENESIN 100 MG/5ML PO LIQD
10.0000 mL | Freq: Two times a day (BID) | ORAL | Status: DC
Start: 2023-10-26 — End: 2023-11-02
  Administered 2023-10-26 – 2023-11-02 (×13): 10 mL
  Filled 2023-10-26 (×13): qty 10

## 2023-10-26 MED ORDER — PROPOFOL 1000 MG/100ML IV EMUL
5.0000 ug/kg/min | INTRAVENOUS | Status: DC
  Administered 2023-10-26: 5 ug/kg/min via INTRAVENOUS
  Administered 2023-10-26: 30 ug/kg/min via INTRAVENOUS
  Administered 2023-10-27: 60 ug/kg/min via INTRAVENOUS
  Filled 2023-10-26 (×4): qty 100

## 2023-10-26 MED ORDER — ETOMIDATE 2 MG/ML IV SOLN
INTRAVENOUS | Status: AC
  Filled 2023-10-26: qty 20

## 2023-10-26 MED ORDER — PIPERACILLIN-TAZOBACTAM IN DEX 2-0.25 GM/50ML IV SOLN
2.2500 g | Freq: Three times a day (TID) | INTRAVENOUS | Status: DC
  Administered 2023-10-26: 2.25 g via INTRAVENOUS
  Filled 2023-10-26 (×2): qty 50

## 2023-10-26 MED ORDER — POTASSIUM CHLORIDE CRYS ER 20 MEQ PO TBCR: 40.0000 meq | EXTENDED_RELEASE_TABLET | Freq: Once | ORAL | Status: DC

## 2023-10-26 MED ORDER — SODIUM CHLORIDE 0.9 % IV SOLN
500.0000 mg | INTRAVENOUS | Status: AC
  Administered 2023-10-26 – 2023-10-28 (×3): 500 mg via INTRAVENOUS
  Filled 2023-10-26 (×3): qty 5

## 2023-10-26 MED ORDER — ORAL CARE MOUTH RINSE: 15.0000 mL | OROMUCOSAL | Status: DC | PRN

## 2023-10-26 MED ORDER — DOCUSATE SODIUM 50 MG/5ML PO LIQD: 100.0000 mg | Freq: Two times a day (BID) | ORAL | Status: DC

## 2023-10-26 MED ORDER — SUCCINYLCHOLINE CHLORIDE 200 MG/10ML IV SOSY
PREFILLED_SYRINGE | INTRAVENOUS | Status: AC
  Filled 2023-10-26: qty 10

## 2023-10-26 MED ORDER — ORAL CARE MOUTH RINSE
15.0000 mL | OROMUCOSAL | Status: DC
  Administered 2023-10-26 – 2023-10-28 (×22): 15 mL via OROMUCOSAL

## 2023-10-26 MED ORDER — NOREPINEPHRINE 4 MG/250ML-% IV SOLN
2.0000 ug/min | INTRAVENOUS | Status: DC
  Administered 2023-10-26: 2 ug/min via INTRAVENOUS

## 2023-10-26 MED ORDER — NOREPINEPHRINE 4 MG/250ML-% IV SOLN
INTRAVENOUS | Status: AC
  Administered 2023-10-26: 20 ug/min via INTRAVENOUS
  Filled 2023-10-26: qty 250

## 2023-10-26 MED ORDER — FENTANYL CITRATE PF 50 MCG/ML IJ SOSY
50.0000 ug | PREFILLED_SYRINGE | INTRAMUSCULAR | Status: DC | PRN
Start: 2023-10-26 — End: 2023-10-28
  Administered 2023-10-26 – 2023-10-27 (×2): 50 ug via INTRAVENOUS
  Administered 2023-10-27: 100 ug via INTRAVENOUS
  Administered 2023-10-27: 150 ug via INTRAVENOUS
  Filled 2023-10-26: qty 1
  Filled 2023-10-26: qty 2
  Filled 2023-10-26: qty 1
  Filled 2023-10-26: qty 3

## 2023-10-26 MED ORDER — ACETAMINOPHEN 650 MG RE SUPP: 650.0000 mg | Freq: Four times a day (QID) | RECTAL | Status: DC | PRN

## 2023-10-26 MED ORDER — PHENYLEPHRINE 80 MCG/ML (10ML) SYRINGE FOR IV PUSH (FOR BLOOD PRESSURE SUPPORT): 400.0000 ug | PREFILLED_SYRINGE | Freq: Once | INTRAVENOUS | Status: AC | PRN

## 2023-10-26 MED ORDER — POLYETHYLENE GLYCOL 3350 17 G PO PACK: 17.0000 g | PACK | Freq: Every day | ORAL | Status: DC

## 2023-10-26 MED ORDER — MELATONIN 3 MG PO TABS
3.0000 mg | ORAL_TABLET | Freq: Every evening | ORAL | Status: DC | PRN
  Administered 2023-11-01: 3 mg
  Filled 2023-10-26: qty 1

## 2023-10-26 MED ORDER — INSULIN GLARGINE-YFGN 100 UNIT/ML ~~LOC~~ SOLN
10.0000 [IU] | Freq: Every day | SUBCUTANEOUS | Status: DC
  Administered 2023-10-26: 10 [IU] via SUBCUTANEOUS
  Filled 2023-10-26 (×2): qty 0.1

## 2023-10-26 MED ORDER — MIDAZOLAM HCL 2 MG/2ML IJ SOLN
INTRAMUSCULAR | Status: AC
  Administered 2023-10-26: 2 mg
  Filled 2023-10-26: qty 2

## 2023-10-26 MED ORDER — ACETAMINOPHEN 325 MG PO TABS
650.0000 mg | ORAL_TABLET | Freq: Four times a day (QID) | ORAL | Status: DC | PRN
  Administered 2023-10-26: 650 mg
  Filled 2023-10-26: qty 2

## 2023-10-26 MED ORDER — INSULIN ASPART 100 UNIT/ML IJ SOLN
0.0000 [IU] | INTRAMUSCULAR | Status: DC
  Administered 2023-10-26 (×2): 2 [IU] via SUBCUTANEOUS
  Administered 2023-10-27: 3 [IU] via SUBCUTANEOUS
  Administered 2023-10-27: 2 [IU] via SUBCUTANEOUS
  Administered 2023-10-27: 7 [IU] via SUBCUTANEOUS
  Administered 2023-10-27: 1 [IU] via SUBCUTANEOUS
  Administered 2023-10-27: 3 [IU] via SUBCUTANEOUS
  Administered 2023-10-27: 2 [IU] via SUBCUTANEOUS
  Administered 2023-10-28: 3 [IU] via SUBCUTANEOUS
  Administered 2023-10-28: 5 [IU] via SUBCUTANEOUS
  Administered 2023-10-28 (×4): 3 [IU] via SUBCUTANEOUS
  Administered 2023-10-29: 9 [IU] via SUBCUTANEOUS
  Administered 2023-10-29: 2 [IU] via SUBCUTANEOUS
  Administered 2023-10-29 (×2): 3 [IU] via SUBCUTANEOUS
  Administered 2023-10-29: 7 [IU] via SUBCUTANEOUS
  Administered 2023-10-29: 3 [IU] via SUBCUTANEOUS
  Administered 2023-10-30: 9 [IU] via SUBCUTANEOUS
  Administered 2023-10-30: 7 [IU] via SUBCUTANEOUS

## 2023-10-26 NOTE — Progress Notes (Signed)
Sputum sample from bronch sent to lab

## 2023-10-26 NOTE — Plan of Care (Signed)
  Problem: Education: Goal: Knowledge of General Education information will improve Description: Including pain rating scale, medication(s)/side effects and non-pharmacologic comfort measures Outcome: Progressing   Problem: Health Behavior/Discharge Planning: Goal: Ability to manage health-related needs will improve Outcome: Progressing   Problem: Clinical Measurements: Goal: Ability to maintain clinical measurements within normal limits will improve Outcome: Progressing Goal: Will remain free from infection Outcome: Progressing Goal: Diagnostic test results will improve Outcome: Progressing Goal: Respiratory complications will improve Outcome: Progressing Goal: Cardiovascular complication will be avoided Outcome: Progressing   Problem: Activity: Goal: Risk for activity intolerance will decrease Outcome: Progressing   Problem: Nutrition: Goal: Adequate nutrition will be maintained Outcome: Progressing   Problem: Coping: Goal: Level of anxiety will decrease Outcome: Progressing   Problem: Elimination: Goal: Will not experience complications related to bowel motility Outcome: Progressing Goal: Will not experience complications related to urinary retention Outcome: Progressing   Problem: Pain Management: Goal: General experience of comfort will improve Outcome: Progressing   Problem: Safety: Goal: Ability to remain free from injury will improve Outcome: Progressing   Problem: Skin Integrity: Goal: Risk for impaired skin integrity will decrease Outcome: Progressing   Problem: Education: Goal: Ability to describe self-care measures that may prevent or decrease complications (Diabetes Survival Skills Education) will improve Outcome: Progressing Goal: Individualized Educational Video(s) Outcome: Progressing   Problem: Coping: Goal: Ability to adjust to condition or change in health will improve Outcome: Progressing   Problem: Fluid Volume: Goal: Ability to  maintain a balanced intake and output will improve Outcome: Progressing   Problem: Health Behavior/Discharge Planning: Goal: Ability to identify and utilize available resources and services will improve Outcome: Progressing Goal: Ability to manage health-related needs will improve Outcome: Progressing   Problem: Metabolic: Goal: Ability to maintain appropriate glucose levels will improve Outcome: Progressing   Problem: Nutritional: Goal: Maintenance of adequate nutrition will improve Outcome: Progressing Goal: Progress toward achieving an optimal weight will improve Outcome: Progressing   Problem: Skin Integrity: Goal: Risk for impaired skin integrity will decrease Outcome: Progressing   Problem: Tissue Perfusion: Goal: Adequacy of tissue perfusion will improve Outcome: Progressing   Problem: Activity: Goal: Ability to tolerate increased activity will improve Outcome: Progressing   Problem: Respiratory: Goal: Ability to maintain a clear airway and adequate ventilation will improve Outcome: Progressing   Problem: Role Relationship: Goal: Method of communication will improve Outcome: Progressing

## 2023-10-26 NOTE — Progress Notes (Signed)
eLink Physician-Brief Progress Note Patient Name: JONATHON CONGER DOB: 07-03-1956 MRN: [id number]   Date of Service  10/26/2023  HPI/Events of Note  67 year old male with a history of insulin-dependent type 2 diabetes mellitus, hypertension, and hyperglycemia who initially presented to the emergency department with altered mental status and was admitted to internal medicine for management of hyperglycemia and severe sepsis secondary to acute cystitis.  On the floors, he developed progressive hypoxemia wearing escalating oxygen supplementation.  Critical care transfer the patient to the ICU for further management.  Workup reveals tachypnea, tachycardia, and hypertension.  Saturating 99% on nonrebreather.  Results are consistent with profound hypoxemia, hyperglycemia, elevated creatinine, anion gap metabolic acidosis lactic acidosis.  Has leukocytosis and polycythemia as well.  Chest radiograph with endotracheal tube in appropriate positioning.  eICU Interventions  Maintain mechanical ventilation, continue propofol and as needed fentanyl to maintain RASS.  Utilize norepinephrine as needed to make MAP greater than 65.  Repeat echocardiogram normal  Continue broad-spectrum antibiotics with vancomycin and Zosyn. Bronch cultures, blood cultures sent.  If hypoxemia is refractory to antibiotics, could consider CT PE study  Famotidine for GI prophylaxis DVT prophylaxis with SCDs, consider initiation of enoxaparin.     Intervention Category Evaluation Type: New Patient Evaluation  Paola Flynt 10/26/2023, 3:26 AM

## 2023-10-26 NOTE — Progress Notes (Signed)
Pharmacy Antibiotic Note  [name] is a 67 y.o. male admitted on 10/25/2023 with urosepsis, now s/p intubation .  Pharmacy has been consulted for zosyn and vancomycin dosing.  -WBC 20.7, sCr 2.77 (bl ~1.1), Lactate 3.6, afebrile -Blood, trach aspirate cultures collected -Ceftriaxone x1 in ED  Plan: -Zosyn 2.25gm IV every 8 hours -Vancomycin 1250mg  IV x1 -Vancomycin 750mg  IV every 48 hours (AUC 502, Vd 0.72, TBW) -Monitor renal function given elevation in sCr -Follow up signs of clinical improvement, LOT, de-escalation of antibiotics   Height: 5\' 11"  (180.3 cm) Weight: 51 kg (112 lb 7 oz) IBW/kg (Calculated) : 75.3  Temp (24hrs), Avg:98.9 F (37.2 C), Min:98.4 F (36.9 C), Max:99.9 F (37.7 C)  Recent Labs  Lab 10/25/23 1707 10/25/23 1720 10/25/23 1959 10/25/23 2041  WBC 20.7*  --   --   --   CREATININE  --  2.90* 2.77*  --   LATICACIDVEN  --  3.9*  --  3.6*    Estimated Creatinine Clearance: 19.2 mL/min (A) (by C-G formula based on SCr of 2.77 mg/dL (H)).    Allergies  Allergen Reactions   Codeine Phosphate     REACTION: unspecified    Antimicrobials this admission: Zosyn 12/15 >>  Vancomycin 12/15 >>   Microbiology results: 12/15 BCx:  12/15 tracheal aspirate:   12/15 MRSA PCR:   Thank you for allowing pharmacy to be a part of this patient's care.  Arabella Merles, PharmD. Clinical Pharmacist 10/26/2023 3:52 AM

## 2023-10-26 NOTE — Significant Event (Signed)
Rapid Response Event Note   Reason for Call :  SOB, hypoxia, congested cough  Initial Focused Assessment:  Pt lying in bed with eyes open. His breathing is tachypneic and labored, SpO2-83% on 9L HFNC. Lungs with rales/rhonchi t/o. Pt is confused, only following some simple commands. He is able to cough up some of his secretions. Pt increased to 15L HFNC(salter). Deep oral suctioning performed-moderate amount thick tan/yellow suctioned out.   T-99.6, HR-122, BP- RR-32, SpO2-94% on 15L HFNC  Interventions:  HFNC(salter)>NRB Deep oral suctioning PCXR-1. No active disease. 2. 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 ABG-7.42/44/54/28.5 on NRB IVF stopped Pct, BNP Tx to 2H Plan of Care:  Despite 15L HFNC, pt's SpO2 still dropping to 80s. PCCM consulted and to bedside. Stridor noted on exam now. Pt to be transferred to [facility] for intubation.   Event Summary:   MD Notified: Dr. Howerter(Admitting TRH MD) notified and came to bedside. Dr. Johnnette Barrios covering [facility] MD) notified Call Time:0021 Arrival Time:0025 End Time:0330  Terrilyn Saver, RN

## 2023-10-26 NOTE — Progress Notes (Signed)
Patient transferred from the ED. Unable to complete admission due to altered mental status

## 2023-10-26 NOTE — Progress Notes (Signed)
VASCULAR LAB    Bilateral lower extremity venous duplex has been performed.  See CV proc for preliminary results.   Darick Fetters, RVT 10/26/2023, 12:32 PM

## 2023-10-26 NOTE — Progress Notes (Signed)
TRH Update:  Patient admitted earlier this evening with hyperglycemia, severe sepsis due to urinary tract infection, now with a change in respiratory status.  After receiving nearly 3 liters in ivf boluses along with insulin drip via endotool, the patient now sounds rhonchorous with corresponding development of supplemental oxygen requirement.  Earlier this evening he was maintaining oxygen saturations of 95 to 100% on room air, and is now on 15 L high flow salter, with oxygen saturations in the low 90s. He is now coughing up thick sputum, that has been partially amenable to oral suctioning, although an order for deep nasal suctioning is now being placed.   Given the above changes, will check STAT cxr at this time, along with ABG. Also, adding on procalcitonin level and BNP. Initial CXR was clear, although an initial false radiographic negative from an infectious standpoint is possible given initial clinical e/o of dehydration. I've also added flutter valve and scheduled guaifenesin.  May also consider chest PT.  Most recent echo was performed in Oct 2019, which showed ER 55% and no significant valve pathology.   Also, in light of the above changes, will stop his IVF's, pending updated CXR eval. Most recent cbg has improved to 280 following initial blood sugar of 800. He was not in dka, so there is not an indication to continue the insulin drip from an AG standpoint. I have now resumed slightly more than half of his outpatient basal insulin, ordering glargine 10 units subcu daily, with first dose now, and will subsequently discontinue the insulin drip.  Have ordered ensuing q 4 hour CBG monitoring with associated sliding scale insulin.   At time of admission, criteria were met for severe sepsis (wbc of 20,700 with tachycardia, tachypnea), elevated LA, with UA suggestive of infection with significant pyuria. He was started on Rocephin at that time.   Will monitor for result of updated chest x-ray,  procalcitonin level to  determine if expansion of IV antibiotics for potential underlying respiratory infection is warranted.  I will also update our floor coverage regarding the above.     Ian Pigg, DO Hospitalist

## 2023-10-26 NOTE — Consult Note (Signed)
NAME:  [name], MRN:  [id number], DOB:  01-31-1956, LOS: 1 ADMISSION DATE:  10/25/2023 History of Present Illness:  67 year old male patient with a past medical history of insulin-dependent type 2 diabetes mellitus, hypertension, HLD frail and cachectic who presented to [facility] on 12/14 for altered mental status.  History taken mainly from chart dissection.  Unable to reach his ex-wife.  Seems like he presented on 12/14 with altered mental status, brought in by his ex-wife.  Was found to be in HHS with hyperglycemia more than 800.  No signs of DKA, urine negative for ketones.  Labs initially with leukocytosis with a WBC count of 20.7 with neutrophilic predominance.  H&H stable.  BMP with elevated potassium at 6.1.  AKI with creatinine 2.9 mg/dL from a baseline of 7.42 mg/dL.  Anion gap 18.  UA showing large leukocyte and many bacteria.  Therefore he was thought to have UTI leading to HHS.  He was started on ceftriaxone.  Insulin sliding scale and long acting.  Unfortunately course complicated by hypoxic respiratory failure over the course of the day with increased oxygen requirement to 15 L nasal cannula.  Noted to have extensive thick secretions.  On encounter he was not able to protect his airways with noticeable stridor.  Therefore the decision was made to intubate patient.  Bronchoscopy followed which showed extensive purulent secretions.  Antibiotics were broadened.  Surprisingly chest x-ray without any parenchymal lung disease.  Pertinent  Medical History  As above.   Significant Hospital Events: Including procedures, antibiotic start and stop dates in addition to other pertinent events   12/15 admitted to the ICU with acute hypoxic resp failure. Intubated.   Objective   Blood pressure (!) 126/94, pulse (!) 129, temperature 98.4 F (36.9 C), temperature source Axillary, resp. rate (!) 24, height 5\' 11"  (1.803 m), weight 51 kg, SpO2 97%.    Vent Mode: PRVC FiO2 (%):   [60 %-100 %] 60 % Set Rate:  [24 bmp] 24 bmp Vt Set:  [600 mL] 600 mL PEEP:  [8 cmH20] 8 cmH20 Plateau Pressure:  [18 cmH20] 18 cmH20   Intake/Output Summary (Last 24 hours) at 10/26/2023 0610 Last data filed at 10/25/2023 2336 Gross per 24 hour  Intake 14.41 ml  Output --  Net 14.41 ml   Filed Weights   10/25/23 1700 10/26/23 0054 10/26/23 0305  Weight: 56.7 kg 53.4 kg 51 kg    Examination: GEN cachectic, intubated sedated HEENT supple neck, reactive pupils CVS Normal S1, Normal S2, RRR  Lungs coarse breath sounds bilaterally GI soft nontender nondistended positive bowel sounds Extremities warm well-perfused no edema  Labs and imaging were reviewed  Assessment & Plan:  67 year old male patient with a past medical history of insulin-dependent type 2 diabetes mellitus, hypertension, HLD frail and cachectic who presented to [facility] on 12/14 for altered mental status. C/b acute hypoxic respiratory failure requiring intubation and MV on 12/15.   #Acute hypoxic respiratory failure due to.Marland Kitchen  #CAP with extensive secretions noted on bronchoscopy. Severe hypotension post intubation raising concern for preload dependance, possible PE. Echocardiogram pending and [place] LE pending.  #Post intubation hypotension requring initiation of levo.  #UTI with Large leukocye and many bacteria. Micrp peding.  #HHS due to the above. Now resolved. #AKI with Cr. 2.7mg /dl from baseline 5.95 mg/fdl likely prerenal/septic ATN.   #FTT  #Hypertension  Neuro: Sedation with propofol for RASS 0 to -1, Fentanyl PRN for CPOT<2.  CVS: MAP > 65.  Holding home antihypertensives. [place] LE for DVT. Echocardiogram.  Pulmonary/ID: Broad spec abx with Vanc and zosyn pending micro data. MRSA swab.  GI: Pepcid  for prophylaxis. TF in the am. Follow phos level.  Endo: ISS - Long acting. Might need to start on insulin drip pending next POC.  Heme: Heparin for DVT prophy Renal: Follow UOP. Trend Cr. Avoid nephrotox  agents.   Best Practice (right click and "Reselect all SmartList Selections" daily)   Diet/type: NPO DVT prophylaxis prophylactic heparin  Pressure ulcer(s): N/A GI prophylaxis: H2B Lines: N/A Foley:  N/A Code Status:  full code  Unable to reach family members/Ex wife overnight. All procedures were done under emergent nature.   I spent 60 minutes caring for this patient today, including preparing to see the patient, obtaining a medical history , reviewing a separately obtained history, performing a medically appropriate examination and/or evaluation, ordering medications, tests, or procedures, documenting clinical information in the electronic health record, and independently interpreting results (not separately reported/billed) and communicating results to the patient/family/caregiver  Janann Colonel, MD [place] [facility] 10/26/2023 6:13 AM

## 2023-10-26 NOTE — Procedures (Signed)
Bronchoscopy Procedure Note  [name]  [id number]  Nov 25, 1955  Date:10/26/23  Time:5:43 AM   Provider Performing:Jean-Pierre Fransheska Willingham   Procedure(s):  Flexible bronchoscopy with bronchial alveolar lavage (40981)  Indication(s) Diagnostic and airway clearance   Consent Unable to obtain consent due to emergent nature of procedure.  Anesthesia Already intubated and sedated in the ICU  Time Out Verified patient identification, verified procedure, site/side was marked, verified correct patient position, special equipment/implants available, medications/allergies/relevant history reviewed, required imaging and test results available.  Sterile Technique Usual hand hygiene, masks, gowns, and gloves were used  Procedure Description Bronchoscope advanced through endotracheal tube and into airway.  Airways were examined down to subsegmental level with findings noted below.   Following diagnostic evaluation, right lung wash done and sent for gram stain and culture.   Findings: Carina appeared sharp. RUL with some secretions, RML and RLL with extensive white secretions that were suctioned and cleared.Mucosae appeared normal without any suspicious lesions. LUL and LLL with some secretions but less extensive. No suspicious lesions.   Complications/Tolerance None; patient tolerated the procedure well. Chest X-ray is not needed post procedure.  EBL Minimal  Specimen(s) RLL Wash sent for gram stain and culture.    Janann Colonel, MD [place] [facility] 10/26/2023 5:46 AM

## 2023-10-26 NOTE — Progress Notes (Signed)
     Referral received for [name]: goals of care discussion. Chart reviewed. Patient is unable to engage appropriately in discussions. No emergency contact or next-of-kin information in chart. TOC consulted for assistance determining next-of-kin.   PMT will attempt to contact next-of-kin when available. Detailed note and recommendations to follow once GOC has been completed.   Thank you for your referral and allowing PMT to assist in [name] [facility] care.   Sarina Ser, NP Palliative Medicine Team  Team Phone # [phone]   NO CHARGE

## 2023-10-26 NOTE — Procedures (Signed)
Intubation Procedure Note  GURMINDER NAJAFI  [id number]  17-Feb-1956  Date:10/26/23  Time:3:29 AM   Provider Performing:Jeanice Dempsey D Suzie Portela    Procedure: Intubation (31500)  Indication(s) Respiratory Failure  Consent Unable to obtain consent due to emergent nature of procedure.   Anesthesia Versed, Rocuronium, and ketamine   Time Out Verified patient identification, verified procedure, site/side was marked, verified correct patient position, special equipment/implants available, medications/allergies/relevant history reviewed, required imaging and test results available.   Sterile Technique Usual hand hygeine, masks, and gloves were used   Procedure Description Patient positioned in bed supine.  Sedation given as noted above.  Patient was intubated with endotracheal tube using Glidescope.  View was Grade 1 full glottis .  Number of attempts was 1.  Colorimetric CO2 detector was consistent with tracheal placement.   Complications/Tolerance None; patient tolerated the procedure well. Chest X-ray is ordered to verify placement.   EBL Minimal   Specimen(s) None  JD Anselm [facility] 10/26/2023, 3:30 AM  Please see [email or web address] for pager details.  From 7A-7P if no response, please call [phone]. After hours, please call ELink [phone].

## 2023-10-26 NOTE — Progress Notes (Signed)
Date and time results received: 10/26/23 0548   Test: serum osmolality  Critical Value: 335  Name of Provider Notified: Hilton Cork RN  [facility] RN to notify Dr. Delia Chimes of result.

## 2023-10-27 ENCOUNTER — Inpatient Hospital Stay (HOSPITAL_COMMUNITY): Payer: Medicare Other

## 2023-10-27 DIAGNOSIS — R6521 Severe sepsis with septic shock: Secondary | ICD-10-CM | POA: Diagnosis not present

## 2023-10-27 DIAGNOSIS — R578 Other shock: Secondary | ICD-10-CM | POA: Diagnosis not present

## 2023-10-27 DIAGNOSIS — R739 Hyperglycemia, unspecified: Secondary | ICD-10-CM | POA: Diagnosis not present

## 2023-10-27 DIAGNOSIS — E43 Unspecified severe protein-calorie malnutrition: Secondary | ICD-10-CM | POA: Insufficient documentation

## 2023-10-27 DIAGNOSIS — A419 Sepsis, unspecified organism: Secondary | ICD-10-CM | POA: Diagnosis not present

## 2023-10-27 LAB — ECHOCARDIOGRAM COMPLETE
Area-P 1/2: 2.49 cm2
Height: 71 in
S' Lateral: 3 cm
Single Plane A4C EF: 59.5 %
Weight: 1774.26 [oz_av]

## 2023-10-27 LAB — GLUCOSE, CAPILLARY
Glucose-Capillary: 149 mg/dL — ABNORMAL HIGH (ref 70–99)
Glucose-Capillary: 176 mg/dL — ABNORMAL HIGH (ref 70–99)
Glucose-Capillary: 188 mg/dL — ABNORMAL HIGH (ref 70–99)
Glucose-Capillary: 224 mg/dL — ABNORMAL HIGH (ref 70–99)
Glucose-Capillary: 346 mg/dL — ABNORMAL HIGH (ref 70–99)
Glucose-Capillary: 260 mg/dL — ABNORMAL HIGH (ref 70–99)

## 2023-10-27 LAB — LACTIC ACID, PLASMA: Lactic Acid, Venous: 2.4 mmol/L (ref 0.5–1.9)

## 2023-10-27 LAB — CULTURE, OB URINE

## 2023-10-27 LAB — BASIC METABOLIC PANEL
Anion gap: 12 (ref 5–15)
BUN: 51 mg/dL — ABNORMAL HIGH (ref 8–23)
CO2: 21 mmol/L — ABNORMAL LOW (ref 22–32)
Calcium: 9.4 mg/dL (ref 8.9–10.3)
Chloride: 116 mmol/L — ABNORMAL HIGH (ref 98–111)
Creatinine, Ser: 1.93 mg/dL — ABNORMAL HIGH (ref 0.61–1.24)
GFR, Estimated: 38 mL/min — ABNORMAL LOW (ref >60)
Glucose, Bld: 177 mg/dL — ABNORMAL HIGH (ref 70–99)
Potassium: 3.4 mmol/L — ABNORMAL LOW (ref 3.5–5.1)
Sodium: 149 mmol/L — ABNORMAL HIGH (ref 135–145)

## 2023-10-27 LAB — TRIGLYCERIDES: Triglycerides: 116 mg/dL (ref <150)

## 2023-10-27 LAB — PHOSPHORUS: Phosphorus: 1.7 mg/dL — ABNORMAL LOW (ref 2.5–4.6)

## 2023-10-27 LAB — HEMOGLOBIN A1C
Hgb A1c MFr Bld: 14.2 % — ABNORMAL HIGH (ref 4.8–5.6)
Mean Plasma Glucose: 361 mg/dL

## 2023-10-27 LAB — MAGNESIUM: Magnesium: 2.2 mg/dL (ref 1.7–2.4)

## 2023-10-27 MED ORDER — POTASSIUM PHOSPHATES 15 MMOLE/5ML IV SOLN
30.0000 mmol | Freq: Once | INTRAVENOUS | Status: AC
  Administered 2023-10-27: 30 mmol via INTRAVENOUS
  Filled 2023-10-27: qty 10

## 2023-10-27 MED ORDER — FENTANYL 2500MCG IN NS 250ML (10MCG/ML) PREMIX INFUSION
25.0000 ug/h | INTRAVENOUS | Status: DC
  Administered 2023-10-27 (×2): 25 ug/h via INTRAVENOUS
  Filled 2023-10-27: qty 250

## 2023-10-27 MED ORDER — FENTANYL BOLUS VIA INFUSION
25.0000 ug | INTRAVENOUS | Status: DC | PRN
  Administered 2023-10-27 (×3): 100 ug via INTRAVENOUS

## 2023-10-27 MED ORDER — THIAMINE MONONITRATE 100 MG PO TABS
100.0000 mg | ORAL_TABLET | Freq: Every day | ORAL | Status: DC
  Administered 2023-10-27 – 2023-11-02 (×6): 100 mg
  Filled 2023-10-27 (×6): qty 1

## 2023-10-27 MED ORDER — POTASSIUM CHLORIDE 20 MEQ PO PACK
40.0000 meq | PACK | Freq: Once | ORAL | Status: AC
  Administered 2023-10-27: 40 meq
  Filled 2023-10-27: qty 2

## 2023-10-27 MED ORDER — ACETAMINOPHEN 160 MG/5ML PO SOLN
650.0000 mg | Freq: Four times a day (QID) | ORAL | Status: DC | PRN
  Administered 2023-10-27 – 2023-11-01 (×5): 650 mg via ORAL
  Filled 2023-10-27 (×6): qty 20.3

## 2023-10-27 MED ORDER — FENTANYL CITRATE PF 50 MCG/ML IJ SOSY: 25.0000 ug | PREFILLED_SYRINGE | Freq: Once | INTRAMUSCULAR | Status: DC

## 2023-10-27 MED ORDER — VITAL AF 1.2 CAL PO LIQD
1000.0000 mL | ORAL | Status: DC
  Administered 2023-10-27: 1000 mL

## 2023-10-27 MED ORDER — ACETAMINOPHEN 650 MG RE SUPP: 650.0000 mg | Freq: Four times a day (QID) | RECTAL | Status: DC | PRN

## 2023-10-27 MED ORDER — DEXMEDETOMIDINE HCL IN NACL 400 MCG/100ML IV SOLN
0.0000 ug/kg/h | INTRAVENOUS | Status: DC
  Administered 2023-10-27: 0.4 ug/kg/h via INTRAVENOUS
  Administered 2023-10-28: 0.5 ug/kg/h via INTRAVENOUS
  Filled 2023-10-27 (×2): qty 100

## 2023-10-27 NOTE — Plan of Care (Signed)
  Problem: Education: Goal: Knowledge of General Education information will improve Description: Including pain rating scale, medication(s)/side effects and non-pharmacologic comfort measures Outcome: Progressing   Problem: Health Behavior/Discharge Planning: Goal: Ability to manage health-related needs will improve Outcome: Progressing   Problem: Clinical Measurements: Goal: Ability to maintain clinical measurements within normal limits will improve Outcome: Progressing Goal: Will remain free from infection Outcome: Progressing Goal: Diagnostic test results will improve Outcome: Progressing Goal: Respiratory complications will improve Outcome: Progressing Goal: Cardiovascular complication will be avoided Outcome: Progressing   Problem: Activity: Goal: Risk for activity intolerance will decrease Outcome: Progressing   Problem: Nutrition: Goal: Adequate nutrition will be maintained Outcome: Progressing   Problem: Coping: Goal: Level of anxiety will decrease Outcome: Progressing   Problem: Elimination: Goal: Will not experience complications related to bowel motility Outcome: Progressing Goal: Will not experience complications related to urinary retention Outcome: Progressing   Problem: Pain Management: Goal: General experience of comfort will improve Outcome: Progressing   Problem: Safety: Goal: Ability to remain free from injury will improve Outcome: Progressing   Problem: Skin Integrity: Goal: Risk for impaired skin integrity will decrease Outcome: Progressing   Problem: Education: Goal: Ability to describe self-care measures that may prevent or decrease complications (Diabetes Survival Skills Education) will improve Outcome: Progressing Goal: Individualized Educational Video(s) Outcome: Progressing   Problem: Coping: Goal: Ability to adjust to condition or change in health will improve Outcome: Progressing   Problem: Fluid Volume: Goal: Ability to  maintain a balanced intake and output will improve Outcome: Progressing   Problem: Health Behavior/Discharge Planning: Goal: Ability to identify and utilize available resources and services will improve Outcome: Progressing Goal: Ability to manage health-related needs will improve Outcome: Progressing   Problem: Metabolic: Goal: Ability to maintain appropriate glucose levels will improve Outcome: Progressing   Problem: Nutritional: Goal: Maintenance of adequate nutrition will improve Outcome: Progressing Goal: Progress toward achieving an optimal weight will improve Outcome: Progressing   Problem: Skin Integrity: Goal: Risk for impaired skin integrity will decrease Outcome: Progressing   Problem: Tissue Perfusion: Goal: Adequacy of tissue perfusion will improve Outcome: Progressing   Problem: Activity: Goal: Ability to tolerate increased activity will improve Outcome: Progressing   Problem: Respiratory: Goal: Ability to maintain a clear airway and adequate ventilation will improve Outcome: Progressing   Problem: Role Relationship: Goal: Method of communication will improve Outcome: Progressing

## 2023-10-27 NOTE — Progress Notes (Signed)
[facility] ADULT ICU REPLACEMENT PROTOCOL   The patient does apply for the [facility] Adult ICU Electrolyte Replacment Protocol based on the criteria listed below:   1.Exclusion criteria: TCTS, ECMO, Dialysis, and Myasthenia Gravis patients 2. Is GFR >/= 30 ml/min? Yes.    Patient's GFR today is 38 3. Is SCr </= 2? Yes.   Patient's SCr is 1.93 mg/dL 4. Did SCr increase >/= 0.5 in 24 hours? No. 5.Pt's weight >40kg  Yes.   6. Abnormal electrolyte(s): K  7. Electrolytes replaced per protocol 8.  Call MD STAT for K+ </= 2.5, Phos </= 1, or Mag </= 1 Physician:  Thersa Salt Suraj Ramdass 10/27/2023 4:21 AM

## 2023-10-27 NOTE — Progress Notes (Signed)
NAME:  [name], MRN:  [id number], DOB:  04/13/1956, LOS: 2 ADMISSION DATE:  10/25/2023 History of Present Illness:  67 year old male patient with a past medical history of insulin-dependent type 2 diabetes mellitus, hypertension, HLD frail and cachectic who presented to [facility] on 12/14 for altered mental status.  History taken mainly from chart dissection.  Unable to reach his ex-wife.  Seems like he presented on 12/14 with altered mental status, brought in by his ex-wife.  Was found to be in HHS with hyperglycemia more than 800.  No signs of DKA, urine negative for ketones.  Labs initially with leukocytosis with a WBC count of 20.7 with neutrophilic predominance.  H&H stable.  BMP with elevated potassium at 6.1.  AKI with creatinine 2.9 mg/dL from a baseline of 4.09 mg/dL.  Anion gap 18.  UA showing large leukocyte and many bacteria.  Therefore he was thought to have UTI leading to HHS.  He was started on ceftriaxone.  Insulin sliding scale and long acting.  Unfortunately course complicated by hypoxic respiratory failure over the course of the day with increased oxygen requirement to 15 L nasal cannula.  Noted to have extensive thick secretions.  On encounter he was not able to protect his airways with noticeable stridor.  Therefore the decision was made to intubate patient.  Bronchoscopy followed which showed extensive purulent secretions.  Antibiotics were broadened.  Surprisingly chest x-ray without any parenchymal lung disease.  Pertinent  Medical History   Past Medical History:  Diagnosis Date   COPD (chronic obstructive pulmonary disease) (HCC)    Diabetes mellitus without complication (HCC)    Hypertension     Significant Hospital Events: Including procedures, antibiotic start and stop dates in addition to other pertinent events   12/15 admitted to the ICU with acute hypoxic resp failure. Intubated.  12/16 no issues overnight, remains intubated and sedated.  Off  pressors  Objective   Blood pressure (!) 162/116, pulse 89, temperature (!) 100.7 F (38.2 C), temperature source Axillary, resp. rate 20, height 5\' 11"  (1.803 m), weight 50.3 kg, SpO2 100%.    Vent Mode: PRVC FiO2 (%):  [40 %] 40 % Set Rate:  [26 bmp] 26 bmp Vt Set:  [600 mL] 600 mL PEEP:  [5 cmH20] 5 cmH20 Plateau Pressure:  [17 cmH20-20 cmH20] 20 cmH20   Intake/Output Summary (Last 24 hours) at 10/27/2023 0705 Last data filed at 10/27/2023 0700 Gross per 24 hour  Intake 2486.91 ml  Output 390 ml  Net 2096.91 ml   Filed Weights   10/26/23 0054 10/26/23 0305 10/27/23 0500  Weight: 53.4 kg 51 kg 50.3 kg    Examination: General chronic ill-appearing severely deconditioned cachectic middle-aged male lying in bed on mechanical ventilation in no acute distress HEENT: ETT, MM pink/moist, PERRL,  Neuro: Will Flicker eyes open to verbal stimuli, unable to follow commands CV: s1s2 regular rate and rhythm, no murmur, rubs, or gallops,  PULM: Clear to auscultation bilaterally, no increased work of breathing, no added breath sounds GI: soft, bowel sounds active in all 4 quadrants, non-tender, non-distended, tolerating TF Extremities: warm/dry, no edema  Skin: no rashes or lesions  Resolved problems  HHS  Assessment & Plan:   Acute hypoxic respiratory failure due to CAP with extensive secretions noted on bronchoscopy History of COPD P: Continue ventilator support with lung protective strategies  Wean PEEP and FiO2 for sats greater than 90%. Head of bed elevated 30 degrees. Plateau pressures less than 30 cm  H20.  Follow intermittent chest x-ray and ABG.   SAT/SBT as tolerated, mentation preclude extubation  Ensure adequate pulmonary hygiene  Follow cultures  VAP bundle in place  PAD protocol Continue azithromycin and Zosyn Aspiration precautions Follow cultures  Severe septic shock secondary to CAP and UTI, POA -Presented with tachypnea, tachycardia, positive lactic acid  of 3.9 and a leukocytosis with WBC 20.7 P: Continue azithromycin as above Wean pressors as able for MAP goal greater than 65 Vent support as above  Follow cultures Monitor urine output  Essential hypertension Hyperlipidemia -Home medications include Norvasc, carvedilol, and Lipitor Concern for underlying CHF -Patient was seen with severe hypotension post intubation raising concern for preload dependence in the setting of underlying CHF versus possible PE P: Continuous telemetry  Strict intake and output  Daily weight to assess volume status Follow echo  UTI -Large leukocye and many bacteria seen on UA P: Continue azithromycin as above  Type 2 diabetes with HHS on admission -Home medications include Basaglar P: Continue SSI CBG goal 140-180 CBG checks every 4  AKI  -Cr. 2.7mg /dl from baseline 2.95 mg/fdl likely prerenal/septic ATN.   P: Follow renal function  Monitor urine output Trend Bmet Avoid nephrotoxins Ensure adequate renal perfusion   Polysubstance abuse history including alcohol abuse P: Monitor for signs of withdrawal Nicotine patch as needed  Social determinants of health -Patient is homeless at baseline P: Supportive care TOC consult  Failure to thrive Severe deconditioning Protein calorie malnutrition -Patient's BMI is 15 P: Optimize nutrition as able Protein supplementation Palliative care following  Best Practice (right click and "Reselect all SmartList Selections" daily)   Diet/type: NPO DVT prophylaxis prophylactic heparin  Pressure ulcer(s): N/A GI prophylaxis: H2B Lines: N/A Foley:  N/A Code Status:  full code  CRITICAL CARE Performed by: Hazaiah Edgecombe D. Harris   Total critical care time: 38 minutes  Critical care time was exclusive of separately billable procedures and treating other patients.  Critical care was necessary to treat or prevent imminent or life-threatening deterioration.  Critical care was time spent  personally by me on the following activities: development of treatment plan with patient and/or surrogate as well as nursing, discussions with consultants, evaluation of patient's response to treatment, examination of patient, obtaining history from patient or surrogate, ordering and performing treatments and interventions, ordering and review of laboratory studies, ordering and review of radiographic studies, pulse oximetry and re-evaluation of patient's condition.  Shaheim Mahar D. Harris, NP-C [place] [facility] Personal contact information can be found on Amion  If no contact or response made please call [phone] 10/27/2023, 7:08 AM

## 2023-10-27 NOTE — ED Provider Notes (Signed)
[facility]   [id number] 10/25/23 Arrival Time: 1515  ASSESSMENT & PLAN:  1. Type 2 diabetes mellitus with hyperglycemia, with long-term current use of insulin (HCC)   2. Hypotension, unspecified hypotension type    Ques DKA vs sepsis with mental status changes. Will need hospital admission. To ED via EMS; stable upon discharge.  Labs Reviewed  POCT FASTING CBG KUC MANUAL ENTRY - Abnormal; Notable for the following components:      Result Value   POCT Glucose (KUC) 600 (*)    All other components within normal limits   Reviewed expectations re: course of current medical issues. Questions answered. Outlined signs and symptoms indicating need for more acute intervention. Patient verbalized understanding. After Visit Summary given.   SUBJECTIVE: Difficult history secondary to pt confusion. Unable to answer most of my questions. Mumbles. [name] is a 67 y.o. male with DM/HTN who is brought by distant family member who reports that he is homeless. He and his wife have tried to help feed and care for [name]. Has noted drastic physical decline. Not very communicative. Eating much less. Generally restless. This is about all the history I can ascertain.   OBJECTIVE:  Vitals:   10/25/23 1532  BP: 93/67  Pulse: 76  Resp: 18  Temp: 98.4 F (36.9 C)  TempSrc: Temporal  SpO2: (!) 89%    General appearance: disheveled Eyes: PERRLA; EOMI; conjunctivae normal HENT: normocephalic; atraumatic; oral mucosa dry Neck: supple  Lungs: clear to auscultation bilaterally; unlabored Heart: regular rate and rhythm without murmer Abdomen: soft Back: no CVA tenderness Extremities: symmetrical with no gross deformities Skin: cool and dry  Labs: Results for orders placed or performed during the hospital encounter of 10/25/23  POCT CBG (manual entry)   Collection Time: 10/25/23  3:39 PM  Result Value Ref Range   POCT Glucose (KUC) 600 (A) 70 - 99 mg/dL   Labs Reviewed   POCT FASTING CBG KUC MANUAL ENTRY - Abnormal; Notable for the following components:      Result Value   POCT Glucose (KUC) 600 (*)    All other components within normal limits    Imaging: VAS [place] LOWER EXTREMITY VENOUS (DVT) Result Date: 10/27/2023  Lower Venous DVT Study Patient Name:  [name]  Date of Exam:   10/26/2023 Medical Rec #: [id number]      Accession #:    [id number] Date of Birth: 07-13-1956      Patient Gender: M Patient Age:   20 years Exam Location:  [facility] Procedure:      VAS [place] LOWER EXTREMITY VENOUS (DVT) Referring Phys: Janann Colonel --------------------------------------------------------------------------------  Indications: Hyperglycemia (>800), hypoxic respiratory failure, UTI,.  Limitations: Patient awake, but altered on ventilator, constantly moving, and cachexia. Comparison Study: No prior study on file Performing Technologist: Sherren Kerns RVS  Examination Guidelines: A complete evaluation includes B-mode imaging, spectral Doppler, color Doppler, and power Doppler as needed of all accessible portions of each vessel. Bilateral testing is considered an integral part of a complete examination. Limited examinations for reoccurring indications may be performed as noted. The reflux portion of the exam is performed with the patient in reverse Trendelenburg.  +---------+---------------+---------+-----------+----------+---------------+ RIGHT    CompressibilityPhasicitySpontaneityPropertiesThrombus Aging  +---------+---------------+---------+-----------+----------+---------------+ CFV      Full           Yes      Yes                                  +---------+---------------+---------+-----------+----------+---------------+  SFJ      Full                                                         +---------+---------------+---------+-----------+----------+---------------+ FV Prox  Full                                                          +---------+---------------+---------+-----------+----------+---------------+ FV Mid   Full                                                         +---------+---------------+---------+-----------+----------+---------------+ FV Distal                                             patent by color +---------+---------------+---------+-----------+----------+---------------+ PFV      Full                                                         +---------+---------------+---------+-----------+----------+---------------+ POP      Full           Yes      Yes                                  +---------+---------------+---------+-----------+----------+---------------+ PTV      Full                                                         +---------+---------------+---------+-----------+----------+---------------+ PERO     Full                                                         +---------+---------------+---------+-----------+----------+---------------+ Gastroc  Full                                                         +---------+---------------+---------+-----------+----------+---------------+   +---------+---------------+---------+-----------+----------+--------------+ LEFT     CompressibilityPhasicitySpontaneityPropertiesThrombus Aging +---------+---------------+---------+-----------+----------+--------------+ CFV      Full           Yes      Yes                                 +---------+---------------+---------+-----------+----------+--------------+  SFJ      Full                                                        +---------+---------------+---------+-----------+----------+--------------+ FV Prox  Full                                                        +---------+---------------+---------+-----------+----------+--------------+ FV Mid   Full                                                         +---------+---------------+---------+-----------+----------+--------------+ FV DistalFull                                                        +---------+---------------+---------+-----------+----------+--------------+ PFV      Full                                                        +---------+---------------+---------+-----------+----------+--------------+ POP      Full           Yes      Yes                                 +---------+---------------+---------+-----------+----------+--------------+ PTV      Full                                                        +---------+---------------+---------+-----------+----------+--------------+ PERO     Full                                                        +---------+---------------+---------+-----------+----------+--------------+     Summary: BILATERAL: - No evidence of deep vein thrombosis seen in the lower extremities, bilaterally. -No evidence of popliteal cyst, bilaterally.   *See table(s) above for measurements and observations. Electronically signed by Heath Lark on 10/27/2023 at 7:35:18 AM.    Final    DG CHEST PORT 1 VIEW Result Date: 10/26/2023 CLINICAL DATA:  Intubation EXAM: PORTABLE CHEST 1 VIEW COMPARISON:  Chest x-ray 10/27/2023 FINDINGS: Endotracheal tube tip is 3.5 cm above the carina. Enteric tube extends below the diaphragm. There stable enlargement of the thoracic aortic arch. The lungs are clear. There is no pleural effusion or pneumothorax. The heart is normal  in size. Osseous structures are stable. IMPRESSION: 1. Endotracheal tube tip is 3.5 cm above the carina. 2. Enteric tube extends below the diaphragm. 3. Stable enlargement of the thoracic aortic arch. Electronically Signed   By: Darliss Cheney M.D.   On: 10/26/2023 03:40   DG Chest Port 1 View Result Date: 10/26/2023 CLINICAL DATA:  Hypoxia EXAM: PORTABLE CHEST 1 VIEW COMPARISON:  CT chest 09/30/2023 FINDINGS: Again seen is aneurysmal  dilatation of the distal arch of the aorta. Heart size is within normal limits. There is no focal lung infiltrate, pleural effusion or pneumothorax. No acute fractures are seen. IMPRESSION: 1. No active disease. 2. 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 Electronically Signed   By: Darliss Cheney M.D.   On: 10/26/2023 00:58   DG Chest Port 1 View Result Date: 10/26/2023 CLINICAL DATA:  Cough, best possible images due to patient condition. EXAM: PORTABLE CHEST 1 VIEW COMPARISON:  10/25/2023 at 5:51 p.m. FINDINGS: Stable cardiomediastinal silhouette. Marked dilation of the aortic knob similar to prior. Aortic atherosclerotic calcification. No focal consolidation, pleural effusion, or pneumothorax. No displaced rib fractures. IMPRESSION: 1. No change from earlier today. Marked dilation of the aortic knob. Electronically Signed   By: Minerva Fester M.D.   On: 10/26/2023 00:01   DG Chest Portable 1 View Result Date: 10/25/2023 CLINICAL DATA:  Cough, COPD. EXAM: PORTABLE CHEST 1 VIEW COMPARISON:  09/29/2023. FINDINGS: Normal cardiac silhouette. Massively dilated aortic arch. Lungs are clear. No pneumothorax or pleural effusion. Osseous structures are grossly intact. IMPRESSION: Massively dilated aortic arch. No definite interval change radiographically, but CT would be more definitive. Electronically Signed   By: Layla Maw M.D.   On: 10/25/2023 18:53    Allergies  Allergen Reactions   Codeine Phosphate     REACTION: unspecified    Past Medical History:  Diagnosis Date   COPD (chronic obstructive pulmonary disease) (HCC)    Diabetes mellitus without complication (HCC)    Hypertension    Social History   Socioeconomic History   Marital status: Legally Separated    Spouse name: Not on file   Number of children: Not on file   Years of education: Not on file   Highest education level: Not on file  Occupational History   Not on file  Tobacco Use   Smoking status:  Every Day    Current packs/day: 1.00    Types: Cigarettes   Smokeless tobacco: Never  Substance and Sexual Activity   Alcohol use: Not Currently   Drug use: Not Currently    Types: Marijuana   Sexual activity: Not Currently  Other Topics Concern   Not on file  Social History Narrative   Not on file   Social Drivers of Health   Financial Resource Strain: High Risk (08/30/2022)   Received from [facility], [facility]   Overall Financial Resource Strain (CARDIA)    Difficulty of Paying Living Expenses: Very hard  Food Insecurity: Patient Unable To Answer (10/26/2023)   Hunger Vital Sign    Worried About Running Out of Food in the Last Year: Patient unable to answer    Ran Out of Food in the Last Year: Patient unable to answer  Transportation Needs: Patient Unable To Answer (10/26/2023)   PRAPARE - Transportation    Lack of Transportation (Medical): Patient unable to answer    Lack of Transportation (Non-Medical): Patient unable to answer  Physical Activity: Not on file  Stress: Not  on file  Social Connections: Not on file  Intimate Partner Violence: Patient Unable To Answer (10/26/2023)   Humiliation, Afraid, Rape, and Kick questionnaire    Fear of Current or Ex-Partner: Patient unable to answer    Emotionally Abused: Patient unable to answer    Physically Abused: Patient unable to answer    Sexually Abused: Patient unable to answer   No family history on file. No past surgical history on file.   Mardella Layman, MD 10/27/23 1010

## 2023-10-27 NOTE — TOC Initial Note (Signed)
Transition of Care [facility]) - Initial/Assessment Note    Patient Details  Name: [name] MRN: [id number] Date of Birth: [birth year]  [facility] CM/SW Contact:    Elliot Cousin, RN Phone Number: [phone] 10/27/2023, 4:56 PM  Clinical Narrative:                CM spoke to pt's ex-wife. Pt was living in the home with her and her husband. She plans to be at the meeting for palliative care goals of care on Wednesday, 1 pm. States his Doreene Adas is coming in from [place].   Expected Discharge Plan: Home/Self Care Barriers to Discharge: Continued Medical Work up   Patient Goals and CMS Choice            Expected Discharge Plan and Services   Discharge Planning Services: CM Consult   Living arrangements for the past 2 months: Single Family Home                                      Prior Living Arrangements/Services Living arrangements for the past 2 months: Single Family Home Lives with:: Relatives   Do you feel safe going back to the place where you live?: Yes               Activities of Daily Living      Permission Sought/Granted Permission sought to share information with : Case Manager, Family Supports    Share Information with NAME: Sharol Roussel     Permission granted to share info w Relationship: ex-wife  Permission granted to share info w Contact Information: [phone]  Emotional Assessment   Attitude/Demeanor/Rapport: Intubated (Following Commands or Not Following Commands)          Admission diagnosis:  Hyperglycemia [R73.9] Altered mental status, unspecified altered mental status type [R41.82] Sepsis, due to unspecified organism, unspecified whether acute organ dysfunction present (HCC) [A41.9] Hyperosmolar hyperglycemic state (HHS) (HCC) [E11.00] Patient Active Problem List   Diagnosis Date Noted   Protein-calorie malnutrition, severe 10/27/2023   Acute metabolic encephalopathy 10/26/2023   AKI (acute  kidney injury) (HCC) 10/26/2023   Severe sepsis (HCC) 10/26/2023   Acute cystitis 10/26/2023   Dehydration 10/26/2023   Acute prerenal azotemia 10/26/2023   Elevated troponin 10/26/2023   Acute respiratory failure with hypoxia (HCC) 10/26/2023   Hyperglycemia 10/25/2023   Medical non-compliance 08/15/2021   Thoracic aortic aneurysm (HCC) 08/15/2021   Visit for suture removal 08/15/2021   Overgrown toenails 01/02/2021   Suspected COVID-19 virus infection 09/04/2020   Nipple tenderness 12/21/2019   Hypertensive urgency 08/27/2018   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 [place] [address] Phone: [phone] Fax: [phone]  [facility] [address] [place] [address] Phone: [phone] Fax: [phone]     Social Drivers of Health (SDOH) Social History: SDOH Screenings   Food Insecurity: Patient Unable To Answer (  10/26/2023)  Housing: Patient Unable To Answer (10/26/2023)  Transportation Needs: Patient Unable To Answer (10/26/2023)  Utilities: Patient Unable To Answer (10/26/2023)  Financial Resource Strain: High Risk (08/30/2022)   Received from [facility], [facility]  Tobacco Use: High Risk (10/25/2023)   SDOH Interventions: Food Insecurity Interventions: Patient Unable to Answer Housing Interventions: Patient Unable to Answer Utilities Interventions: Intervention Not Indicated, Patient Unable to  Answer   Readmission Risk Interventions     No data to display

## 2023-10-27 NOTE — Progress Notes (Signed)
Patient ID: [name], male   DOB: 1955/11/15, 67 y.o.   MRN: [id number]    Progress Note from the Palliative Medicine Team at [facility]   Patient Name: [name]        Date: 10/27/2023 DOB: Jun 09, 1956  Age: 67 y.o. MRN#: [id number] Attending Physician: Cheri Fowler, MD Primary Care Physician: [facility] Admit Date: 10/25/2023   I was able to speak to patient's ex-wife [name] by telephone.   She has agreed to be Forensic scientist, being a person with an established relationship with the patient who is acting in good faith and can reliably convey the wishes of the patient..  She has  spoken in detail with CCM.  She hopes for more time to allow for outcomes.  Agrees to meet with me on Wednesday, December 18 at 1 PM for detailed goals of care meeting  No charge  Lorinda Creed NP  Palliative Medicine Team Team Phone # [phone] Pager [phone]

## 2023-10-27 NOTE — Progress Notes (Signed)
TOC received consult for patient. RN confirmed with CSW that patients point of contact was identified. CSW will clear consult. Please reconsult TOC for any additional patient needs.

## 2023-10-27 NOTE — Progress Notes (Signed)
Echocardiogram 2D Echocardiogram has been performed.  Warren Lacy Dontravious Camille RDCS 10/27/2023, 9:58 AM

## 2023-10-27 NOTE — IPAL (Signed)
  Interdisciplinary Goals of Care Family Meeting   Date carried out: 10/27/2023  Location of the meeting: Phone conference  Member's involved: Nurse Practitioner, Bedside Registered Nurse, and Family Member or next of kin  Durable Power of Attorney or acting medical decision maker: Ex-wife, [name]     Discussion: Reviewed chart and contacted emergency contact listed [name].  She confirms that she is the ex wife of patient [name].  She stated that she and [name] have remained friends and in close contact since separation. [name] states she has not seen [name] in close to 2 years.    When questioned who would be [name]'s decision-maker she reports that she takes on this responsibility despite the fact that she and [name] have 1 biological child together, [name].  [name] states that [name] struggles with substance abuse and has verbally defer decision making to her.  At this time [name] would like to continue aggressive interventions to allow [name] time to hopefully recover.  She is aware that he is severely deconditioned with a likely long road to recovery if he is able to successfully extubated.  Code status: Full Code  Disposition: Continue current acute care   Time spent for the meeting: 35 mins  [name] 10/27/2023, 2:01 PM

## 2023-10-27 NOTE — Progress Notes (Signed)
Initial Nutrition Assessment  DOCUMENTATION CODES:   Severe malnutrition in context of chronic illness, Underweight  INTERVENTION:   Recommend considering Cortrak  Tube Feeding via OG:  Vital AF 1.2 at 60 - Recommend slow titration to goal given refeeding risk Titrate by 10 mL q 12 hours until goal rate of 60 ml/hr TF at goal rate provides 1728 kcals, 108 g of protein and 1166 mL of free water  Current free water flush of 200 mL q 6 hours plus TF at goal provides 1966 mL of free water. Hypernatremia improving, monitor.   Added Thiamine 100 mg per tube x 7 days  Monitor magnesium, potassium, and phosphorus BID for at least 3 days, MD to replete as needed, as pt is at risk for refeeding syndrome given severe malnutrition, suspected poor oral intake, underweight.    NUTRITION DIAGNOSIS:   Severe Malnutrition related to chronic illness as evidenced by severe muscle depletion, severe fat depletion.   GOAL:   Patient will meet greater than or equal to 90% of their needs  MONITOR:   Vent status, Labs, Weight trends, TF tolerance, Skin  REASON FOR ASSESSMENT:   Ventilator, Consult Enteral/tube feeding initiation and management  ASSESSMENT:   67 yo male admitted with severe hyperglycemia without DKA, CBG 800, septic shock due to acute UTI and acute respiratory failure secondary to aspiration pneumonia. PMH includes DM, HTN.  12/14 Admitted 12/15 Intubated, Trickle TF initiated  Pt remains on vent support   OG tube dislodged per RN this AM, OG re-advanced and abd xray obtained.   Pivot 1.5 at 20 ml/hr initiated yesterday per Adult TF protocol. Pt tolerating trickles thus far; noted phosphorus and magnesium not rechecked post TF initiation. Given high risk for refeeding, phos and mag added on to AM labs. Phosphorus returned at 1.7, MD notified and plan to supplement  Hypernatremic; noted free water flush 200 mL q 6 hours   +diarrhea, noted FMS in place. Per chart review,  pt reported diarrhea prior to admission  Current wt 50.3 kg; noted wt loss trend per weight encounters.   CBGs 149-204 (goal 140-180); uncontrolled DM at baseline Lab Results  Component Value Date   HGBA1C 14.2 (H) 10/25/2023   HGBA1C 5.5 08/25/2018   Noted pt with hx of polysubstance abuse hx including alcohol  Labs: sodium 149 (H), potassium 3.4 (L), phosphorus 1.7 (L), BUN 51 (H), Creatinine 1.93 (H) Meds: ss novolog, semglee   NUTRITION - FOCUSED PHYSICAL EXAM:  Flowsheet Row Most Recent Value  Orbital Region Severe depletion  Upper Arm Region Severe depletion  Thoracic and Lumbar Region Severe depletion  Buccal Region Unable to assess  Temple Region Severe depletion  Clavicle Bone Region Severe depletion  Clavicle and Acromion Bone Region Severe depletion  Scapular Bone Region Severe depletion  Dorsal Hand Unable to assess  Patellar Region Severe depletion  Anterior Thigh Region Severe depletion  Posterior Calf Region Severe depletion  Edema (RD Assessment) None       Diet Order:   Diet Order             Diet NPO time specified  Diet effective now                   EDUCATION NEEDS:   Not appropriate for education at this time  Skin:  Skin Assessment: Reviewed RN Assessment  Last BM:  12/16 via FMS  Height:   Ht Readings from Last 1 Encounters:  10/25/23 5\' 11"  (1.803 m)  Weight:   Wt Readings from Last 1 Encounters:  10/27/23 50.3 kg     BMI:  Body mass index is 15.47 kg/m.  Estimated Nutritional Needs:   Kcal:  1700-1900 kcals  Protein:  100-115 g  Fluid:  >/= 2L   Romelle Starcher MS, RDN, LDN, CNSC Registered Dietitian 3 Clinical Nutrition RD Inpatient Contact Info in Amion

## 2023-10-27 NOTE — Progress Notes (Signed)
eLink Physician-Brief Progress Note Patient Name: [name] DOB: Jul 20, 1956 MRN: [id number]   Date of Service  10/27/2023  HPI/Events of Note  67 year old male patient with a past medical history of insulin-dependent type 2 diabetes mellitus, hypertension, HLD frail and cachectic who presented to [facility] on 12/14 for altered mental status.   Unable to maintain RASS with Propofol. Significant improvement with Fentanyl pushes - requiring them frequently  eICU Interventions  Initiate fentanyl drip     Intervention Category Minor Interventions: Agitation / anxiety - evaluation and management  [name] 10/27/2023, 4:08 AM

## 2023-10-28 ENCOUNTER — Inpatient Hospital Stay (HOSPITAL_COMMUNITY): Payer: Medicare Other

## 2023-10-28 DIAGNOSIS — R6521 Severe sepsis with septic shock: Secondary | ICD-10-CM | POA: Diagnosis not present

## 2023-10-28 DIAGNOSIS — A419 Sepsis, unspecified organism: Secondary | ICD-10-CM | POA: Diagnosis not present

## 2023-10-28 DIAGNOSIS — R739 Hyperglycemia, unspecified: Secondary | ICD-10-CM | POA: Diagnosis not present

## 2023-10-28 DIAGNOSIS — J9601 Acute respiratory failure with hypoxia: Secondary | ICD-10-CM | POA: Diagnosis not present

## 2023-10-28 LAB — CULTURE, RESPIRATORY W GRAM STAIN: Culture: NORMAL

## 2023-10-28 LAB — BASIC METABOLIC PANEL
Anion gap: 10 (ref 5–15)
Anion gap: 7 (ref 5–15)
BUN: 38 mg/dL — ABNORMAL HIGH (ref 8–23)
BUN: 37 mg/dL — ABNORMAL HIGH (ref 8–23)
CO2: 20 mmol/L — ABNORMAL LOW (ref 22–32)
CO2: 24 mmol/L (ref 22–32)
Calcium: 8.5 mg/dL — ABNORMAL LOW (ref 8.9–10.3)
Calcium: 8.6 mg/dL — ABNORMAL LOW (ref 8.9–10.3)
Chloride: 117 mmol/L — ABNORMAL HIGH (ref 98–111)
Chloride: 115 mmol/L — ABNORMAL HIGH (ref 98–111)
Creatinine, Ser: 1.67 mg/dL — ABNORMAL HIGH (ref 0.61–1.24)
Creatinine, Ser: 1.82 mg/dL — ABNORMAL HIGH (ref 0.61–1.24)
GFR, Estimated: 45 mL/min — ABNORMAL LOW (ref >60)
GFR, Estimated: 41 mL/min — ABNORMAL LOW (ref >60)
Glucose, Bld: 187 mg/dL — ABNORMAL HIGH (ref 70–99)
Glucose, Bld: 175 mg/dL — ABNORMAL HIGH (ref 70–99)
Potassium: 3.2 mmol/L — ABNORMAL LOW (ref 3.5–5.1)
Potassium: 4.1 mmol/L (ref 3.5–5.1)
Sodium: 147 mmol/L — ABNORMAL HIGH (ref 135–145)
Sodium: 146 mmol/L — ABNORMAL HIGH (ref 135–145)

## 2023-10-28 LAB — GLUCOSE, CAPILLARY
Glucose-Capillary: 205 mg/dL — ABNORMAL HIGH (ref 70–99)
Glucose-Capillary: 201 mg/dL — ABNORMAL HIGH (ref 70–99)
Glucose-Capillary: 201 mg/dL — ABNORMAL HIGH (ref 70–99)
Glucose-Capillary: 209 mg/dL — ABNORMAL HIGH (ref 70–99)
Glucose-Capillary: 206 mg/dL — ABNORMAL HIGH (ref 70–99)
Glucose-Capillary: 185 mg/dL — ABNORMAL HIGH (ref 70–99)

## 2023-10-28 LAB — CBC
HCT: 32 % — ABNORMAL LOW (ref 39.0–52.0)
Hemoglobin: 10.6 g/dL — ABNORMAL LOW (ref 13.0–17.0)
MCH: 30.8 pg (ref 26.0–34.0)
MCHC: 33.1 g/dL (ref 30.0–36.0)
MCV: 93 fL (ref 80.0–100.0)
Platelets: 87 10*3/uL — ABNORMAL LOW (ref 150–400)
RBC: 3.44 MIL/uL — ABNORMAL LOW (ref 4.22–5.81)
RDW: 14 % (ref 11.5–15.5)
WBC: 16.3 10*3/uL — ABNORMAL HIGH (ref 4.0–10.5)
nRBC: 0.1 % (ref 0.0–0.2)

## 2023-10-28 LAB — MAGNESIUM: Magnesium: 2 mg/dL (ref 1.7–2.4)

## 2023-10-28 LAB — PHOSPHORUS: Phosphorus: 2.1 mg/dL — ABNORMAL LOW (ref 2.5–4.6)

## 2023-10-28 MED ORDER — INSULIN GLARGINE-YFGN 100 UNIT/ML ~~LOC~~ SOLN
8.0000 [IU] | Freq: Every day | SUBCUTANEOUS | Status: DC
  Filled 2023-10-28: qty 0.08

## 2023-10-28 MED ORDER — FOLIC ACID 1 MG PO TABS
1.0000 mg | ORAL_TABLET | Freq: Every day | ORAL | Status: DC
  Administered 2023-10-30 – 2023-11-02 (×4): 1 mg
  Filled 2023-10-28 (×4): qty 1

## 2023-10-28 MED ORDER — SODIUM PHOSPHATES 45 MMOLE/15ML IV SOLN
15.0000 mmol | Freq: Once | INTRAVENOUS | Status: AC
  Administered 2023-10-28: 15 mmol via INTRAVENOUS
  Filled 2023-10-28: qty 5

## 2023-10-28 MED ORDER — NICOTINE 14 MG/24HR TD PT24
14.0000 mg | MEDICATED_PATCH | Freq: Every day | TRANSDERMAL | Status: DC
  Administered 2023-10-28 – 2023-11-02 (×5): 14 mg via TRANSDERMAL
  Filled 2023-10-28 (×5): qty 1

## 2023-10-28 MED ORDER — ACETAMINOPHEN 10 MG/ML IV SOLN
1000.0000 mg | Freq: Four times a day (QID) | INTRAVENOUS | Status: AC
  Administered 2023-10-28 – 2023-10-29 (×4): 1000 mg via INTRAVENOUS
  Filled 2023-10-28 (×3): qty 100

## 2023-10-28 MED ORDER — POTASSIUM CHLORIDE 20 MEQ PO PACK
20.0000 meq | PACK | ORAL | Status: AC
  Administered 2023-10-28 (×2): 20 meq
  Filled 2023-10-28 (×2): qty 1

## 2023-10-28 MED ORDER — ADULT MULTIVITAMIN W/MINERALS CH
1.0000 | ORAL_TABLET | Freq: Every day | ORAL | Status: DC
  Administered 2023-10-30 – 2023-11-02 (×4): 1
  Filled 2023-10-28 (×4): qty 1

## 2023-10-28 MED ORDER — ACETAMINOPHEN 10 MG/ML IV SOLN
1000.0000 mg | Freq: Four times a day (QID) | INTRAVENOUS | Status: DC
  Filled 2023-10-28: qty 100

## 2023-10-28 MED ORDER — ATORVASTATIN CALCIUM 10 MG PO TABS
20.0000 mg | ORAL_TABLET | Freq: Every day | ORAL | Status: DC
  Administered 2023-10-30 – 2023-11-02 (×4): 20 mg
  Filled 2023-10-28 (×4): qty 2

## 2023-10-28 MED ORDER — ORAL CARE MOUTH RINSE
15.0000 mL | OROMUCOSAL | Status: DC
  Administered 2023-10-28 – 2023-11-02 (×17): 15 mL via OROMUCOSAL

## 2023-10-28 MED ORDER — PHENOL 1.4 % MT LIQD
1.0000 | OROMUCOSAL | Status: DC | PRN
  Administered 2023-10-28 – 2023-10-30 (×2): 1 via OROMUCOSAL
  Filled 2023-10-28 (×2): qty 177

## 2023-10-28 MED ORDER — ORAL CARE MOUTH RINSE: 15.0000 mL | OROMUCOSAL | Status: DC | PRN

## 2023-10-28 MED ORDER — POTASSIUM CHLORIDE 10 MEQ/100ML IV SOLN
10.0000 meq | INTRAVENOUS | Status: AC
  Administered 2023-10-28 (×4): 10 meq via INTRAVENOUS
  Filled 2023-10-28 (×4): qty 100

## 2023-10-28 NOTE — Procedures (Signed)
Extubation Procedure Note  Patient Details:   Name: [name] DOB: [birth year] MRN: [id number]   Airway Documentation:    Vent end date: 10/28/23 Vent end time: 0905   Evaluation  O2 sats: stable throughout Complications: No apparent complications Patient did tolerate procedure well. Bilateral Breath Sounds: Clear   Yes  Pt extubated per MD order, placed on 4L [place]. Positive cuff leak heard, no stridor post extubation. RT will continue to monitor.   Vicente Masson 10/28/2023, 9:10 AM

## 2023-10-28 NOTE — Progress Notes (Signed)
NAME:  [name], MRN:  [id number], DOB:  1956-05-18, LOS: 3 ADMISSION DATE:  10/25/2023 History of Present Illness:  67 year old male patient with a past medical history of insulin-dependent type 2 diabetes mellitus, hypertension, HLD frail and cachectic who presented to [facility] on 12/14 for altered mental status.  History taken mainly from chart dissection.  Unable to reach his ex-wife.  Seems like he presented on 12/14 with altered mental status, brought in by his ex-wife.  Was found to be in HHS with hyperglycemia more than 800.  No signs of DKA, urine negative for ketones.  Labs initially with leukocytosis with a WBC count of 20.7 with neutrophilic predominance.  H&H stable.  BMP with elevated potassium at 6.1.  AKI with creatinine 2.9 mg/dL from a baseline of 4.09 mg/dL.  Anion gap 18.  UA showing large leukocyte and many bacteria.  Therefore he was thought to have UTI leading to HHS.  He was started on ceftriaxone.  Insulin sliding scale and long acting.  Unfortunately course complicated by hypoxic respiratory failure over the course of the day with increased oxygen requirement to 15 L nasal cannula.  Noted to have extensive thick secretions.  On encounter he was not able to protect his airways with noticeable stridor.  Therefore the decision was made to intubate patient.  Bronchoscopy followed which showed extensive purulent secretions.  Antibiotics were broadened.  Surprisingly chest x-ray without any parenchymal lung disease.  Pertinent  Medical History   Past Medical History:  Diagnosis Date   COPD (chronic obstructive pulmonary disease) (HCC)    Diabetes mellitus without complication (HCC)    Hypertension     Significant Hospital Events: Including procedures, antibiotic start and stop dates in addition to other pertinent events   12/15: admitted to the ICU with acute hypoxic resp failure. Intubated.  12/16: no issues overnight, remains intubated and sedated.   Off pressors 12/17: extubated to nasal cannula. Urine and resp culture both with polymicrobial growth   Objective   Blood pressure (!) 86/62, pulse 80, temperature 99.8 F (37.7 C), temperature source Oral, resp. rate (!) 24, height 5\' 11"  (1.803 m), weight 56.7 kg, SpO2 98%.    Vent Mode: PRVC FiO2 (%):  [40 %] 40 % Set Rate:  [24 bmp-26 bmp] 24 bmp Vt Set:  [600 mL] 600 mL PEEP:  [5 cmH20] 5 cmH20 Plateau Pressure:  [16 cmH20] 16 cmH20   Intake/Output Summary (Last 24 hours) at 10/28/2023 8119 Last data filed at 10/28/2023 0800 Gross per 24 hour  Intake 2344.57 ml  Output 2770 ml  Net -425.43 ml   Filed Weights   10/26/23 0305 10/27/23 0500 10/28/23 0600  Weight: 51 kg 50.3 kg 56.7 kg    Examination: General: older appearing male, acute on chronically ill appearing, thin, no acute distress  HEENT: [place]/AT, anicteric sclera, dry mucous membranes, ETT, OGT  Neuro: easily arousable to voice, follows commands, strong cough/gag  CV: s1/s2 without murmur, rub, gallop, regular rate and rhythm, no peripheral edema PULM: Clear to auscultation bilaterally, vented  GI: soft, non-distended, does not grimace to palpation, +BS Extremities: warm, no peripheral edema Skin: no rashes or lesions  Resolved problems  HHS Shock  Assessment & Plan:  Acute hypoxic respiratory failure due to CAP with extensive secretions noted on bronchoscopy History of COPD - extubate this AM to [place]  - con't zosyn for  - f/u resp cultures, currently w/ GPR, yeast, GNR, GPC  - con't brovana, yupelri - +  flutter valve, IS  - PT   Sepsis secondary to CAP and UTI, POA Initial presentation with tachypnea, tachycardia, lactic 3.9, wbc 20.7, UA+. Urine culture with multiple species. Bcx negative. Initially on vasopressors, now titrated off.  - con't zosyn  - follow cultures for speciation and susceptibilities  - trend fever, wbc, lactic curve  - trend bmp, uop - repeat CXR with worsening resp status   Acute  Kidney Injury Urinary tract infection; multifactorial. Likely pre-renal in setting of shock and ATN in setting of severe sepsis UTI. sCR on arrival 2.12 > now 1.67  - trend bmp, mag, phos  - close UOP monitoring  - avoid nephrotoxic agents  - renally dose medication  - ensure adequate renal perfusion  - antibiotics as above  Essential hypertension Hyperlipidemia -Home medications include Norvasc, carvedilol, and Lipitor Concern for underlying CHF; Patient was seen with severe hypotension post intubation raising concern for preload dependence in the setting of underlying CHF versus possible PE. Echo 12/16 EF 55-60%, no RWMA, G1DD.  - BP labile so will hold home antihypertensives - restart lipitor when passing swallow  - tele monitoring  - daily weights   Type 2 diabetes with HHS on admission; @ home on basaglar  -Home medications include Basaglar - cbg q4h  - SSI   Polysubstance abuse history including alcohol abuse - monitor for withdrawal, better assess alcohol use once extubated  - thiamine, folic acid, mtvn  - nicotine patch   Social determinants of health, homelessness - Supportive care - TOC consult  Failure to thrive Severe deconditioning Protein calorie malnutrition - optimize nutrition while admitted  - protein supplementation  - palliative care following   Best Practice (right click and "Reselect all SmartList Selections" daily)   Diet/type: NPO DVT prophylaxis prophylactic heparin  Pressure ulcer(s): N/A GI prophylaxis: H2B Lines: N/A Foley:  N/A Code Status:  full code  CRITICAL CARE Performed by: Cristopher Peru  Total critical care time: 35 minutes  Critical care time was exclusive of separately billable procedures and treating other patients.  Critical care was necessary to treat or prevent imminent or life-threatening deterioration.  Critical care was time spent personally by me on the following activities: development of treatment plan with  patient and/or surrogate as well as nursing, discussions with consultants, evaluation of patient's response to treatment, examination of patient, obtaining history from patient or surrogate, ordering and performing treatments and interventions, ordering and review of laboratory studies, ordering and review of radiographic studies, pulse oximetry and re-evaluation of patient's condition.  Cristopher Peru, PA-C [facility] 10/28/23 8:40 AM  Please see [email or web address] for pager details.  From 7A-7P if no response, please call [phone] After hours, please call ELink [phone]

## 2023-10-28 NOTE — Progress Notes (Signed)
[facility] ADULT ICU REPLACEMENT PROTOCOL   The patient does apply for the [facility] Adult ICU Electrolyte Replacment Protocol based on the criteria listed below:   1.Exclusion criteria: TCTS, ECMO, Dialysis, and Myasthenia Gravis patients 2. Is GFR >/= 30 ml/min? Yes.    Patient's GFR today is 45 3. Is SCr </= 2? Yes.   Patient's SCr is 1.67 mg/dL 4. Did SCr increase >/= 0.5 in 24 hours? No. 5.Pt's weight >40kg  Yes.   6. Abnormal electrolyte(s): K 3.2  7. Electrolytes replaced per protocol 8.  Call MD STAT for K+ </= 2.5, Phos </= 1, or Mag </= 1 Physician:    Markus Daft A 10/28/2023 3:28 AM

## 2023-10-28 NOTE — Plan of Care (Signed)
  Problem: Education: Goal: Knowledge of General Education information will improve Description: Including pain rating scale, medication(s)/side effects and non-pharmacologic comfort measures 10/28/2023 0425 by Jorryn Casagrande, Lucienne Capers, RN Outcome: Progressing 10/28/2023 0425 by Vanilla Heatherington, Lucienne Capers, RN Outcome: Progressing   Problem: Health Behavior/Discharge Planning: Goal: Ability to manage health-related needs will improve 10/28/2023 0425 by Yaffa Seckman, Lucienne Capers, RN Outcome: Progressing 10/28/2023 0425 by Ellenie Salome, Lucienne Capers, RN Outcome: Progressing   Problem: Clinical Measurements: Goal: Ability to maintain clinical measurements within normal limits will improve 10/28/2023 0425 by Jaloni Sorber, Lucienne Capers, RN Outcome: Progressing 10/28/2023 0425 by Kiptyn Rafuse, Lucienne Capers, RN Outcome: Progressing Goal: Will remain free from infection 10/28/2023 0425 by Johonna Binette, Lucienne Capers, RN Outcome: Progressing 10/28/2023 0425 by Joshuwa Vecchio, Lucienne Capers, RN Outcome: Progressing Goal: Diagnostic test results will improve 10/28/2023 0425 by Crysten Kaman, Lucienne Capers, RN Outcome: Progressing 10/28/2023 0425 by Walsie Smeltz, Lucienne Capers, RN Outcome: Progressing Goal: Respiratory complications will improve 10/28/2023 0425 by Heywood Tokunaga, Lucienne Capers, RN Outcome: Progressing 10/28/2023 0425 by Sumie Remsen, Lucienne Capers, RN Outcome: Progressing Goal: Cardiovascular complication will be avoided 10/28/2023 0425 by Saje Gallop, Lucienne Capers, RN Outcome: Progressing 10/28/2023 0425 by Nahuel Wilbert, Lucienne Capers, RN Outcome: Progressing   Problem: Activity: Goal: Risk for activity intolerance will decrease 10/28/2023 0425 by Malayah Demuro, Lucienne Capers, RN Outcome: Progressing 10/28/2023 0425 by Jordie Schreur, Lucienne Capers, RN Outcome: Progressing   Problem: Nutrition: Goal: Adequate nutrition will be maintained 10/28/2023 0425 by Shaquill Iseman, Lucienne Capers, RN Outcome: Progressing 10/28/2023 0425 by Aoi Kouns, Lucienne Capers, RN Outcome: Progressing   Problem: Coping: Goal: Level of anxiety will decrease 10/28/2023 0425 by Aylene Acoff, Lucienne Capers, RN Outcome:  Progressing 10/28/2023 0425 by Brunilda Eble, Lucienne Capers, RN Outcome: Progressing   Problem: Elimination: Goal: Will not experience complications related to bowel motility Outcome: Progressing Goal: Will not experience complications related to urinary retention Outcome: Progressing   Problem: Pain Management: Goal: General experience of comfort will improve Outcome: Progressing   Problem: Safety: Goal: Ability to remain free from injury will improve Outcome: Progressing   Problem: Skin Integrity: Goal: Risk for impaired skin integrity will decrease Outcome: Progressing   Problem: Education: Goal: Ability to describe self-care measures that may prevent or decrease complications (Diabetes Survival Skills Education) will improve Outcome: Progressing Goal: Individualized Educational Video(s) Outcome: Progressing   Problem: Coping: Goal: Ability to adjust to condition or change in health will improve Outcome: Progressing   Problem: Fluid Volume: Goal: Ability to maintain a balanced intake and output will improve Outcome: Progressing   Problem: Health Behavior/Discharge Planning: Goal: Ability to identify and utilize available resources and services will improve Outcome: Progressing Goal: Ability to manage health-related needs will improve Outcome: Progressing   Problem: Metabolic: Goal: Ability to maintain appropriate glucose levels will improve Outcome: Progressing   Problem: Nutritional: Goal: Maintenance of adequate nutrition will improve Outcome: Progressing Goal: Progress toward achieving an optimal weight will improve Outcome: Progressing   Problem: Skin Integrity: Goal: Risk for impaired skin integrity will decrease Outcome: Progressing   Problem: Tissue Perfusion: Goal: Adequacy of tissue perfusion will improve Outcome: Progressing   Problem: Activity: Goal: Ability to tolerate increased activity will improve Outcome: Progressing   Problem: Respiratory: Goal:  Ability to maintain a clear airway and adequate ventilation will improve Outcome: Progressing   Problem: Role Relationship: Goal: Method of communication will improve Outcome: Progressing

## 2023-10-28 NOTE — Progress Notes (Signed)
Modified Barium Swallow Study  Patient Details  Name: [name] MRN: [id number] Date of Birth: [birth year]  Today's Date: 10/28/2023  Modified Barium Swallow completed.  Full report located under Chart Review in the Imaging Section.  History of Present Illness [name] is a 67 yo male presenting to ED 12/14 with AMS. Admitted with septic shock due to acute UTI and acute respiratory failure in the setting of PNA (aspiration v community acquired). ETT 12/15-12/17. PMH includes polysubstance abuse, T2DM, HTN, HLD   Clinical Impression Pt presents with a severe oropharyngeal dysphagia characterized by impaired timing and sensation. Suspect pt has dysphagia at baseline that is currently exacerbated secondary to intubation, deconditioning, and cognitive deficits. Pt continues to have audible secretions with poor management. Note significant pharyngeal residue and incomplete laryngeal elevation, resulting in repeated aspiration with weak coughing and subsequent aspiration of previously aspirated material mixed with secretions. He aspirated gross amounts of thin, nectar thick, and honey thick liquids before initiating an ineffective cough. Recommend he remain NPO except for 2-3 ice chips following thorough oral care QID. This will aid in loosening secretions and preserving swallow function as SLP initiates therapy targeting oropharyngeal hygiene and cough strength. Will f/u. Factors that may increase risk of adverse event in presence of aspiration [employer]aks [employer]s 2021): Poor general health and/or compromised immunity;Reduced cognitive function;Frail or deconditioned;Inadequate oral hygiene;Weak cough;Aspiration of thick, dense, and/or acidic materials;Frequent aspiration of large volumes  Swallow Evaluation Recommendations Recommendations: NPO;Alternative means of nutrition - NG Tube;Ice chips PRN after oral care Medication Administration: Via alternative means Oral care recommendations: Oral  care QID (4x/day);Oral care before ice chips/water;Use suctioning for oral care      Gwynneth Aliment, M.A., CF-SLP Speech Language Pathology, Acute Rehabilitation Services  Secure Chat preferred [phone]  10/28/2023,3:12 PM

## 2023-10-28 NOTE — Evaluation (Signed)
Clinical/Bedside Swallow Evaluation Patient Details  Name: [name] MRN: [id number] Date of Birth: 22-May-1956  Today's Date: 10/28/2023 Time: SLP Start Time (ACUTE ONLY): 1121 SLP Stop Time (ACUTE ONLY): 1135 SLP Time Calculation (min) (ACUTE ONLY): 14 min  Past Medical History:  Past Medical History:  Diagnosis Date   COPD (chronic obstructive pulmonary disease) (HCC)    Diabetes mellitus without complication (HCC)    Hypertension    Past Surgical History: History reviewed. No pertinent surgical history. HPI:  [name] is a 67 yo male presenting to ED 12/14 with AMS. Admitted with septic shock due to acute UTI and acute respiratory failure in the setting of PNA (aspiration v community acquired). ETT 12/15-12/17. PMH includes polysubstance abuse, T2DM, HTN, HLD    Assessment / Plan / Recommendation  Clinical Impression  Pt sitting upright with audible secretions and a wet, congested cough. Provided thorough oral care with pt endorsing dental and throat pain. Pt has minimal tooth shards in extremely poor condition. He had immediate wet coughing following trials of ice chips as well as following water via tspn, cup, and straw. Trials of purees were overall WFL. Discussed with RN. Recommend he remain NPO except meds, which he can take crushed in puree, pending completion of an MBS. SLP will follow. SLP Visit Diagnosis: Dysphagia, unspecified (R13.10)    Aspiration Risk  Moderate aspiration risk    Diet Recommendation NPO except meds    Medication Administration: Crushed with puree    Other  Recommendations Oral Care Recommendations: Oral care QID;Staff/trained caregiver to provide oral care    Recommendations for follow up therapy are one component of a multi-disciplinary discharge planning process, led by the attending physician.  Recommendations may be updated based on patient status, additional functional criteria and insurance authorization.  Follow up Recommendations  Other (comment) (TBD pending instrumental study)      Assistance Recommended at Discharge    Functional Status Assessment Patient has had a recent decline in their functional status and demonstrates the ability to make significant improvements in function in a reasonable and predictable amount of time.  Frequency and Duration min 2x/week  2 weeks       Prognosis Prognosis for improved oropharyngeal function: Good Barriers to Reach Goals: Cognitive deficits;Severity of deficits      Swallow Study   General HPI: [name] is a 67 yo male presenting to ED 12/14 with AMS. Admitted with septic shock due to acute UTI and acute respiratory failure in the setting of PNA (aspiration v community acquired). ETT 12/15-12/17. PMH includes polysubstance abuse, T2DM, HTN, HLD Type of Study: Bedside Swallow Evaluation Previous Swallow Assessment: none in chart Diet Prior to this Study: NPO Temperature Spikes Noted: No Respiratory Status: Nasal cannula History of Recent Intubation: Yes Total duration of intubation (days): 2 days Date extubated: 10/28/23 Behavior/Cognition: Alert;Cooperative Oral Cavity Assessment: Within Functional Limits Oral Care Completed by SLP: Yes Oral Cavity - Dentition: Poor condition;Missing dentition Vision: Functional for self-feeding Self-Feeding Abilities: Needs assist Patient Positioning: Upright in bed Baseline Vocal Quality: Hoarse;Wet Volitional Cough: Weak;Wet;Congested Volitional Swallow: Able to elicit    Oral/Motor/Sensory Function Overall Oral Motor/Sensory Function: Within functional limits   Ice Chips Ice chips: Impaired Presentation: Spoon Oral Phase Impairments: Impaired mastication;Reduced lingual movement/coordination Pharyngeal Phase Impairments: Cough - Immediate;Wet Vocal Quality   Thin Liquid Thin Liquid: Impaired Presentation: Cup;Spoon;Straw Pharyngeal  Phase Impairments: Wet Vocal Quality;Cough - Immediate    Nectar Thick Nectar  Thick Liquid: Not tested  Honey Thick Honey Thick Liquid: Not tested   Puree Puree: Within functional limits Presentation: Spoon   Solid     Solid: Not tested      Gwynneth Aliment, M.A., CF-SLP Speech Language Pathology, Acute Rehabilitation Services  Secure Chat preferred [phone]  10/28/2023,12:26 PM

## 2023-10-28 NOTE — TOC Progression Note (Addendum)
Transition of Care [facility]) - Progression Note    Patient Details  Name: [name] MRN: [id number] Date of Birth: February 22, 1956  [facility] CM/SW Contact  Elliot Cousin, RN Phone Number: [phone] 10/28/2023, 4:26 PM  Clinical Narrative:     CM spoke to pt's ex-wife and offered choice for [facility] (medicare.gov list placed on chart). Wife agreeable to [facility]. Contacted [facility] rep, Eber Jones with new referral. Will need HH RN and [facility] PT orders with [facility]. Will call for 3n1 bedside commode from [employer] closer to dc.   States she wants pt to go to her PCP, Thana Ates, MD . Will arrange follow up with this office.    Expected Discharge Plan: Home w Home Health Services Barriers to Discharge: Continued Medical Work up  Expected Discharge Plan and Services   Discharge Planning Services: CM Consult Post Acute Care Choice: Home Health Living arrangements for the past 2 months: Single Family Home                 DME Arranged: 3-N-1 DME Agency: [employer]       HH Arranged: RN, PT HH Agency:  ([facility]) Date HH Agency Contacted: 10/28/23 Time HH Agency Contacted: 1625 Representative spoke with at [facility] Agency: Rhunette Croft   Social Determinants of Health (SDOH) Interventions SDOH Screenings   Food Insecurity: Patient Unable To Answer (10/26/2023)  Housing: Patient Unable To Answer (10/26/2023)  Transportation Needs: Patient Unable To Answer (10/26/2023)  Utilities: Patient Unable To Answer (10/26/2023)  Financial Resource Strain: High Risk (08/30/2022)   Received from [facility]  Tobacco Use: High Risk (10/25/2023)    Readmission Risk Interventions     No data to display

## 2023-10-28 NOTE — Evaluation (Signed)
Physical Therapy Evaluation Patient Details Name: [name] MRN: [id number] DOB: [birth year] Today's Date: 10/28/2023  History of Present Illness  66 yo male admitted 12/14 with AMS and hyperglycemia with CBG >600. 12/15 pt with respiratory failure and thick secretions requiring intubation and bronch. 12/17 extubated. PMHx: IDDM, HTN, HLD, COPD  Clinical Impression  PT tolerating mobility post extubation on RA. Pt with continued confusion and impaired recall of PLOF and home setup with ex-wife [name] able to clarify via phone. Pt with decreased strength, balance and cognition who will benefit from acute therapy to maximize mobility, safety and independence.   HR 81-86 SPO2 92-95% on RA 109/60 before gait, 116/59 after gait      If plan is discharge home, recommend the following: A little help with bathing/dressing/bathroom;A little help with walking and/or transfers;Assistance with cooking/housework;Direct supervision/assist for financial management;Supervision due to cognitive status;Assist for transportation   Can travel by private vehicle        Equipment Recommendations BSC/3in1  Recommendations for Other Services  OT consult    Functional Status Assessment Patient has had a recent decline in their functional status and demonstrates the ability to make significant improvements in function in a reasonable and predictable amount of time.     Precautions / Restrictions Precautions Precautions: Fall      Mobility  Bed Mobility Overal bed mobility: Needs Assistance Bed Mobility: Supine to Sit     Supine to sit: HOB elevated, Used rails, Min assist     General bed mobility comments: min assist to progress legs off EOB, HOB 40 degrees, use of rail with max cues for seqeunce, increased time    Transfers Overall transfer level: Needs assistance   Transfers: Sit to/from Stand Sit to Stand: Min assist           General transfer comment: min assist to rise and  control descent as pt startled by noise returning to bed and nearly lost his balance    Ambulation/Gait Ambulation/Gait assistance: Min assist Gait Distance (Feet): 100 Feet Assistive device: Rolling walker (2 wheels) Gait Pattern/deviations: Step-through pattern, Decreased stride length, Trunk flexed   Gait velocity interpretation: <1.8 ft/sec, indicate of risk for recurrent falls   General Gait Details: cues for direction, posture and proximity to RW. Assist for balance and change of direction  Stairs            Wheelchair Mobility     Tilt Bed    Modified Rankin (Stroke Patients Only)       Balance Overall balance assessment: Needs assistance Sitting-balance support: No upper extremity supported, Feet supported Sitting balance-Leahy Scale: Fair     Standing balance support: Bilateral upper extremity supported Standing balance-Leahy Scale: Poor Standing balance comment: RW in standing                             Pertinent Vitals/Pain Pain Assessment Pain Assessment: No/denies pain    Home Living Family/patient expects to be discharged to:: Private residence Living Arrangements: Non-relatives/Friends Available Help at Discharge: Available 24 hours/day;Family Type of Home: House Home Access: Stairs to enter   [employer] of Steps: 2   Home Layout: One level Home Equipment: Cane - single Librarian, academic (2 wheels);Wheelchair - manual Additional Comments: 1x fall    Prior Function Prior Level of Function : Needs assist;History of Falls (last six months)               ADLs Comments:  ex wife does the IADL     Extremity/Trunk Assessment   Upper Extremity Assessment Upper Extremity Assessment: Generalized weakness    Lower Extremity Assessment Lower Extremity Assessment: Generalized weakness    Cervical / Trunk Assessment Cervical / Trunk Assessment: Kyphotic  Communication   Communication Communication: No  apparent difficulties Cueing Techniques: Verbal cues;Tactile cues;Gestural cues  Cognition Arousal: Alert Behavior During Therapy: WFL for tasks assessed/performed Overall Cognitive Status: Impaired/Different from baseline Area of Impairment: Memory, Following commands, Safety/judgement, Problem solving                     Memory: Decreased short-term memory Following Commands: Follows one step commands inconsistently, Follows one step commands with increased time Safety/Judgement: Decreased awareness of safety, Decreased awareness of deficits   Problem Solving: Slow processing, Requires verbal cues, Requires tactile cues General Comments: pt initially stating he lives alone in mobile home. Called ex-wife [name] to confirm he lives with them and walks with a cane. Slow processing with pt perseverating on wanting water. Cues for direction and safety with increased time to process and perform all mobility        General Comments      Exercises     Assessment/Plan    PT Assessment Patient needs continued PT services  PT Problem List Decreased strength;Decreased activity tolerance;Decreased balance;Decreased mobility;Decreased safety awareness;Decreased knowledge of use of DME;Decreased cognition       PT Treatment Interventions DME instruction;Gait training;Functional mobility training;Therapeutic activities;Therapeutic exercise;Patient/family education;Cognitive remediation;Neuromuscular re-education;Balance training    PT Goals (Current goals can be found in the Care Plan section)  Acute Rehab PT Goals Patient Stated Goal: return home, read mystery PT Goal Formulation: With patient Time For Goal Achievement: 11/11/23 Potential to Achieve Goals: Good    Frequency Min 1X/week     Co-evaluation               AM-PAC PT "6 Clicks" Mobility  Outcome Measure Help needed turning from your back to your side while in a flat bed without using bedrails?: A  Little Help needed moving from lying on your back to sitting on the side of a flat bed without using bedrails?: A Little Help needed moving to and from a bed to a chair (including a wheelchair)?: A Little Help needed standing up from a chair using your arms (e.g., wheelchair or bedside chair)?: A Little Help needed to walk in hospital room?: A Little Help needed climbing 3-5 steps with a railing? : A Lot 6 Click Score: 17    End of Session   Activity Tolerance: Patient tolerated treatment well Patient left: in bed;with call bell/phone within reach;with bed alarm set (per RN request to return to bed) Nurse Communication: Mobility status PT Visit Diagnosis: Other abnormalities of gait and mobility (R26.89);Difficulty in walking, not elsewhere classified (R26.2);History of falling (Z91.81)    Time: 1201-1229 PT Time Calculation (min) (ACUTE ONLY): 28 min   Charges:   PT Evaluation $PT Eval Moderate Complexity: 1 Mod PT Treatments $Gait Training: 8-22 mins PT General Charges $$ ACUTE PT VISIT: 1 Visit         Merryl Hacker, PT Acute Rehabilitation Services Office: [phone]   Enedina Finner Jhanae Jaskowiak 10/28/2023, 12:42 PM

## 2023-10-28 NOTE — Plan of Care (Signed)
  Problem: Education: Goal: Knowledge of General Education information will improve Description: Including pain rating scale, medication(s)/side effects and non-pharmacologic comfort measures Outcome: Progressing   Problem: Health Behavior/Discharge Planning: Goal: Ability to manage health-related needs will improve Outcome: Progressing   Problem: Clinical Measurements: Goal: Ability to maintain clinical measurements within normal limits will improve Outcome: Progressing Goal: Will remain free from infection Outcome: Progressing Goal: Diagnostic test results will improve Outcome: Progressing Goal: Respiratory complications will improve Outcome: Progressing Goal: Cardiovascular complication will be avoided Outcome: Progressing   Problem: Activity: Goal: Risk for activity intolerance will decrease Outcome: Progressing   Problem: Nutrition: Goal: Adequate nutrition will be maintained Outcome: Progressing   Problem: Coping: Goal: Level of anxiety will decrease Outcome: Progressing   Problem: Elimination: Goal: Will not experience complications related to bowel motility Outcome: Progressing Goal: Will not experience complications related to urinary retention Outcome: Progressing   Problem: Pain Management: Goal: General experience of comfort will improve Outcome: Progressing   Problem: Safety: Goal: Ability to remain free from injury will improve Outcome: Progressing   Problem: Skin Integrity: Goal: Risk for impaired skin integrity will decrease Outcome: Progressing   Problem: Education: Goal: Ability to describe self-care measures that may prevent or decrease complications (Diabetes Survival Skills Education) will improve Outcome: Progressing Goal: Individualized Educational Video(s) Outcome: Progressing   Problem: Coping: Goal: Ability to adjust to condition or change in health will improve Outcome: Progressing   Problem: Fluid Volume: Goal: Ability to  maintain a balanced intake and output will improve Outcome: Progressing   Problem: Health Behavior/Discharge Planning: Goal: Ability to identify and utilize available resources and services will improve Outcome: Progressing Goal: Ability to manage health-related needs will improve Outcome: Progressing   Problem: Metabolic: Goal: Ability to maintain appropriate glucose levels will improve Outcome: Progressing   Problem: Nutritional: Goal: Maintenance of adequate nutrition will improve Outcome: Progressing Goal: Progress toward achieving an optimal weight will improve Outcome: Progressing   Problem: Skin Integrity: Goal: Risk for impaired skin integrity will decrease Outcome: Progressing   Problem: Tissue Perfusion: Goal: Adequacy of tissue perfusion will improve Outcome: Progressing   Problem: Activity: Goal: Ability to tolerate increased activity will improve Outcome: Progressing   Problem: Respiratory: Goal: Ability to maintain a clear airway and adequate ventilation will improve Outcome: Progressing   Problem: Role Relationship: Goal: Method of communication will improve Outcome: Progressing

## 2023-10-28 NOTE — Evaluation (Signed)
Occupational Therapy Evaluation Patient Details Name: [name] MRN: [id number] DOB: 05-09-1956 Today's Date: 10/28/2023   History of Present Illness 67 yo male admitted 12/14 with AMS and hyperglycemia with CBG >600. 12/15 pt with respiratory failure and thick secretions requiring intubation and bronch. 12/17 extubated. PMHx: IDDM, HTN, HLD, COPD   Clinical Impression   PTA, per chart, pt lived with ex wife who assisted with IADL; pt reported he lived aline independently and did not drive. Upon eval, pt with decreased memory, problem solving, awareness, balance, strength. Needing grossly min A to CGA for mobility. Pending family ability to provide 24/7 supervision and assist as needed, recommending discharge home with HHOT.        If plan is discharge home, recommend the following: A little help with walking and/or transfers;A little help with bathing/dressing/bathroom;A lot of help with bathing/dressing/bathroom;Assistance with cooking/housework;Direct supervision/assist for medications management;Direct supervision/assist for financial management;Assist for transportation;Help with stairs or ramp for entrance    Functional Status Assessment  Patient has had a recent decline in their functional status and demonstrates the ability to make significant improvements in function in a reasonable and predictable amount of time.  Equipment Recommendations  BSC/3in1    Recommendations for Other Services       Precautions / Restrictions Precautions Precautions: Fall Restrictions Weight Bearing Restrictions Per Provider Order: No      Mobility Bed Mobility Overal bed mobility: Needs Assistance Bed Mobility: Supine to Sit, Sit to Supine     Supine to sit: HOB elevated, Used rails, Min assist Sit to supine: Min assist   General bed mobility comments: min assist to progress legs off EOB, HOB 40 degrees, use of rail with max cues for seqeunce, increased time    Transfers Overall  transfer level: Needs assistance Equipment used: Rolling walker (2 wheels) Transfers: Sit to/from Stand Sit to Stand: Min assist           General transfer comment: Min A for rise and lower      Balance Overall balance assessment: Needs assistance Sitting-balance support: No upper extremity supported, Feet supported Sitting balance-Leahy Scale: Fair     Standing balance support: Bilateral upper extremity supported Standing balance-Leahy Scale: Poor Standing balance comment: RW in standing                           ADL either performed or assessed with clinical judgement   ADL Overall ADL's : Needs assistance/impaired Eating/Feeding: NPO   Grooming: Set up;Oral care;Sitting   Upper Body Bathing: Contact guard assist;Minimal assistance;Sitting   Lower Body Bathing: Sit to/from stand;Minimal assistance   Upper Body Dressing : Minimal assistance;Sitting   Lower Body Dressing: Minimal assistance;Bed level Lower Body Dressing Details (indicate cue type and reason): doffing socks. Min A for cues for problem solving Toilet Transfer: Minimal assistance;Rolling walker (2 wheels)           Functional mobility during ADLs: Minimal assistance;Rolling walker (2 wheels)       Vision   Additional Comments: able to read signage on wall with incresaed time     Perception Perception: Not tested       Praxis Praxis: Not tested       Pertinent Vitals/Pain Pain Assessment Pain Assessment: Faces Faces Pain Scale: Hurts little more Pain Location: HA Pain Descriptors / Indicators: Aching, Discomfort, Grimacing Pain Intervention(s): Limited activity within patient's tolerance, Monitored during session     Extremity/Trunk Assessment Upper Extremity Assessment Upper  Extremity Assessment: Generalized weakness   Lower Extremity Assessment Lower Extremity Assessment: Defer to PT evaluation   Cervical / Trunk Assessment Cervical / Trunk Assessment: Kyphotic    Communication Communication Communication: No apparent difficulties Cueing Techniques: Verbal cues;Tactile cues   Cognition Arousal: Alert Behavior During Therapy: WFL for tasks assessed/performed Overall Cognitive Status: Impaired/Different from baseline Area of Impairment: Memory, Following commands, Safety/judgement, Problem solving, Attention, Awareness                   Current Attention Level: Focused, Sustained (sustained for short periods) Memory: Decreased short-term memory Following Commands: Follows one step commands inconsistently, Follows one step commands with increased time Safety/Judgement: Decreased awareness of safety, Decreased awareness of deficits Awareness: Intellectual, Emergent (intermittent moments of emergent awareness "i can't move") Problem Solving: Slow processing, Requires verbal cues, Requires tactile cues General Comments: pt initially stating he lives alone in mobile home. Called ex-wife [name] to confirm he lives with them and walks with a cane. Slow processing with pt perseverating on wanting water. Cues for direction and safety with increased time to process and perform all mobility. inconsistently oriented to situation. Needing frequent reminders of previously provided information     General Comments  BPs in flow sheet 129/70 at end of session.    Exercises     Shoulder Instructions      Home Living Family/patient expects to be discharged to:: Private residence Living Arrangements: Non-relatives/Friends Available Help at Discharge: Available 24 hours/day;Family Type of Home: House Home Access: Stairs to enter [employer] of Steps: 2   Home Layout: One level     Bathroom Shower/Tub: Producer, television/film/video: Handicapped height     Home Equipment: Cane - single Librarian, academic (2 wheels);Wheelchair - manual   Additional Comments: 1x fall      Prior Functioning/Environment Prior Level of Function :  Needs assist;History of Falls (last six months)               ADLs Comments: ex wife does the IADL per chart; pt reported he lives alone but confirmed with ex wife on phone by PT this morning        OT Problem List: Decreased strength;Impaired balance (sitting and/or standing);Decreased activity tolerance;Impaired vision/perception;Decreased cognition;Decreased safety awareness;Decreased knowledge of use of DME or AE      OT Treatment/Interventions: Self-care/ADL training;Therapeutic exercise;DME and/or AE instruction;Balance training;Patient/family education;Therapeutic activities;Cognitive remediation/compensation    OT Goals(Current goals can be found in the care plan section) Acute Rehab OT Goals Patient Stated Goal: none stated OT Goal Formulation: With patient Time For Goal Achievement: 11/11/23 Potential to Achieve Goals: Good  OT Frequency: Min 1X/week    Co-evaluation              AM-PAC OT "6 Clicks" Daily Activity     Outcome Measure Help from another person eating meals?: Total (NPO) Help from another person taking care of personal grooming?: A Little Help from another person toileting, which includes using toliet, bedpan, or urinal?: Total (fecal and urinary management systems) Help from another person bathing (including washing, rinsing, drying)?: A Lot Help from another person to put on and taking off regular upper body clothing?: A Little Help from another person to put on and taking off regular lower body clothing?: A Little 6 Click Score: 13   End of Session Equipment Utilized During Treatment: Gait belt;Rolling walker (2 wheels) Nurse Communication: Mobility status (HA, continues to ask for water)  Activity Tolerance: Patient tolerated treatment  well Patient left: in bed;with call bell/phone within reach;with bed alarm set  OT Visit Diagnosis: Unsteadiness on feet (R26.81);Muscle weakness (generalized) (M62.81);Other symptoms and signs involving  cognitive function;History of falling (Z91.81)                Time: 4098-1191 OT Time Calculation (min): 33 min Charges:  OT General Charges $OT Visit: 1 Visit OT Evaluation $OT Eval Moderate Complexity: 1 Mod OT Treatments $Self Care/Home Management : 8-22 mins  Tyler Deis, OTR/L [facility] Acute Rehabilitation Office: [phone]   Myrla Halsted 10/28/2023, 2:15 PM

## 2023-10-28 NOTE — Inpatient Diabetes Management (Signed)
Inpatient Diabetes Program Recommendations  AACE/ADA: New Consensus Statement on Inpatient Glycemic Control (2015)  Target Ranges:  Prepandial:   less than 140 mg/dL      Peak postprandial:   less than 180 mg/dL (1-2 hours)      Critically ill patients:  140 - 180 mg/dL   Lab Results  Component Value Date   GLUCAP 201 (H) 10/28/2023   HGBA1C 14.2 (H) 10/25/2023    Review of Glycemic Control  Latest Reference Range & Units 10/27/23 15:39 10/27/23 19:59 10/27/23 23:10 10/28/23 04:38 10/28/23 07:38  Glucose-Capillary 70 - 99 mg/dL 161 (H) 096 (H) 045 (H) 205 (H) 201 (H)  (H): Data is abnormally high Diabetes history: Type 2 DM Outpatient Diabetes medications: Basaglar 16 units QHS Current orders for Inpatient glycemic control: Semglee 5 units at bedtime, Novolog 0-9 units Q4H  Inpatient Diabetes Program Recommendations:    Consider increasing Semglee to 8 units every day  Thanks, Lujean Rave, MSN, RNC-OB Diabetes Coordinator [phone] (8a-5p)

## 2023-10-29 ENCOUNTER — Inpatient Hospital Stay (HOSPITAL_COMMUNITY): Payer: Medicare Other

## 2023-10-29 DIAGNOSIS — Z66 Do not resuscitate: Secondary | ICD-10-CM

## 2023-10-29 DIAGNOSIS — E43 Unspecified severe protein-calorie malnutrition: Secondary | ICD-10-CM

## 2023-10-29 DIAGNOSIS — R1312 Dysphagia, oropharyngeal phase: Secondary | ICD-10-CM | POA: Diagnosis not present

## 2023-10-29 DIAGNOSIS — N19 Unspecified kidney failure: Secondary | ICD-10-CM

## 2023-10-29 DIAGNOSIS — G9341 Metabolic encephalopathy: Secondary | ICD-10-CM | POA: Diagnosis not present

## 2023-10-29 DIAGNOSIS — Z515 Encounter for palliative care: Secondary | ICD-10-CM | POA: Diagnosis not present

## 2023-10-29 DIAGNOSIS — R739 Hyperglycemia, unspecified: Secondary | ICD-10-CM | POA: Diagnosis not present

## 2023-10-29 DIAGNOSIS — N3000 Acute cystitis without hematuria: Secondary | ICD-10-CM | POA: Diagnosis not present

## 2023-10-29 LAB — CBC
HCT: 35.7 % — ABNORMAL LOW (ref 39.0–52.0)
Hemoglobin: 11.9 g/dL — ABNORMAL LOW (ref 13.0–17.0)
MCH: 30.8 pg (ref 26.0–34.0)
MCHC: 33.3 g/dL (ref 30.0–36.0)
MCV: 92.5 fL (ref 80.0–100.0)
Platelets: 111 10*3/uL — ABNORMAL LOW (ref 150–400)
RBC: 3.86 MIL/uL — ABNORMAL LOW (ref 4.22–5.81)
RDW: 14.3 % (ref 11.5–15.5)
WBC: 19 10*3/uL — ABNORMAL HIGH (ref 4.0–10.5)
nRBC: 0 % (ref 0.0–0.2)

## 2023-10-29 LAB — GLUCOSE, CAPILLARY
Glucose-Capillary: 218 mg/dL — ABNORMAL HIGH (ref 70–99)
Glucose-Capillary: 219 mg/dL — ABNORMAL HIGH (ref 70–99)
Glucose-Capillary: 248 mg/dL — ABNORMAL HIGH (ref 70–99)
Glucose-Capillary: 336 mg/dL — ABNORMAL HIGH (ref 70–99)
Glucose-Capillary: 339 mg/dL — ABNORMAL HIGH (ref 70–99)
Glucose-Capillary: 373 mg/dL — ABNORMAL HIGH (ref 70–99)
Glucose-Capillary: 374 mg/dL — ABNORMAL HIGH (ref 70–99)

## 2023-10-29 LAB — BASIC METABOLIC PANEL
Anion gap: 18 — ABNORMAL HIGH (ref 5–15)
BUN: 30 mg/dL — ABNORMAL HIGH (ref 8–23)
CO2: 17 mmol/L — ABNORMAL LOW (ref 22–32)
Calcium: 8.7 mg/dL — ABNORMAL LOW (ref 8.9–10.3)
Chloride: 115 mmol/L — ABNORMAL HIGH (ref 98–111)
Creatinine, Ser: 1.83 mg/dL — ABNORMAL HIGH (ref 0.61–1.24)
GFR, Estimated: 40 mL/min — ABNORMAL LOW (ref >60)
Glucose, Bld: 237 mg/dL — ABNORMAL HIGH (ref 70–99)
Potassium: 3.3 mmol/L — ABNORMAL LOW (ref 3.5–5.1)
Sodium: 150 mmol/L — ABNORMAL HIGH (ref 135–145)

## 2023-10-29 LAB — MAGNESIUM
Magnesium: 2.3 mg/dL (ref 1.7–2.4)
Magnesium: 2.2 mg/dL (ref 1.7–2.4)

## 2023-10-29 LAB — PHOSPHORUS
Phosphorus: 3.5 mg/dL (ref 2.5–4.6)
Phosphorus: 2.3 mg/dL — ABNORMAL LOW (ref 2.5–4.6)
Phosphorus: 3.5 mg/dL (ref 2.5–4.6)

## 2023-10-29 MED ORDER — OSMOLITE 1.5 CAL PO LIQD
1000.0000 mL | ORAL | Status: DC
  Administered 2023-10-29: 1000 mL
  Filled 2023-10-29 (×5): qty 1000

## 2023-10-29 MED ORDER — PROSOURCE TF20 ENFIT COMPATIBL EN LIQD
60.0000 mL | Freq: Every day | ENTERAL | Status: DC
  Administered 2023-10-29 – 2023-11-02 (×5): 60 mL
  Filled 2023-10-29 (×5): qty 60

## 2023-10-29 MED ORDER — FREE WATER
300.0000 mL | Freq: Four times a day (QID) | Status: DC
Start: 2023-10-29 — End: 2023-10-30
  Administered 2023-10-29 – 2023-10-30 (×3): 300 mL

## 2023-10-29 MED ORDER — SODIUM CHLORIDE 0.9 % IV SOLN
3.0000 g | Freq: Four times a day (QID) | INTRAVENOUS | Status: AC
  Administered 2023-10-29 – 2023-11-02 (×14): 3 g via INTRAVENOUS
  Filled 2023-10-29 (×14): qty 8

## 2023-10-29 MED ORDER — POTASSIUM CHLORIDE 20 MEQ PO PACK
40.0000 meq | PACK | Freq: Once | ORAL | Status: AC
Start: 2023-10-29 — End: 2023-10-29
  Administered 2023-10-29: 40 meq
  Filled 2023-10-29: qty 2

## 2023-10-29 MED ORDER — SODIUM CHLORIDE 0.45 % IV SOLN: INTRAVENOUS | Status: AC

## 2023-10-29 MED ORDER — CARVEDILOL 6.25 MG PO TABS
6.2500 mg | ORAL_TABLET | Freq: Every day | ORAL | Status: DC
  Administered 2023-10-29 – 2023-10-30 (×2): 6.25 mg
  Filled 2023-10-29 (×2): qty 1

## 2023-10-29 NOTE — Plan of Care (Signed)
  Problem: Clinical Measurements: Goal: Ability to maintain clinical measurements within normal limits will improve Outcome: Progressing   Problem: Nutrition: Goal: Adequate nutrition will be maintained Outcome: Progressing   Problem: Skin Integrity: Goal: Risk for impaired skin integrity will decrease Outcome: Progressing   Problem: Coping: Goal: Ability to adjust to condition or change in health will improve Outcome: Progressing

## 2023-10-29 NOTE — Procedures (Signed)
Cortrak  Person Inserting Tube:  Korby Ratay T, RD Tube Type:  Cortrak - 43 inches Tube Size:  10 Tube Location:  Right nare Secured by: Bridle Technique Used to Measure Tube Placement:  Marking at nare/corner of mouth Cortrak Secured At:  67 cm   Cortrak Tube Team Note:  Consult received to place a Cortrak feeding tube.   X-ray is required, abdominal x-ray has been ordered by the Cortrak team. Please confirm tube placement before using the Cortrak tube.   If the tube becomes dislodged please keep the tube and contact the Cortrak team at [email or web address] for replacement.  If after hours and replacement cannot be delayed, place a NG tube and confirm placement with an abdominal x-ray.    Shelle Iron RD, LDN Contact via [employer].

## 2023-10-29 NOTE — Care Management Important Message (Signed)
Important Message  Patient Details  Name: [name] MRN: [id number] Date of Birth: 09-15-1956   Important Message Given:  Yes - Medicare IM     Dorena Bodo 10/29/2023, 3:21 PM

## 2023-10-29 NOTE — Progress Notes (Signed)
Speech Language Pathology Treatment: Dysphagia  Patient Details Name: [name] MRN: [id number] DOB: 1956-06-01 Today's Date: 10/29/2023 Time: 4098-1191 SLP Time Calculation (min) (ACUTE ONLY): 21 min  Assessment / Plan / Recommendation Clinical Impression  Pt awake and alert, stating he has no recollection of visit yesterday or MBS. Provided education regarding POC and MBS results to pt, his ex-wife, and their sons. Subjectively, his quality of voice appears improved. Trialed ice chips x5 with immediate wet coughing following all trials. Pt completed 2 sets of 5 reps EMST set to [facility] with great effort. Posted sign at [facility] reinforcing need for nursing staff to provide thorough oral care QID prior to providing 2-3 ice chips. SLP will continue following to assess readiness to participate in repeat MBS prior to initiating an oral diet.    HPI HPI: [name] is a 67 yo male presenting to ED 12/14 with AMS. Admitted with septic shock due to acute UTI and acute respiratory failure in the setting of PNA (aspiration v community acquired). ETT 12/15-12/17. PMH includes polysubstance abuse, T2DM, HTN, HLD      SLP Plan  Continue with current plan of care      Recommendations for follow up therapy are one component of a multi-disciplinary discharge planning process, led by the attending physician.  Recommendations may be updated based on patient status, additional functional criteria and insurance authorization.    Recommendations  Diet recommendations: NPO Medication Administration: Via alternative means                  Oral care QID;Staff/trained caregiver to provide oral care   Frequent or constant Supervision/Assistance Dysphagia, oropharyngeal phase (R13.12)     Continue with current plan of care     Gwynneth Aliment, M.A., CF-SLP Speech Language Pathology, Acute Rehabilitation Services  Secure Chat preferred [phone]   10/29/2023, 4:13 PM

## 2023-10-29 NOTE — Progress Notes (Addendum)
Mobility Specialist Progress Note;   10/29/23 1115  Mobility  Activity Stood at bedside  Level of Assistance Minimal assist, patient does 75% or more  Assistive Device Front wheel walker  Activity Response Tolerated fair  Mobility Referral Yes  Mobility visit 1 Mobility  Mobility Specialist Start Time (ACUTE ONLY) 1115  Mobility Specialist Stop Time (ACUTE ONLY) 1130  Mobility Specialist Time Calculation (min) (ACUTE ONLY) 15 min   Pt agreeable to mobility. Required MinA for STS. Upon standing, HR increased to 140s, deferring further mobility at this time. Pt able to perform 3 STS and then returned back to bed. Pt left in bed with all needs met, alarm on.  Caesar Bookman Mobility Specialist Please contact via SecureChat or [employer] [phone]

## 2023-10-29 NOTE — Plan of Care (Signed)
  Problem: Education: Goal: Knowledge of General Education information will improve Description: Including pain rating scale, medication(s)/side effects and non-pharmacologic comfort measures Outcome: Progressing   Problem: Clinical Measurements: Goal: Cardiovascular complication will be avoided Outcome: Progressing   Problem: Elimination: Goal: Will not experience complications related to bowel motility Outcome: Progressing   Problem: Safety: Goal: Ability to remain free from injury will improve Outcome: Progressing   Problem: Education: Goal: Ability to describe self-care measures that may prevent or decrease complications (Diabetes Survival Skills Education) will improve Outcome: Progressing   Problem: Respiratory: Goal: Ability to maintain a clear airway and adequate ventilation will improve Outcome: Progressing   Problem: Fluid Volume: Goal: Ability to maintain a balanced intake and output will improve Outcome: Progressing

## 2023-10-29 NOTE — Progress Notes (Signed)
Nutrition Follow-up  DOCUMENTATION CODES:   Severe malnutrition in context of chronic illness, Underweight  INTERVENTION:  Initiate tube feeding via Cortrak: Osmolite 1.5 at 45 ml/h (1080 ml per day) Start continuous feeding at 36ml/hr, advance rate by 10 ml every 12 hours till goal rate of 45 ml/hr is met.  Prosource TF20 60 ml daily Continue with FWF every 6 hours Provides 1700 kcal, 86 gm protein, 1620 ml free water daily, including FWF Continue with Thiamine till discontinue date. Monitor magnesium, potassium, and phosphorus BID for at least 3 days, MD to replete as needed, as pt is at risk for refeeding syndrome given severe malnutrition.    NUTRITION DIAGNOSIS:   Severe Malnutrition related to chronic illness as evidenced by severe muscle depletion, severe fat depletion.  Ongoing with intervention in place  GOAL:   Patient will meet greater than or equal to 90% of their needs    MONITOR:   Vent status, Labs, Weight trends, TF tolerance, Skin  REASON FOR ASSESSMENT:   Consult Enteral/tube feeding initiation and management  ASSESSMENT:   67 yo male admitted with severe hyperglycemia without DKA, CBG 800, septic shock due to acute UTI and acute respiratory failure secondary to aspiration pneumonia. PMH includes DM, HTN. RD met with pt he consistently kept asking for ice chips and food.  He asked if I could give him something to stop him from choking on his ice chips. Cortrak RD in room with RD at this time. Both explained cortrak and the need for it at this time. He acknowledged understanding but then would ask for food or ice chips. Cortrak placed pending x-ray verification.  Very limited information on nutrition history.  After Cortrak placed he started talking about fishing.   12/15: admitted to the ICU with acute hypoxic resp failure. Intubated.  12/16: no issues overnight, remains intubated and sedated.  Off pressors 12/17: extubated to nasal cannula. Urine  and resp culture both with polymicrobial growth  12/17: MBSS-  Noted significant pharyngeal residue and incomplete laryngeal elevation, resulting in repeat aspiration with weak coughing and subsequent aspirated materials mixed with secretions. Aspirating thin amounts of thin liquid, nectar thick liquids, and honey thick liquids before initiating an ineffective cough. 12/18- Cortrak ordered    Admit weight: 56.7 kg  Current weight: 62.8 kg  Weight history; 10/29/23 62.8 kg  10/01/23 59 kg  09/29/23 59 kg     Average Meal Intake: NPO  Nutritionally Relevant Medications: Scheduled Meds:  atorvastatin  20 mg Per Tube Daily   docusate  100 mg Per Tube BID   famotidine  20 mg Per Tube QHS   folic acid  1 mg Per Tube Daily   free water  200 mL Per Tube Q6H   guaiFENesin  10 mL Per Tube BID   multivitamin with minerals  1 tablet Per Tube Daily   thiamine  100 mg Per Tube Daily    Labs Reviewed: Component Ref Range & Units (hover) 02:25 (10/29/23) 1 d ago (10/28/23) 1 d ago (10/28/23) 2 d ago (10/27/23) 3 d ago (10/26/23) 3 d ago (10/26/23) 3 d ago (10/26/23)  Sodium 150 High  146 High  147 High  149 High  151 High  150 High  154 High   Potassium 3.3 Low  4.1 3.2 Low  3.4 Low  3.1 Low  3.6 4.1  Chloride 115 High  115 High  117 High  116 High  119 High  112 High    CO2  17 Low  24 20 Low  21 Low  18 Low  24   Glucose, Bld 237 High  175 High  CM 187 High  CM 177 High  CM 76 CM 93 C    HgbA1C: 14.2 H    NUTRITION - FOCUSED PHYSICAL EXAM:  Flowsheet Row Most Recent Value  Orbital Region Severe depletion  Upper Arm Region Severe depletion  Thoracic and Lumbar Region Severe depletion  Buccal Region Severe depletion  Temple Region Severe depletion  Clavicle Bone Region Severe depletion  Clavicle and Acromion Bone Region Severe depletion  Scapular Bone Region Severe depletion  Dorsal Hand Severe depletion  Patellar Region Severe depletion  Anterior Thigh Region Severe  depletion  Posterior Calf Region Severe depletion  Edema (RD Assessment) None  Hair Reviewed  Eyes Reviewed  Mouth Reviewed  Skin Reviewed  Nails Reviewed       Diet Order:   Diet Order             Diet NPO time specified  Diet effective now                   EDUCATION NEEDS:   Not appropriate for education at this time  Skin:  Skin Assessment: Reviewed RN Assessment  Last BM:  12/16 via FMS  Height:   Ht Readings from Last 1 Encounters:  10/25/23 5\' 11"  (1.803 m)    Weight:   Wt Readings from Last 1 Encounters:  10/29/23 62.8 kg    Ideal Body Weight:     BMI:  Body mass index is 19.31 kg/m.  Estimated Nutritional Needs:   Kcal:  1700-1900 kcals  Protein:  100-115 g  Fluid:  >/= 2L    Jamelle Haring RDN, LDN Clinical Dietitian  Pleas see Amion for contact information

## 2023-10-29 NOTE — Progress Notes (Addendum)
PROGRESS NOTE        PATIENT DETAILS Name: [name] Age: 67 y.o. Sex: male Date of Birth: April 19, 1956 Admit Date: 10/25/2023 Admitting Physician Angie Fava, DO ZOX:WRUEAV, [facility]  Brief Summary: Patient is a 67 y.o.  male with history of DM-2, HTN, HLD, frail/cachectic at baseline-presented with altered mental status-found to have HHS-septic shock secondary to PNA/UTI-intubated-and admitted to the ICU.  Briefly required vasopressors in the ICU.  Extubated on 12/17-and transferred to [facility].    Significant events: 12/14>> to ED-altered mental status-admit to [facility]-HHS-started on insulin drip-severe sepsis due to UTI. 12/15>> worsening shortness of breath- intubated-admit to ICU-bronchoscopy-extensive white thick secretions. 12/16>> off pressors 12/17>> extubated 12/18>> transferred to [facility]  Significant studies: 11/19>> CTA chest: Stable aneurysmal disease of distal aortic arch and proximal descending thoracic aorta-5 cm in maximal diameter. 12/14>> CXR: No acute changes.-Massively dilated aortic arch. 12/16>> echo: EF 55-60%.  Significant microbiology data: 12/14>> blood culture: No growth 12/14>> urine culture: Multiple species 12/15>> tracheal aspirate: Normal respiratory flora   Procedures: 12/15-12/17>> ETT 12/15>> bronchoscopy: Extensive white secretions 12/18>>Cortrak tube  Consults: PCCM  Subjective: Frail elderly patient-chronically sick appearing-asking for water but is NPO.  Objective: Vitals: Blood pressure 133/89, pulse (!) 120, temperature 98.6 F (37 C), temperature source Oral, resp. rate (!) 30, height 5\' 11"  (1.803 m), weight 62.8 kg, SpO2 93%.   Exam: Gen Exam:Alert awake-not in any distress HEENT:atraumatic, normocephalic Chest: Bibasilar rales CVS:S1S2 regular Abdomen:soft non tender, non distended Extremities:no edema Neurology: Non focal-but with generalized weakness. Skin: no rash  Pertinent  Labs/Radiology:    Latest Ref Rng & Units 10/29/2023    2:25 AM 10/28/2023    7:23 AM 10/26/2023    7:19 AM  CBC  WBC 4.0 - 10.5 K/uL 19.0  16.3    Hemoglobin 13.0 - 17.0 g/dL 40.9  81.1  91.4   Hematocrit 39.0 - 52.0 % 35.7  32.0  46.0   Platelets 150 - 400 K/uL 111  87      Lab Results  Component Value Date   NA 150 (H) 10/29/2023   K 3.3 (L) 10/29/2023   CL 115 (H) 10/29/2023   CO2 17 (L) 10/29/2023      Assessment/Plan: Acute hypoxic respiratory failure secondary to PNA Intubated after admission-?  Aspiration in the setting of encephalopathy Overall improved-now on room air Stop Zosyn-switch to Unasyn-x 7 days total antibiotics anticipated. Evaluated by SLP on 12/7-kept n.p.o. due to severe oropharyngeal dysphagia. Repeat CXR tomorrow morning.  Septic shock secondary to PNA/complicated UTI Required pressors in the ICU Sepsis physiology has resolved On empiric antibiotics as above  Acute metabolic encephalopathy Secondary to HHS/septic shock Resolved-completely awake and alert  AKI Suspect hemodynamically mediated Resume  IVF for a few hours today Avoid nephrotoxic agents Recheck electrolytes tomorrow-if no improvement-we can consider further workup.  Hypernatremia Secondary to free water deficit-n.p.o.-secondary to oropharyngeal dysphagia 0.45 NS-once Cortrak to play-will start free water via NG tube.  HTN BP slowly creeping up Coreg to be resumed.  Aortic aneurysm Recent CTA prior to this hospitalization with significant dilatation of the distal aortic arch Resume coreg Currently not in any shape for vascular surgery evaluation-suspect this will need to be pursued in the outpatient setting-as this is a chronic issue  HLD Statin  HHS DM-2 (A1c 14.2 on 12/14) with uncontrolled hyperglycemia CBGs  currently stable Anticipate once tube feeds started-will require basal regimen Cautiously continue with SSI for now  Recent Labs    10/29/23 0451  10/29/23 0829 10/29/23 1206  GLUCAP 248* 219* 218*     COPD Stable Bronchodilators  GERD Pepcid  Oropharyngeal dysphagia Likely due to severe debility/deconditioning Cortrak tube placed 12/18 SLP following-remains n.p.o.  Debility/deconditioning PT/OT eval-suspect will require SNF  Nutrition Status: Nutrition Problem: Severe Malnutrition Etiology: chronic illness Signs/Symptoms: severe muscle depletion, severe fat depletion Interventions: Refer to RD note for recommendations  Code status:   Code Status: Limited: Do not attempt resuscitation (DNR) -DNR-LIMITED -Do Not Intubate/DNI    DVT Prophylaxis: heparin injection 5,000 Units Start: 10/26/23 1400 SCDs Start: 10/25/23 2232   Family Communication: None at bedside   Disposition Plan: Status is: Inpatient Remains inpatient appropriate because: Severe of illness   Planned Discharge Destination:Skilled nursing facility   Diet: Diet Order             Diet NPO time specified  Diet effective now                     Antimicrobial agents: Anti-infectives (From admission, onward)    Start     Dose/Rate Route Frequency Ordered Stop   10/28/23 0500  vancomycin (VANCOREADY) IVPB 750 mg/150 mL  Status:  Discontinued        750 mg 150 mL/hr over 60 Minutes Intravenous Every 48 hours 10/26/23 0406 10/26/23 0810   10/26/23 2200  cefTRIAXone (ROCEPHIN) 2 g in sodium chloride 0.9 % 100 mL IVPB  Status:  Discontinued        2 g 200 mL/hr over 30 Minutes Intravenous Every 24 hours 10/26/23 0010 10/26/23 0334   10/26/23 1200  piperacillin-tazobactam (ZOSYN) IVPB 3.375 g        3.375 g 12.5 mL/hr over 240 Minutes Intravenous Every 8 hours 10/26/23 0810     10/26/23 0900  azithromycin (ZITHROMAX) 500 mg in sodium chloride 0.9 % 250 mL IVPB        500 mg 250 mL/hr over 60 Minutes Intravenous Every 24 hours 10/26/23 0807 10/28/23 0925   10/26/23 0445  piperacillin-tazobactam (ZOSYN) IVPB 2.25 g  Status:  Discontinued         2.25 g 100 mL/hr over 30 Minutes Intravenous Every 8 hours 10/26/23 0351 10/26/23 0810   10/26/23 0445  vancomycin (VANCOREADY) IVPB 1250 mg/250 mL        1,250 mg 166.7 mL/hr over 90 Minutes Intravenous  Once 10/26/23 0351 10/26/23 0607   10/25/23 2200  cefTRIAXone (ROCEPHIN) 2 g in sodium chloride 0.9 % 100 mL IVPB  Status:  Discontinued        2 g 200 mL/hr over 30 Minutes Intravenous Every 24 hours 10/25/23 2154 10/26/23 0009        MEDICATIONS: Scheduled Meds:  arformoterol  15 mcg Nebulization BID   atorvastatin  20 mg Per Tube Daily   Chlorhexidine Gluconate Cloth  6 each Topical Daily   docusate  100 mg Per Tube BID   famotidine  20 mg Per Tube QHS   feeding supplement (PROSource TF20)  60 mL Per Tube Daily   folic acid  1 mg Per Tube Daily   free water  200 mL Per Tube Q6H   guaiFENesin  10 mL Per Tube BID   heparin injection (subcutaneous)  5,000 Units Subcutaneous Q8H   insulin aspart  0-9 Units Subcutaneous Q4H   multivitamin with minerals  1 tablet  Per Tube Daily   nicotine  14 mg Transdermal Daily   mouth rinse  15 mL Mouth Rinse 4 times per day   polyethylene glycol  17 g Per Tube Daily   revefenacin  175 mcg Nebulization Daily   thiamine  100 mg Per Tube Daily   Continuous Infusions:  sodium chloride 75 mL/hr at 10/29/23 1022   feeding supplement (OSMOLITE 1.5 CAL) 1,000 mL (10/29/23 1253)   piperacillin-tazobactam (ZOSYN)  IV 3.375 g (10/29/23 1253)   PRN Meds:.acetaminophen (TYLENOL) oral liquid 160 mg/5 mL **OR** acetaminophen, dextrose, fentaNYL (SUBLIMAZE) injection, melatonin, ondansetron (ZOFRAN) IV, mouth rinse, mouth rinse, phenol   I have personally reviewed following labs and imaging studies  LABORATORY DATA: CBC: Recent Labs  Lab 10/25/23 1707 10/25/23 1720 10/26/23 0444 10/26/23 0602 10/26/23 0719 10/28/23 0723 10/29/23 0225  WBC 20.7*  --  20.2*  --   --  16.3* 19.0*  NEUTROABS 17.3*  --  15.8*  --   --   --   --   HGB 17.1*    < > 17.2* 15.3 15.6 10.6* 11.9*  HCT 52.6*   < > 51.7 45.0 46.0 32.0* 35.7*  MCV 94.1  --  93.0  --   --  93.0 92.5  PLT 235  --  209  --   --  87* 111*   < > = values in this interval not displayed.    Basic Metabolic Panel: Recent Labs  Lab 10/26/23 0444 10/26/23 0602 10/26/23 1252 10/27/23 0310 10/28/23 0242 10/28/23 0723 10/28/23 0729 10/29/23 0225 10/29/23 1139  NA 148*   < > 151* 149* 147* 146*  --  150*  --   K 3.8   < > 3.1* 3.4* 3.2* 4.1  --  3.3*  --   CL 111   < > 119* 116* 117* 115*  --  115*  --   CO2 21*   < > 18* 21* 20* 24  --  17*  --   GLUCOSE 126*   < > 76 177* 187* 175*  --  237*  --   BUN 54*   < > 49* 51* 38* 37*  --  30*  --   CREATININE 1.92*   < > 2.30* 1.93* 1.67* 1.82*  --  1.83*  --   CALCIUM 9.9   < > 8.3* 9.4 8.5* 8.6*  --  8.7*  --   MG 2.4  --   --  2.2  --  2.0  --   --  2.3  PHOS 3.1  --   --  1.7*  --   --  2.1* 3.5 2.3*   < > = values in this interval not displayed.    GFR: Estimated Creatinine Clearance: 35.7 mL/min (A) (by C-G formula based on SCr of 1.83 mg/dL (H)).  Liver Function Tests: Recent Labs  Lab 10/26/23 0444  AST 30  ALT 20  ALKPHOS 173*  BILITOT 0.5  PROT 8.2*  ALBUMIN 3.6   No results for input(s): "LIPASE", "AMYLASE" in the last 168 hours. No results for input(s): "AMMONIA" in the last 168 hours.  Coagulation Profile: Recent Labs  Lab 10/26/23 0632  INR 1.1    Cardiac Enzymes: Recent Labs  Lab 10/26/23 1112  CKTOTAL 79    BNP (last 3 results) No results for input(s): "PROBNP" in the last 8760 hours.  Lipid Profile: Recent Labs    10/27/23 0310  TRIG 116    Thyroid Function Tests: No results  for input(s): "TSH", "T4TOTAL", "FREET4", "T3FREE", "THYROIDAB" in the last 72 hours.  Anemia Panel: No results for input(s): "VITAMINB12", "FOLATE", "FERRITIN", "TIBC", "IRON", "RETICCTPCT" in the last 72 hours.  Urine analysis:    Component Value Date/Time   COLORURINE YELLOW 10/25/2023 1714    APPEARANCEUR CLOUDY (A) 10/25/2023 1714   APPEARANCEUR Clear 03/05/2014 1246   LABSPEC 1.025 10/25/2023 1714   LABSPEC 1.004 03/05/2014 1246   PHURINE 5.0 10/25/2023 1714   GLUCOSEU >=500 (A) 10/25/2023 1714   GLUCOSEU Negative 03/05/2014 1246   HGBUR SMALL (A) 10/25/2023 1714   HGBUR negative 11/30/2010 1511   BILIRUBINUR NEGATIVE 10/25/2023 1714   BILIRUBINUR Negative 03/05/2014 1246   KETONESUR NEGATIVE 10/25/2023 1714   PROTEINUR NEGATIVE 10/25/2023 1714   UROBILINOGEN 1.0 11/30/2010 1511   NITRITE NEGATIVE 10/25/2023 1714   LEUKOCYTESUR LARGE (A) 10/25/2023 1714   LEUKOCYTESUR 2+ 03/05/2014 1246    Sepsis Labs: Lactic Acid, Venous    Component Value Date/Time   LATICACIDVEN 2.4 (HH) 10/26/2023 1112    MICROBIOLOGY: Recent Results (from the past 240 hours)  Culture, blood (routine x 2)     Status: None (Preliminary result)   Collection Time: 10/25/23  5:07 PM   Specimen: BLOOD  Result Value Ref Range Status   Specimen Description BLOOD SITE NOT SPECIFIED  Final   Special Requests   Final    BOTTLES DRAWN AEROBIC AND ANAEROBIC Blood Culture results may not be optimal due to an inadequate volume of blood received in culture bottles   Culture   Final    NO GROWTH 4 DAYS Performed at [facility], [address], [place], [place] [address]    Report Status PENDING  Incomplete  Culture, blood (routine x 2)     Status: None (Preliminary result)   Collection Time: 10/25/23  5:33 PM   Specimen: BLOOD  Result Value Ref Range Status   Specimen Description BLOOD LEFT ANTECUBITAL  Final   Special Requests   Final    BOTTLES DRAWN AEROBIC AND ANAEROBIC Blood Culture results may not be optimal due to an inadequate volume of blood received in culture bottles   Culture   Final    NO GROWTH 4 DAYS Performed at [facility], [address] [place], [place] [address]    Report Status PENDING  Incomplete  Culture, OB Urine     Status: Abnormal   Collection Time:  10/25/23 10:57 PM   Specimen: Urine, Random  Result Value Ref Range Status   Specimen Description URINE, RANDOM  Final   Special Requests   Final    NONE Performed at [facility], [address] [place], [place] [address]    Culture MULTIPLE SPECIES PRESENT, SUGGEST RECOLLECTION (A)  Final   Report Status 10/27/2023 FINAL  Final  Culture, Respiratory w Gram Stain     Status: None   Collection Time: 10/26/23  3:31 AM   Specimen: Tracheal Aspirate; Respiratory  Result Value Ref Range Status   Specimen Description TRACHEAL ASPIRATE  Final   Special Requests NONE  Final   Gram Stain   Final    FEW WBC PRESENT, PREDOMINANTLY PMN FEW GRAM POSITIVE RODS FEW BUDDING YEAST SEEN RARE GRAM NEGATIVE RODS RARE GRAM POSITIVE COCCI    Culture   Final    FEW Normal respiratory flora-no Staph aureus or Pseudomonas seen Performed at [facility], [address], [place], [place] [address]    Report Status 10/28/2023 FINAL  Final  MRSA Next Gen by PCR, Nasal     Status: None   Collection Time: 10/26/23  3:50 AM   Specimen: Nasal Mucosa; Nasal Swab  Result Value Ref Range Status   MRSA by PCR Next Gen NOT DETECTED NOT DETECTED Final    Comment: (NOTE) The GeneXpert MRSA Assay (FDA approved for NASAL specimens only), is one component of a comprehensive MRSA colonization surveillance program. It is not intended to diagnose MRSA infection nor to guide or monitor treatment for MRSA infections. Test performance is not FDA approved in patients less than 71 years old. Performed at [facility], [address], [place], [place] [address]     RADIOLOGY STUDIES/RESULTS: DG Abd Portable 1V Result Date: 10/29/2023 CLINICAL DATA:  Enteric catheter placement EXAM: PORTABLE ABDOMEN - 1 VIEW COMPARISON:  10/27/2023 FINDINGS: Frontal view of the lower chest and upper abdomen demonstrates enteric catheter passing below diaphragm tip overlying the gastric body. There is patchy bibasilar  airspace disease, increased since prior study. Oral contrast is seen within the transverse colon. IMPRESSION: 1. Enteric catheter tip projecting over the gastric body. 2. Progressive bibasilar airspace disease. Electronically Signed   By: Sharlet Salina M.D.   On: 10/29/2023 11:36   DG Swallowing Func-Speech Pathology Result Date: 10/28/2023 Table formatting from the original result was not included. Modified Barium Swallow Study Patient Details Name: JABRIAN DEMATTIA MRN: [id number] Date of Birth: 05-03-1956 Today's Date: 10/28/2023 HPI/PMH: HPI: CARLESS FABRIS is a 66 yo male presenting to ED 12/14 with AMS. Admitted with septic shock due to acute UTI and acute respiratory failure in the setting of PNA (aspiration v community acquired). ETT 12/15-12/17. PMH includes polysubstance abuse, T2DM, HTN, HLD Clinical Impression: Clinical Impression: Pt presents with a severe oropharyngeal dysphagia characterized by impaired timing and sensation. Suspect pt has dysphagia at baseline that is currently exacerbated secondary to intubation, deconditioning, and cognitive deficits. Pt continues to have audible secretions with poor management. Note significant pharyngeal residue and incomplete laryngeal elevation, resulting in repeated aspiration with weak coughing and subsequent aspiration of previously aspirated material mixed with secretions. He aspirated gross amounts of thin, nectar thick, and honey thick liquids before initiating an ineffective cough. Recommend he remain NPO except for 2-3 ice chips following thorough oral care QID. This will aid in loosening secretions and preserving swallow function as SLP initiates therapy targeting oropharyngeal hygiene and cough strength. Will f/u. Factors that may increase risk of adverse event in presence of aspiration Rubye Oaks & Clearance Coots 2021): Factors that may increase risk of adverse event in presence of aspiration Rubye Oaks & Clearance Coots 2021): Poor general health and/or compromised  immunity; Reduced cognitive function; Frail or deconditioned; Inadequate oral hygiene; Weak cough; Aspiration of thick, dense, and/or acidic materials; Frequent aspiration of large volumes Recommendations/Plan: Swallowing Evaluation Recommendations Swallowing Evaluation Recommendations Recommendations: NPO; Alternative means of nutrition - NG Tube; Ice chips PRN after oral care Medication Administration: Via alternative means Oral care recommendations: Oral care QID (4x/day); Oral care before ice chips/water; Use suctioning for oral care Treatment Plan Treatment Plan Treatment recommendations: Therapy as outlined in treatment plan below Follow-up recommendations: Home health SLP Functional status assessment: Patient has had a recent decline in their functional status and demonstrates the ability to make significant improvements in function in a reasonable and predictable amount of time. Treatment frequency: Min 2x/week Treatment duration: 2 weeks Interventions: Aspiration precaution training; Oropharyngeal exercises; Patient/family education; Trials of upgraded texture/liquids; Diet toleration management by SLP; Respiratory muscle strength training  Recommendations Recommendations for follow up therapy are one component of a multi-disciplinary discharge planning process, led by the attending physician.  Recommendations may be updated based on patient status, additional functional criteria and insurance authorization. Assessment: Orofacial Exam: Orofacial Exam Oral Cavity: Oral Hygiene: Lingual coating; Dried secretions Oral Cavity - Dentition: Poor condition; Missing dentition Orofacial Anatomy: WFL Oral Motor/Sensory Function: WFL Anatomy: No data recorded Boluses Administered: Boluses Administered Boluses Administered: Thin liquids (Level 0); Mildly thick liquids (Level 2, nectar thick); Moderately thick liquids (Level 3, honey thick)  Oral Impairment Domain: Oral Impairment Domain Lip Closure: No labial escape  Tongue control during bolus hold: Cohesive bolus between tongue to palatal seal Bolus transport/lingual motion: Brisk tongue motion Oral residue: Trace residue lining oral structures Location of oral residue : Tongue; Palate Initiation of pharyngeal swallow : Pyriform sinuses  Pharyngeal Impairment Domain: Pharyngeal Impairment Domain Soft palate elevation: No bolus between soft palate (SP)/pharyngeal wall (PW) Laryngeal elevation: Partial superior movement of thyroid cartilage/partial approximation of arytenoids to epiglottic petiole Anterior hyoid excursion: Partial anterior movement Epiglottic movement: Partial inversion Laryngeal vestibule closure: Incomplete, narrow column air/contrast in laryngeal vestibule Pharyngeal stripping wave : Present - complete Pharyngeal contraction (A/P view only): N/A Pharyngoesophageal segment opening: Complete distension and complete duration, no obstruction of flow Tongue base retraction: Narrow column of contrast or air between tongue base and PPW Pharyngeal residue: Collection of residue within or on pharyngeal structures Location of pharyngeal residue: Tongue base; Valleculae; Pyriform sinuses  Esophageal Impairment Domain: No data recorded Pill: No data recorded Penetration/Aspiration Scale Score: Penetration/Aspiration Scale Score 7.  Material enters airway, passes BELOW cords and not ejected out despite cough attempt by patient: Thin liquids (Level 0); Mildly thick liquids (Level 2, nectar thick); Moderately thick liquids (Level 3, honey thick) Compensatory Strategies: Compensatory Strategies Compensatory strategies: No   General Information: Caregiver present: No  Diet Prior to this Study: NPO   Temperature : Normal   Respiratory Status: WFL   Supplemental O2: None (Room air)   History of Recent Intubation: Yes  Behavior/Cognition: Alert; Cooperative Self-Feeding Abilities: Needs assist with self-feeding Baseline vocal quality/speech: Dysphonic Volitional Cough: Able to  elicit Volitional Swallow: Able to elicit Exam Limitations: Excessive movement Goal Planning: Prognosis for improved oropharyngeal function: Fair Barriers to Reach Goals: Cognitive deficits; Time post onset; Severity of deficits No data recorded Patient/Family Stated Goal: none stated Consulted and agree with results and recommendations: Patient; Nurse Pain: Pain Assessment Pain Assessment: Faces Faces Pain Scale: 0 Facial Expression: 0 Body Movements: 0 Muscle Tension: 0 Compliance with ventilator (intubated pts.): 0 Vocalization (extubated pts.): N/A CPOT Total: 0 Pain Location: HA Pain Descriptors / Indicators: Aching; Discomfort; Grimacing Pain Intervention(s): Monitored during session End of Session: Start Time:SLP Start Time (ACUTE ONLY): 1417 Stop Time: SLP Stop Time (ACUTE ONLY): 1435 Time Calculation:SLP Time Calculation (min) (ACUTE ONLY): 18 min Charges: SLP Evaluations $ SLP Speech Visit: 1 Visit SLP Evaluations $BSS Swallow: 1 Procedure $MBS Swallow: 1 Procedure SLP visit diagnosis: SLP Visit Diagnosis: Dysphagia, oropharyngeal phase (R13.12) Past Medical History: Past Medical History: Diagnosis Date  COPD (chronic obstructive pulmonary disease) (HCC)   Diabetes mellitus without complication (HCC)   Hypertension  Past Surgical History: No past surgical history on file. Gwynneth Aliment, M.A., CF-SLP Speech Language Pathology, Acute Rehabilitation Services Secure Chat preferred [phone] 10/28/2023, 3:14 PM    LOS: 4 days   Jeoffrey Massed, MD  [facility]    To contact the attending provider between 7A-7P or the covering provider during  after hours 7P-7A, please log into the web site [email or web address] and access using universal [place] password for that web site. If you do not have the password, please call the hospital operator.  10/29/2023, 2:09 PM

## 2023-10-29 NOTE — Progress Notes (Signed)
Pharmacy Antibiotic Note  [name] is a 67 y.o. male admitted on 10/25/2023 with  aspiration pneumonia .  Pharmacy has been consulted for Unasyn dosing.  WBC 19, afebrile. Switching from Zosyn to Unasyn to complete 7 days of abx per MD. Estimated CrCl 35.7 mL/min.  Plan: Discontinue zosyn Start Unasyn 3g IV Q6H - end date of 12/22 F/u renal function, clinical progression, and cultures  Height: 5\' 11"  (180.3 cm) Weight: 62.8 kg (138 lb 7.2 oz) IBW/kg (Calculated) : 75.3  Temp (24hrs), Avg:98.6 F (37 C), Min:97.8 F (36.6 C), Max:99.6 F (37.6 C)  Recent Labs  Lab 10/25/23 1707 10/25/23 1720 10/25/23 1959 10/25/23 2041 10/26/23 0440 10/26/23 0444 10/26/23 0632 10/26/23 1112 10/26/23 1252 10/27/23 0310 10/28/23 0242 10/28/23 0723 10/29/23 0225  WBC 20.7*  --   --   --   --  20.2*  --   --   --   --   --  16.3* 19.0*  CREATININE  --  2.90*   < >  --   --  1.92*  --  2.12* 2.30* 1.93* 1.67* 1.82* 1.83*  LATICACIDVEN  --  3.9*  --  3.6* 3.0*  --  2.4* 2.4*  --   --   --   --   --    < > = values in this interval not displayed.    Estimated Creatinine Clearance: 35.7 mL/min (A) (by C-G formula based on SCr of 1.83 mg/dL (H)).    Allergies  Allergen Reactions   Codeine Phosphate     REACTION: unspecified    Antimicrobials this admission: Unasyn 12/18 >> (12/22) Zosyn 12/15 >> 12/18 Azithro 12/15 > 12/17 Vanco 12/15 x1 Ceftriaxone 12/14 x1  Microbiology results: 12/14 BCx: [id number] 12/15 tracheal aspirate: normal respiratory flora 12/14 ucx: multiple species present 12/15 MRSA swab (-)  Thank you for allowing pharmacy to be a part of this patient's care.  Jenita Seashore 10/29/2023 2:24 PM

## 2023-10-29 NOTE — Consult Note (Signed)
Consultation Note Date: 10/29/2023   Patient Name: [name]  DOB: 1955-12-20  MRN: [id number]  Age / Sex: 67 y.o., male  PCP: [facility] Referring Physician: Maretta Bees, MD  Reason for Consultation: Establishing goals of care  HPI/Patient Profile: 68 y.o. male   admitted on 10/25/2023 with   past medical history of insulin-dependent type 2 diabetes mellitus, hypertension, HLD,  he is  frail and cachectic who presented to [facility] on 12/14 for altered mental status.  Admitted for treatment and stabilization    Hospital  course complicated by hypoxic respiratory failure requiring  intubation.    Successfully extubated  Remains high risk for decompensation.   Patient faces treatment options decisions, advanced directive decisions and anticipator care needs.   Clinical Assessment and Goals of Care:  This NP Lorinda Creed reviewed medical records, received report from team, assessed the patient and spoke to him to discuss diagnosis, prognosis, GOC, EOL wishes disposition and options.         A scheduled meeting was set for 1300 with his ex wife/ main support person/ Sharol Roussel but was not at bedside.     Initially meet  with patient alone.   Concept of Palliative Care was introduced as specialized medical care for people and their families living with serious illness.  If focuses on providing relief from the symptoms and stress of a serious illness.  The goal is to improve quality of life for both the patient and the family.  Values and goals of care important to patient  were attempted to be elicited.  Created space and opportunity for patient to explore thoughts and feelings regarding his current medical situation.  He shares his understanding that his body is declining  "I'm dying".    Mr Vanwagner shares a brief life review.  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 He has a biological son/Vance and a step-son/Steven.  He has has hard times resulting in incarceration, and homelessness.  He shares his love of fishing and reading  He was lost to family for over a year and only recently was found by family,  he was un housed, currently it seems like  he lives with his ex-wife/Rebecca Delton See and her husband Chrissie Noa.         Wife and her two sons come to room and I was able to meet with them later in the afternoon.  Education offered on current medical situation specific to physical and functional  decline, fragility, weakness, dysphagia and overall  failure to thrive.      Extensive education regarding dysphagia,  and the risks and benefits of artifical feeding/ hydration and utilization of  cor-trak/PEG.     Education  offered  today regarding advanced directives.  Concepts specific to code status, artifical feeding and hydration, continued IV antibiotics and rehospitalization was had.  MOST form introduced.   The difference between a aggressive medical intervention path  and a palliative comfort care path for this  patient at this time was had.    [name] shares her current experience with Hospice, caring for her mother in law in her home with [facility].     Patient expresses desire for a comfort path foregoing life prolonging measures , returning to Rebecca's home with hospice services in place, allowing for a natural death.  [name] supports this decision.  MOST form introduced.    Natural trajectory and expectations at EOL were discussed.        With discussion of disposition home, focusing on comfort over the next few days, both sons voiced upsetment and concern,  that he is "not ready to go home, he cannot eat',  they were concerned that we "pushing him out".  They became confrontational, asking this NP to leave the room.   In attempt to de-escalate situation, recommendation is allow  time to outcome and to revisit goals in the  next day or two.     Mr Seliger and his ex-wife apologies for their two sons intimidating behavior.  I did make  charge nurse and Dr Jerral Ralph aware of the situation.  Questions and concerns addressed.  Patient  encouraged to call with questions or concerns.     PMT will continue to support holistically.          Patient's ex wife--Rebecca Andrey Campanile is a person with an established relationship with the patient who is acting in good faith and can reliably convey the wishes of the patient.  Patient communicates that he would want [name] to be his spokes person in the event  he cannot speak for himself.       SUMMARY OF RECOMMENDATIONS    Code Status/Advance Care Planning: DNR/DNI-limited--documented today- all family present verbalize understanding and support of patient's decision    Palliative Prophylaxis:  Aspiration, pain assessment, mouth care  Additional Recommendations (Limitations, Scope, Preferences): Education offered on hospice services  Psycho-social/Spiritual:  Desire for further Chaplaincy support:  yes  Additional Recommendations: emotional support  Prognosis:  Unable to determine  Discharge Planning: to be determined      Primary Diagnoses: Present on Admission:  Hyperglycemia  Acute metabolic encephalopathy  AKI (acute kidney injury) (HCC)  Severe sepsis (HCC)  Acute cystitis  Dehydration  Acute prerenal azotemia  Elevated troponin  Essential hypertension   I have reviewed the medical record, interviewed the patient and family, and examined the patient. The following aspects are pertinent.  Past Medical History:  Diagnosis Date   COPD (chronic obstructive pulmonary disease) (HCC)    Diabetes mellitus without complication (HCC)    Hypertension    Social History   Socioeconomic History   Marital status: Legally Separated    Spouse name: Not on file   Number of children: Not on file   Years of education: Not on file   Highest education  level: Not on file  Occupational History   Not on file  Tobacco Use   Smoking status: Every Day    Current packs/day: 1.00    Types: Cigarettes   Smokeless tobacco: Never  Substance and Sexual Activity   Alcohol use: Not Currently   Drug use: Not Currently    Types: Marijuana   Sexual activity: Not Currently  Other Topics Concern   Not on file  Social History Narrative   Not on file   Social Drivers of Health   Financial Resource Strain: High Risk (08/30/2022)   [facility]   Overall Financial  Resource Strain (CARDIA)    Difficulty of Paying Living Expenses: Very hard  Food Insecurity: Patient Unable To Answer (10/26/2023)   Hunger Vital Sign    Worried About Running Out of Food in the Last Year: Patient unable to answer    Ran Out of Food in the Last Year: Patient unable to answer  Transportation Needs: Patient Unable To Answer (10/26/2023)   PRAPARE - Transportation    Lack of Transportation (Medical): Patient unable to answer    Lack of Transportation (Non-Medical): Patient unable to answer  Physical Activity: Not on file  Stress: Not on file  Social Connections: Not on file   History reviewed. No pertinent family history. Scheduled Meds:  arformoterol  15 mcg Nebulization BID   atorvastatin  20 mg Per Tube Daily   Chlorhexidine Gluconate Cloth  6 each Topical Daily   docusate  100 mg Per Tube BID   famotidine  20 mg Per Tube QHS   feeding supplement (PROSource TF20)  60 mL Per Tube Daily   folic acid  1 mg Per Tube Daily   free water  200 mL Per Tube Q6H   guaiFENesin  10 mL Per Tube BID   heparin injection (subcutaneous)  5,000 Units Subcutaneous Q8H   insulin aspart  0-9 Units Subcutaneous Q4H   multivitamin with minerals  1 tablet Per Tube Daily   nicotine  14 mg Transdermal Daily   mouth rinse  15 mL Mouth Rinse 4 times per day   polyethylene glycol  17 g Per Tube Daily   revefenacin  175 mcg  Nebulization Daily   thiamine  100 mg Per Tube Daily   Continuous Infusions:  sodium chloride 75 mL/hr at 10/29/23 1022   acetaminophen 1,000 mg (10/29/23 0548)   feeding supplement (OSMOLITE 1.5 CAL)     piperacillin-tazobactam (ZOSYN)  IV 3.375 g (10/29/23 0455)   PRN Meds:.acetaminophen (TYLENOL) oral liquid 160 mg/5 mL **OR** acetaminophen, dextrose, fentaNYL (SUBLIMAZE) injection, melatonin, ondansetron (ZOFRAN) IV, mouth rinse, mouth rinse, phenol Medications Prior to Admission:  Prior to Admission medications   Medication Sig Start Date End Date Taking? Authorizing Provider  acetaminophen (TYLENOL) 500 MG tablet Take 500-1,000 mg by mouth every 6 (six) hours as needed for moderate pain (pain score 4-6).   Yes [provider]  amLODipine (NORVASC) 10 MG tablet Take 1 tablet (10 mg total) by mouth daily. 10/01/23 10/31/23 Yes Tegeler, Canary Brim, MD  atorvastatin (LIPITOR) 20 MG tablet TAKE 1 TABLET BY MOUTH DAILY FOR CHOLESTEROL 10/01/23  Yes Tegeler, Canary Brim, MD  carvedilol (COREG) 6.25 MG tablet Take 1 tablet (6.25 mg total) by mouth daily. 10/01/23 10/31/23 Yes Tegeler, Canary Brim, MD  guaiFENesin (MUCUS RELIEF ADULT PO) Take 1 tablet by mouth every 12 (twelve) hours as needed (Mucus).   Yes [provider]  Insulin Glargine (BASAGLAR KWIKPEN) 100 UNIT/ML Inject 16 Units into the skin at bedtime. 10/01/23 01/02/24 Yes Tegeler, Canary Brim, MD  lansoprazole (PREVACID) 15 MG capsule Take 15 mg by mouth every 4 (four) hours as needed (Indigestion).   Yes [provider]  Insulin Pen Needle (BD PEN NEEDLE NANO U/F) 32G X 4 MM MISC To use with glargine pen. Substitutions as needed are allowed. 10/01/23 10/31/23  Tegeler, Canary Brim, MD   Allergies  Allergen Reactions   Codeine Phosphate     REACTION: unspecified   Review of Systems  Neurological:  Positive for weakness.    Physical Exam Constitutional:  Appearance: He is cachectic.  He is ill-appearing.  Cardiovascular:     Rate and Rhythm: Tachycardia present.  Pulmonary:     Effort: Tachypnea present.  Musculoskeletal:     Comments: Generalized weakness and muscle atrophy   Neurological:     Mental Status: He is alert.     Vital Signs: BP (!) 150/93 (BP Location: Left Arm)   Pulse (!) 118   Temp 98 F (36.7 C) (Oral)   Resp (!) 30   Ht 5\' 11"  (1.803 m)   Wt 62.8 kg   SpO2 94%   BMI 19.31 kg/m  Pain Scale: 0-10   Pain Score: 0-No pain   SpO2: SpO2: 94 % O2 Device:SpO2: 94 % O2 Flow Rate: .O2 Flow Rate (L/min): 4 L/min (pt sats 90 after tx. RN notified increased oxygen to 4L.)  IO: Intake/output summary:  Intake/Output Summary (Last 24 hours) at 10/29/2023 1122 Last data filed at 10/29/2023 4098 Gross per 24 hour  Intake 590.96 ml  Output 2265 ml  Net -1674.04 ml    LBM: Last BM Date : 10/29/23 (flexiseal) Baseline Weight: Weight: 56.7 kg Most recent weight: Weight: 62.8 kg     Palliative Assessment/Data: 50% at best   PMT will continue to support holistically   Time 120 minutes  Discussed with Dr Jerral Ralph, charge nurse   Signed by: Lorinda Creed, NP   Please contact Palliative Medicine Team phone at [phone] for questions and concerns.  For individual provider: See Loretha Stapler

## 2023-10-30 ENCOUNTER — Inpatient Hospital Stay (HOSPITAL_COMMUNITY): Payer: Medicare Other

## 2023-10-30 DIAGNOSIS — R739 Hyperglycemia, unspecified: Secondary | ICD-10-CM | POA: Diagnosis not present

## 2023-10-30 DIAGNOSIS — N3000 Acute cystitis without hematuria: Secondary | ICD-10-CM | POA: Diagnosis not present

## 2023-10-30 DIAGNOSIS — N19 Unspecified kidney failure: Secondary | ICD-10-CM | POA: Diagnosis not present

## 2023-10-30 DIAGNOSIS — G9341 Metabolic encephalopathy: Secondary | ICD-10-CM | POA: Diagnosis not present

## 2023-10-30 LAB — COMPREHENSIVE METABOLIC PANEL
ALT: 36 U/L (ref 0–44)
AST: 33 U/L (ref 15–41)
Albumin: 2.6 g/dL — ABNORMAL LOW (ref 3.5–5.0)
Alkaline Phosphatase: 128 U/L — ABNORMAL HIGH (ref 38–126)
Anion gap: 10 (ref 5–15)
BUN: 40 mg/dL — ABNORMAL HIGH (ref 8–23)
CO2: 20 mmol/L — ABNORMAL LOW (ref 22–32)
Calcium: 9.1 mg/dL (ref 8.9–10.3)
Chloride: 120 mmol/L — ABNORMAL HIGH (ref 98–111)
Creatinine, Ser: 1.78 mg/dL — ABNORMAL HIGH (ref 0.61–1.24)
GFR, Estimated: 42 mL/min — ABNORMAL LOW (ref >60)
Glucose, Bld: 365 mg/dL — ABNORMAL HIGH (ref 70–99)
Potassium: 3 mmol/L — ABNORMAL LOW (ref 3.5–5.1)
Sodium: 150 mmol/L — ABNORMAL HIGH (ref 135–145)
Total Bilirubin: 1.1 mg/dL (ref <1.2)
Total Protein: 6.9 g/dL (ref 6.5–8.1)

## 2023-10-30 LAB — CBC
HCT: 41.3 % (ref 39.0–52.0)
Hemoglobin: 13.5 g/dL (ref 13.0–17.0)
MCH: 30.4 pg (ref 26.0–34.0)
MCHC: 32.7 g/dL (ref 30.0–36.0)
MCV: 93 fL (ref 80.0–100.0)
Platelets: 152 10*3/uL (ref 150–400)
RBC: 4.44 MIL/uL (ref 4.22–5.81)
RDW: 14.5 % (ref 11.5–15.5)
WBC: 15 10*3/uL — ABNORMAL HIGH (ref 4.0–10.5)
nRBC: 0.1 % (ref 0.0–0.2)

## 2023-10-30 LAB — CULTURE, BLOOD (ROUTINE X 2)
Culture: NO GROWTH
Culture: NO GROWTH

## 2023-10-30 LAB — MAGNESIUM
Magnesium: 2.3 mg/dL (ref 1.7–2.4)
Magnesium: 2.1 mg/dL (ref 1.7–2.4)

## 2023-10-30 LAB — GLUCOSE, CAPILLARY
Glucose-Capillary: 353 mg/dL — ABNORMAL HIGH (ref 70–99)
Glucose-Capillary: 306 mg/dL — ABNORMAL HIGH (ref 70–99)
Glucose-Capillary: 344 mg/dL — ABNORMAL HIGH (ref 70–99)
Glucose-Capillary: 200 mg/dL — ABNORMAL HIGH (ref 70–99)
Glucose-Capillary: 280 mg/dL — ABNORMAL HIGH (ref 70–99)

## 2023-10-30 LAB — PHOSPHORUS
Phosphorus: 1.3 mg/dL — ABNORMAL LOW (ref 2.5–4.6)
Phosphorus: 2.2 mg/dL — ABNORMAL LOW (ref 2.5–4.6)

## 2023-10-30 LAB — PROCALCITONIN: Procalcitonin: 3.23 ng/mL

## 2023-10-30 MED ORDER — POTASSIUM PHOSPHATES 15 MMOLE/5ML IV SOLN
30.0000 mmol | Freq: Once | INTRAVENOUS | Status: AC
  Administered 2023-10-30: 30 mmol via INTRAVENOUS
  Filled 2023-10-30: qty 10

## 2023-10-30 MED ORDER — FREE WATER
400.0000 mL | Freq: Four times a day (QID) | Status: DC
  Administered 2023-10-30 – 2023-10-31 (×4): 400 mL

## 2023-10-30 MED ORDER — INSULIN GLARGINE-YFGN 100 UNIT/ML ~~LOC~~ SOLN
10.0000 [IU] | Freq: Every day | SUBCUTANEOUS | Status: DC
  Administered 2023-10-30: 10 [IU] via SUBCUTANEOUS
  Filled 2023-10-30 (×2): qty 0.1

## 2023-10-30 MED ORDER — POTASSIUM CHLORIDE 20 MEQ PO PACK
40.0000 meq | PACK | ORAL | Status: AC
  Administered 2023-10-30 (×2): 40 meq
  Filled 2023-10-30 (×2): qty 2

## 2023-10-30 MED ORDER — DEXTROSE 5 % IV SOLN: INTRAVENOUS | Status: AC

## 2023-10-30 MED ORDER — INSULIN ASPART 100 UNIT/ML IJ SOLN
0.0000 [IU] | INTRAMUSCULAR | Status: DC
  Administered 2023-10-30: 8 [IU] via SUBCUTANEOUS
  Administered 2023-10-30: 3 [IU] via SUBCUTANEOUS
  Administered 2023-10-30 (×2): 11 [IU] via SUBCUTANEOUS
  Administered 2023-10-31: 15 [IU] via SUBCUTANEOUS
  Administered 2023-10-31: 11 [IU] via SUBCUTANEOUS

## 2023-10-30 MED ORDER — TAMSULOSIN HCL 0.4 MG PO CAPS
0.4000 mg | ORAL_CAPSULE | Freq: Every day | ORAL | Status: DC
  Administered 2023-10-31 – 2023-11-01 (×2): 0.4 mg via ORAL
  Filled 2023-10-30 (×2): qty 1

## 2023-10-30 MED ORDER — POTASSIUM PHOSPHATES 15 MMOLE/5ML IV SOLN: 30.0000 mmol | Freq: Once | INTRAVENOUS | Status: DC

## 2023-10-30 NOTE — Plan of Care (Signed)
  Problem: Education: Goal: Knowledge of General Education information will improve Description: Including pain rating scale, medication(s)/side effects and non-pharmacologic comfort measures Outcome: Progressing   Problem: Health Behavior/Discharge Planning: Goal: Ability to manage health-related needs will improve Outcome: Progressing   Problem: Clinical Measurements: Goal: Ability to maintain clinical measurements within normal limits will improve Outcome: Progressing Goal: Will remain free from infection Outcome: Progressing Goal: Diagnostic test results will improve Outcome: Progressing Goal: Respiratory complications will improve Outcome: Progressing Goal: Cardiovascular complication will be avoided Outcome: Progressing   Problem: Activity: Goal: Risk for activity intolerance will decrease Outcome: Progressing   Problem: Nutrition: Goal: Adequate nutrition will be maintained Outcome: Progressing   Problem: Coping: Goal: Level of anxiety will decrease Outcome: Progressing   Problem: Elimination: Goal: Will not experience complications related to bowel motility Outcome: Progressing Goal: Will not experience complications related to urinary retention Outcome: Progressing   Problem: Pain Management: Goal: General experience of comfort will improve Outcome: Progressing   Problem: Safety: Goal: Ability to remain free from injury will improve Outcome: Progressing   Problem: Skin Integrity: Goal: Risk for impaired skin integrity will decrease Outcome: Progressing   Problem: Education: Goal: Ability to describe self-care measures that may prevent or decrease complications (Diabetes Survival Skills Education) will improve Outcome: Progressing Goal: Individualized Educational Video(s) Outcome: Progressing   Problem: Coping: Goal: Ability to adjust to condition or change in health will improve Outcome: Progressing   Problem: Fluid Volume: Goal: Ability to  maintain a balanced intake and output will improve Outcome: Progressing   Problem: Health Behavior/Discharge Planning: Goal: Ability to identify and utilize available resources and services will improve Outcome: Progressing Goal: Ability to manage health-related needs will improve Outcome: Progressing   Problem: Metabolic: Goal: Ability to maintain appropriate glucose levels will improve Outcome: Progressing   Problem: Nutritional: Goal: Maintenance of adequate nutrition will improve Outcome: Progressing Goal: Progress toward achieving an optimal weight will improve Outcome: Progressing   Problem: Skin Integrity: Goal: Risk for impaired skin integrity will decrease Outcome: Progressing   Problem: Tissue Perfusion: Goal: Adequacy of tissue perfusion will improve Outcome: Progressing   Problem: Activity: Goal: Ability to tolerate increased activity will improve Outcome: Progressing   Problem: Respiratory: Goal: Ability to maintain a clear airway and adequate ventilation will improve Outcome: Progressing   Problem: Role Relationship: Goal: Method of communication will improve Outcome: Progressing

## 2023-10-30 NOTE — Inpatient Diabetes Management (Signed)
Inpatient Diabetes Program Recommendations  AACE/ADA: New Consensus Statement on Inpatient Glycemic Control (2015)  Target Ranges:  Prepandial:   less than 140 mg/dL      Peak postprandial:   less than 180 mg/dL (1-2 hours)      Critically ill patients:  140 - 180 mg/dL   Lab Results  Component Value Date   GLUCAP 306 (H) 10/30/2023   HGBA1C 14.2 (H) 10/25/2023    Review of Glycemic Control  Diabetes history: type 2? Outpatient Diabetes medications: Basaglar 16 units at HS Current orders for Inpatient glycemic control: Semglee 10 units at HS, Novolog 0-15 units correction scale every 4 hours.   Inpatient Diabetes Program Recommendations:   Spoke to patient at the bedside. Patient was having trouble talking at times. He did say that he was diagnosed with diabetes about 1 year ago; had started on oral medications, but most recently on Basaglar 16 units every night. Stated that his exwife has been helping him with his insulin. Reviewed his Hgba1C of 14.2%. He did not know what that meant. Explained to him that his blood sugars had been running >400 mg/dl on an average over the last 2-3 months. Has not been checking blood sugars at home. States that he was scheduled for surgery in January, 2025 at the [place]. His PCP is not with the [place].   Will continue to monitor blood sugars while in the hospital.  Smith Mince RN BSN CDE Diabetes Coordinator Pager: [phone]  8am-5pm

## 2023-10-30 NOTE — Progress Notes (Signed)
Physical Therapy Treatment Patient Details Name: [name] MRN: [id number] DOB: 04-Apr-1956 Today's Date: 10/30/2023   History of Present Illness 67 yo male admitted 12/14 with AMS and hyperglycemia with CBG >600. 12/15 pt with respiratory failure and thick secretions requiring intubation and bronch. 12/17 extubated. PMHx: IDDM, HTN, HLD, COPD    PT Comments  Pt received in supine and agreeable to session. Pt reports feeling restless at the beginning of the session, however limits mobility during session. Pt able to sit to EOB with CGA and stand with min A. Pt demonstrates posterior lean in standing requiring up to min A to maintain balance despite cues for anterior weight shift. Pt reports dizziness requiring a seated rest break. Pt able to stand again with heavy encouragement and take a few side steps towards [facility] with min A for RW management before returning to supine. Pt requires assist with line management and cues for safety throughout session. Pt continues to benefit from PT services to progress toward functional mobility goals.     If plan is discharge home, recommend the following: A little help with bathing/dressing/bathroom;A little help with walking and/or transfers;Assistance with cooking/housework;Direct supervision/assist for financial management;Supervision due to cognitive status;Assist for transportation   Can travel by private vehicle        Equipment Recommendations  BSC/3in1    Recommendations for Other Services       Precautions / Restrictions Precautions Precautions: Fall Restrictions Weight Bearing Restrictions Per Provider Order: No     Mobility  Bed Mobility Overal bed mobility: Needs Assistance Bed Mobility: Supine to Sit, Sit to Supine     Supine to sit: Contact guard, HOB elevated Sit to supine: Contact guard assist   General bed mobility comments: increased time/effort and assist for line management    Transfers Overall transfer level: Needs  assistance Equipment used: Rolling walker (2 wheels) Transfers: Sit to/from Stand Sit to Stand: Min assist           General transfer comment: from EOB x2 with min A for power up and anterior weight shift    Ambulation/Gait               General Gait Details: pt declines due to fatigue and dizziness      Balance Overall balance assessment: Needs assistance Sitting-balance support: No upper extremity supported, Feet supported Sitting balance-Leahy Scale: Fair Sitting balance - Comments: sitting EOB   Standing balance support: Bilateral upper extremity supported Standing balance-Leahy Scale: Poor Standing balance comment: with RW support and min A                            Cognition Arousal: Alert Behavior During Therapy: WFL for tasks assessed/performed Overall Cognitive Status: Impaired/Different from baseline                                 General Comments: pt requires increased cues for safety and instruction throughout session        Exercises      General Comments General comments (skin integrity, edema, etc.): Pt reporting dizziness in sitting and standing, however pt requesting to return to supine prior to BP being taken      Pertinent Vitals/Pain Pain Assessment Pain Assessment: Faces Faces Pain Scale: Hurts a little bit Pain Location: R hand Pain Descriptors / Indicators: Aching Pain Intervention(s): Monitored during session     PT  Goals (current goals can now be found in the care plan section) Acute Rehab PT Goals Patient Stated Goal: return home, read mystery PT Goal Formulation: With patient Time For Goal Achievement: 11/11/23 Progress towards PT goals: Progressing toward goals (slowly)    Frequency    Min 1X/week       AM-PAC PT "6 Clicks" Mobility   Outcome Measure  Help needed turning from your back to your side while in a flat bed without using bedrails?: A Little Help needed moving from lying  on your back to sitting on the side of a flat bed without using bedrails?: A Little Help needed moving to and from a bed to a chair (including a wheelchair)?: A Little Help needed standing up from a chair using your arms (e.g., wheelchair or bedside chair)?: A Little Help needed to walk in hospital room?: A Lot Help needed climbing 3-5 steps with a railing? : A Lot 6 Click Score: 16    End of Session Equipment Utilized During Treatment: Gait belt Activity Tolerance: Other (comment);Patient limited by fatigue (limited by dizziness) Patient left: in bed;with call bell/phone within reach;with bed alarm set Nurse Communication: Mobility status PT Visit Diagnosis: Other abnormalities of gait and mobility (R26.89);Difficulty in walking, not elsewhere classified (R26.2);History of falling (Z91.81)     Time: 4010-2725 PT Time Calculation (min) (ACUTE ONLY): 28 min  Charges:    $Therapeutic Activity: 23-37 mins PT General Charges $$ ACUTE PT VISIT: 1 Visit                     Johny Shock, PTA Acute Rehabilitation Services Secure Chat Preferred  [phone]    Johny Shock 10/30/2023, 10:45 AM

## 2023-10-30 NOTE — Progress Notes (Addendum)
Speech Language Pathology Treatment: Dysphagia  Patient Details Name: [name] MRN: [id number] DOB: 10/01/1956 Today's Date: 10/30/2023 Time: 1601-0932 SLP Time Calculation (min) (ACUTE ONLY): 14 min  Assessment / Plan / Recommendation Clinical Impression  Pt confused and agitated with poor safety awareness. Observed with ice chips x3 followed by immediate coughing and a wet vocal quality. Prompted pt to participate in two sets of EMST x5 reps set to 5 cmH2O. Provided education regarding reasoning for NPO status and plan to repeat MBS. Pending completion, recommend he remain NPO except for 2-3 ice chips only following thorough oral care QID. SLP will f/u, likely next date for MBS.    HPI HPI: [name] is a 67 yo male presenting to ED 12/14 with AMS. Admitted with septic shock due to acute UTI and acute respiratory failure in the setting of PNA (aspiration v community acquired). ETT 12/15-12/17. PMH includes polysubstance abuse, T2DM, HTN, HLD      SLP Plan  MBS      Recommendations for follow up therapy are one component of a multi-disciplinary discharge planning process, led by the attending physician.  Recommendations may be updated based on patient status, additional functional criteria and insurance authorization.    Recommendations  Diet recommendations: NPO Medication Administration: Via alternative means                  Oral care QID;Staff/trained caregiver to provide oral care   Frequent or constant Supervision/Assistance Dysphagia, oropharyngeal phase (R13.12)     MBS     Gwynneth Aliment, M.A., CF-SLP Speech Language Pathology, Acute Rehabilitation Services  Secure Chat preferred [phone]   10/30/2023, 3:43 PM

## 2023-10-30 NOTE — Progress Notes (Addendum)
PROGRESS NOTE        PATIENT DETAILS Name: [name] Age: 67 y.o. Sex: male Date of Birth: 03-26-1956 Admit Date: 10/25/2023 Admitting Physician Angie Fava, DO ZOX:WRUEAV, [facility]  Brief Summary: Patient is a 67 y.o.  male with history of DM-2, HTN, HLD, frail/cachectic at baseline-presented with altered mental status-found to have HHS-septic shock secondary to PNA/UTI-intubated-and admitted to the ICU.  Briefly required vasopressors in the ICU.  Extubated on 12/17-and transferred to [facility].    Significant events: 12/14>> to ED-altered mental status-admit to [facility]-HHS-started on insulin drip-severe sepsis due to UTI. 12/15>> worsening shortness of breath- intubated-admit to ICU-bronchoscopy-extensive white thick secretions. 12/16>> off pressors 12/17>> extubated 12/18>> transferred to [facility]  Significant studies: 11/19>> CTA chest: Stable aneurysmal disease of distal aortic arch and proximal descending thoracic aorta-5 cm in maximal diameter. 12/14>> CXR: No acute changes.-Massively dilated aortic arch. 12/16>> echo: EF 55-60%.  Significant microbiology data: 12/14>> blood culture: No growth 12/14>> urine culture: Multiple species 12/15>> tracheal aspirate: Normal respiratory flora  Procedures: 12/15-12/17>> ETT 12/15>> bronchoscopy: Extensive white secretions 12/18>>Cortrak tube  Consults: PCCM  Subjective: Asking to eat-but understands that he is needs to remain n.p.o. to minimize aspiration risk.  No other issues overnight.  Objective: Vitals: Blood pressure (!) 165/96, pulse (!) 104, temperature 97.9 F (36.6 C), temperature source Oral, resp. rate 18, height 5\' 11"  (1.803 m), weight 65.8 kg, SpO2 94%.   Exam: Gen Exam:Alert awake-not in any distress HEENT:atraumatic, normocephalic Chest: Bibasilar rales CVS:S1S2 regular Abdomen:soft non tender, non distended Extremities:no edema Neurology: Non focal-but with  generalized weakness. Skin: no rash  Pertinent Labs/Radiology:    Latest Ref Rng & Units 10/30/2023    5:08 AM 10/29/2023    2:25 AM 10/28/2023    7:23 AM  CBC  WBC 4.0 - 10.5 K/uL 15.0  19.0  16.3   Hemoglobin 13.0 - 17.0 g/dL 40.9  81.1  91.4   Hematocrit 39.0 - 52.0 % 41.3  35.7  32.0   Platelets 150 - 400 K/uL 152  111  87     Lab Results  Component Value Date   NA 150 (H) 10/30/2023   K 3.0 (L) 10/30/2023   CL 120 (H) 10/30/2023   CO2 20 (L) 10/30/2023      Assessment/Plan: Acute hypoxic respiratory failure secondary to PNA Intubated on admission-suspicion for aspiration Overall improved with IV antibiotics-now on room air. Remains on Unasyn-7 total days planned Remains n.p.o.-SLP following-Cortrak tube in place.  Septic shock secondary to PNA/complicated UTI Required pressors in the ICU Sepsis physiology has resolved On empiric antibiotics as above  Acute metabolic encephalopathy Secondary to HHS/septic shock Resolved-completely awake and alertc  AKI Suspect hemodynamically mediated Slowly improving Avoid nephrotoxic agents  Hypernatremia Secondary to free water deficit-n.p.o.-secondary to oropharyngeal dysphagia Cortrak placed 12/18-increase free water to 400 cc every 6, gently hydrate with D5W today for a few hours Repeat electrolytes tomorrow.   Hypokalemia/hypophosphatemia Replete/recheck  HTN BP stable Continue Coreg  Aortic aneurysm Recent CTA prior to this hospitalization with significant dilatation of the distal aortic arch Beta-blocker Currently not in any shape for vascular surgery evaluation-suspect this will need to be pursued in the outpatient setting-as this is a chronic issue  HLD Statin  HHS DM-2 (A1c 14.2 on 12/14) with uncontrolled hyperglycemia CBGs on the higher side Start Semglee 10 units  Change SSI to moderate scale Follow closely.  Recent Labs    10/29/23 2351 10/30/23 0341 10/30/23 0752  GLUCAP 336* 353* 306*      COPD Stable Bronchodilators  GERD Pepcid  Oropharyngeal dysphagia Likely due to severe debility/deconditioning Cortrak tube placed 12/18 SLP following-remains n.p.o.  Debility/deconditioning PT/OT eval-suspect will require SNF  Nutrition Status: Nutrition Problem: Severe Malnutrition Etiology: chronic illness Signs/Symptoms: severe muscle depletion, severe fat depletion Interventions: Refer to RD note for recommendations  Code status:   Code Status: Limited: Do not attempt resuscitation (DNR) -DNR-LIMITED -Do Not Intubate/DNI    DVT Prophylaxis: heparin injection 5,000 Units Start: 10/26/23 1400 SCDs Start: 10/25/23 2232   Family Communication: Ex Wife [name] Nelson-[phone]-called on 12/19-unable to leave voicemail-mailbox full.   Disposition Plan: Status is: Inpatient Remains inpatient appropriate because: Severe of illness   Planned Discharge Destination:Skilled nursing facility   Diet: Diet Order             Diet NPO time specified  Diet effective now                     Antimicrobial agents: Anti-infectives (From admission, onward)    Start     Dose/Rate Route Frequency Ordered Stop   10/29/23 2000  Ampicillin-Sulbactam (UNASYN) 3 g in sodium chloride 0.9 % 100 mL IVPB        3 g 200 mL/hr over 30 Minutes Intravenous Every 6 hours 10/29/23 1422 11/02/23 0759   10/28/23 0500  vancomycin (VANCOREADY) IVPB 750 mg/150 mL  Status:  Discontinued        750 mg 150 mL/hr over 60 Minutes Intravenous Every 48 hours 10/26/23 0406 10/26/23 0810   10/26/23 2200  cefTRIAXone (ROCEPHIN) 2 g in sodium chloride 0.9 % 100 mL IVPB  Status:  Discontinued        2 g 200 mL/hr over 30 Minutes Intravenous Every 24 hours 10/26/23 0010 10/26/23 0334   10/26/23 1200  piperacillin-tazobactam (ZOSYN) IVPB 3.375 g  Status:  Discontinued        3.375 g 12.5 mL/hr over 240 Minutes Intravenous Every 8 hours 10/26/23 0810 10/29/23 1422   10/26/23 0900  azithromycin  (ZITHROMAX) 500 mg in sodium chloride 0.9 % 250 mL IVPB        500 mg 250 mL/hr over 60 Minutes Intravenous Every 24 hours 10/26/23 0807 10/28/23 0925   10/26/23 0445  piperacillin-tazobactam (ZOSYN) IVPB 2.25 g  Status:  Discontinued        2.25 g 100 mL/hr over 30 Minutes Intravenous Every 8 hours 10/26/23 0351 10/26/23 0810   10/26/23 0445  vancomycin (VANCOREADY) IVPB 1250 mg/250 mL        1,250 mg 166.7 mL/hr over 90 Minutes Intravenous  Once 10/26/23 0351 10/26/23 0607   10/25/23 2200  cefTRIAXone (ROCEPHIN) 2 g in sodium chloride 0.9 % 100 mL IVPB  Status:  Discontinued        2 g 200 mL/hr over 30 Minutes Intravenous Every 24 hours 10/25/23 2154 10/26/23 0009        MEDICATIONS: Scheduled Meds:  arformoterol  15 mcg Nebulization BID   atorvastatin  20 mg Per Tube Daily   carvedilol  6.25 mg Per Tube Daily   Chlorhexidine Gluconate Cloth  6 each Topical Daily   docusate  100 mg Per Tube BID   famotidine  20 mg Per Tube QHS   feeding supplement (PROSource TF20)  60 mL Per Tube Daily   folic acid  1  mg Per Tube Daily   free water  400 mL Per Tube Q6H   guaiFENesin  10 mL Per Tube BID   heparin injection (subcutaneous)  5,000 Units Subcutaneous Q8H   insulin aspart  0-15 Units Subcutaneous Q4H   insulin glargine-yfgn  10 Units Subcutaneous Daily   multivitamin with minerals  1 tablet Per Tube Daily   nicotine  14 mg Transdermal Daily   mouth rinse  15 mL Mouth Rinse 4 times per day   polyethylene glycol  17 g Per Tube Daily   potassium chloride  40 mEq Per Tube Q4H   revefenacin  175 mcg Nebulization Daily   thiamine  100 mg Per Tube Daily   Continuous Infusions:  ampicillin-sulbactam (UNASYN) IV 3 g (10/30/23 0848)   dextrose 50 mL/hr at 10/30/23 0925   feeding supplement (OSMOLITE 1.5 CAL) 45 mL/hr at 10/29/23 1802   potassium PHOSPHATE 30 mmol in dextrose 5 % 250 mL infusion 30 mmol (10/30/23 0918)   PRN Meds:.acetaminophen (TYLENOL) oral liquid 160 mg/5 mL **OR**  acetaminophen, dextrose, fentaNYL (SUBLIMAZE) injection, melatonin, ondansetron (ZOFRAN) IV, mouth rinse, mouth rinse, phenol   I have personally reviewed following labs and imaging studies  LABORATORY DATA: CBC: Recent Labs  Lab 10/25/23 1707 10/25/23 1720 10/26/23 0444 10/26/23 0602 10/26/23 0719 10/28/23 0723 10/29/23 0225 10/30/23 0508  WBC 20.7*  --  20.2*  --   --  16.3* 19.0* 15.0*  NEUTROABS 17.3*  --  15.8*  --   --   --   --   --   HGB 17.1*   < > 17.2* 15.3 15.6 10.6* 11.9* 13.5  HCT 52.6*   < > 51.7 45.0 46.0 32.0* 35.7* 41.3  MCV 94.1  --  93.0  --   --  93.0 92.5 93.0  PLT 235  --  209  --   --  87* 111* 152   < > = values in this interval not displayed.    Basic Metabolic Panel: Recent Labs  Lab 10/27/23 0310 10/28/23 0242 10/28/23 0723 10/28/23 0729 10/29/23 0225 10/29/23 1139 10/29/23 1644 10/30/23 0508  NA 149* 147* 146*  --  150*  --   --  150*  K 3.4* 3.2* 4.1  --  3.3*  --   --  3.0*  CL 116* 117* 115*  --  115*  --   --  120*  CO2 21* 20* 24  --  17*  --   --  20*  GLUCOSE 177* 187* 175*  --  237*  --   --  365*  BUN 51* 38* 37*  --  30*  --   --  40*  CREATININE 1.93* 1.67* 1.82*  --  1.83*  --   --  1.78*  CALCIUM 9.4 8.5* 8.6*  --  8.7*  --   --  9.1  MG 2.2  --  2.0  --   --  2.3 2.2 2.3  PHOS 1.7*  --   --  2.1* 3.5 2.3* 3.5 1.3*    GFR: Estimated Creatinine Clearance: 38.5 mL/min (A) (by C-G formula based on SCr of 1.78 mg/dL (H)).  Liver Function Tests: Recent Labs  Lab 10/26/23 0444 10/30/23 0508  AST 30 33  ALT 20 36  ALKPHOS 173* 128*  BILITOT 0.5 1.1  PROT 8.2* 6.9  ALBUMIN 3.6 2.6*   No results for input(s): "LIPASE", "AMYLASE" in the last 168 hours. No results for input(s): "AMMONIA" in the last 168 hours.  Coagulation Profile: Recent Labs  Lab 10/26/23 0632  INR 1.1    Cardiac Enzymes: Recent Labs  Lab 10/26/23 1112  CKTOTAL 79    BNP (last 3 results) No results for input(s): "PROBNP" in the last  8760 hours.  Lipid Profile: No results for input(s): "CHOL", "HDL", "LDLCALC", "TRIG", "CHOLHDL", "LDLDIRECT" in the last 72 hours.   Thyroid Function Tests: No results for input(s): "TSH", "T4TOTAL", "FREET4", "T3FREE", "THYROIDAB" in the last 72 hours.  Anemia Panel: No results for input(s): "VITAMINB12", "FOLATE", "FERRITIN", "TIBC", "IRON", "RETICCTPCT" in the last 72 hours.  Urine analysis:    Component Value Date/Time   COLORURINE YELLOW 10/25/2023 1714   APPEARANCEUR CLOUDY (A) 10/25/2023 1714   APPEARANCEUR Clear 03/05/2014 1246   LABSPEC 1.025 10/25/2023 1714   LABSPEC 1.004 03/05/2014 1246   PHURINE 5.0 10/25/2023 1714   GLUCOSEU >=500 (A) 10/25/2023 1714   GLUCOSEU Negative 03/05/2014 1246   HGBUR SMALL (A) 10/25/2023 1714   HGBUR negative 11/30/2010 1511   BILIRUBINUR NEGATIVE 10/25/2023 1714   BILIRUBINUR Negative 03/05/2014 1246   KETONESUR NEGATIVE 10/25/2023 1714   PROTEINUR NEGATIVE 10/25/2023 1714   UROBILINOGEN 1.0 11/30/2010 1511   NITRITE NEGATIVE 10/25/2023 1714   LEUKOCYTESUR LARGE (A) 10/25/2023 1714   LEUKOCYTESUR 2+ 03/05/2014 1246    Sepsis Labs: Lactic Acid, Venous    Component Value Date/Time   LATICACIDVEN 2.4 (HH) 10/26/2023 1112    MICROBIOLOGY: Recent Results (from the past 240 hours)  Culture, blood (routine x 2)     Status: None   Collection Time: 10/25/23  5:07 PM   Specimen: BLOOD  Result Value Ref Range Status   Specimen Description BLOOD SITE NOT SPECIFIED  Final   Special Requests   Final    BOTTLES DRAWN AEROBIC AND ANAEROBIC Blood Culture results may not be optimal due to an inadequate volume of blood received in culture bottles   Culture   Final    NO GROWTH 5 DAYS Performed at [facility], [address], [place], [place] [address]    Report Status 10/30/2023 FINAL  Final  Culture, blood (routine x 2)     Status: None   Collection Time: 10/25/23  5:33 PM   Specimen: BLOOD  Result Value Ref Range Status    Specimen Description BLOOD LEFT ANTECUBITAL  Final   Special Requests   Final    BOTTLES DRAWN AEROBIC AND ANAEROBIC Blood Culture results may not be optimal due to an inadequate volume of blood received in culture bottles   Culture   Final    NO GROWTH 5 DAYS Performed at [facility], [address] [place], [place] [address]    Report Status 10/30/2023 FINAL  Final  Culture, OB Urine     Status: Abnormal   Collection Time: 10/25/23 10:57 PM   Specimen: Urine, Random  Result Value Ref Range Status   Specimen Description URINE, RANDOM  Final   Special Requests   Final    NONE Performed at [facility], [address], [place], [place] [address]    Culture MULTIPLE SPECIES PRESENT, SUGGEST RECOLLECTION (A)  Final   Report Status 10/27/2023 FINAL  Final  Culture, Respiratory w Gram Stain     Status: None   Collection Time: 10/26/23  3:31 AM   Specimen: Tracheal Aspirate; Respiratory  Result Value Ref Range Status   Specimen Description TRACHEAL ASPIRATE  Final   Special Requests NONE  Final   Gram Stain  Final    FEW WBC PRESENT, PREDOMINANTLY PMN FEW GRAM POSITIVE RODS FEW BUDDING YEAST SEEN RARE GRAM NEGATIVE RODS RARE GRAM POSITIVE COCCI    Culture   Final    FEW Normal respiratory flora-no Staph aureus or Pseudomonas seen Performed at [facility], [address], [place], [place] [address]    Report Status 10/28/2023 FINAL  Final  MRSA Next Gen by PCR, Nasal     Status: None   Collection Time: 10/26/23  3:50 AM   Specimen: Nasal Mucosa; Nasal Swab  Result Value Ref Range Status   MRSA by PCR Next Gen NOT DETECTED NOT DETECTED Final    Comment: (NOTE) The GeneXpert MRSA Assay (FDA approved for NASAL specimens only), is one component of a comprehensive MRSA colonization surveillance program. It is not intended to diagnose MRSA infection nor to guide or monitor treatment for MRSA infections. Test performance is not FDA approved in patients less than  96 years old. Performed at [facility], [address], [place], [place] [address]     RADIOLOGY STUDIES/RESULTS: DG Chest Port 1 View Result Date: 10/30/2023 CLINICAL DATA:  Short of breath, COPD EXAM: PORTABLE CHEST 1 VIEW COMPARISON:  10/27/2023 FINDINGS: Single frontal view of the chest demonstrates interval removal of the endotracheal tube. Enteric catheter passes below diaphragm tip excluded by collimation. Cardiac silhouette is stable. Continued enlargement of the aortic knob. Scattered bilateral airspace disease again noted, greatest at the left lung base, without appreciable change since prior study. No effusion or pneumothorax. No acute bony abnormalities. IMPRESSION: 1. Scattered bilateral airspace disease, greatest at the left lung base, compatible with pneumonia. 2. Stable enlargement of the aortic knob consistent with known history of thoracic aortic aneurysm. Electronically Signed   By: Sharlet Salina M.D.   On: 10/30/2023 09:56   DG Abd Portable 1V Result Date: 10/29/2023 CLINICAL DATA:  Enteric catheter placement EXAM: PORTABLE ABDOMEN - 1 VIEW COMPARISON:  10/27/2023 FINDINGS: Frontal view of the lower chest and upper abdomen demonstrates enteric catheter passing below diaphragm tip overlying the gastric body. There is patchy bibasilar airspace disease, increased since prior study. Oral contrast is seen within the transverse colon. IMPRESSION: 1. Enteric catheter tip projecting over the gastric body. 2. Progressive bibasilar airspace disease. Electronically Signed   By: Sharlet Salina M.D.   On: 10/29/2023 11:36   DG Swallowing Func-Speech Pathology Result Date: 10/28/2023 Table formatting from the original result was not included. Modified Barium Swallow Study Patient Details Name: EUGUNE LOCKNER MRN: [id number] Date of Birth: 16-Aug-1956 Today's Date: 10/28/2023 HPI/PMH: HPI: TRUMAN TUDISCO is a 67 yo male presenting to ED 12/14 with AMS. Admitted with septic shock due to acute  UTI and acute respiratory failure in the setting of PNA (aspiration v community acquired). ETT 12/15-12/17. PMH includes polysubstance abuse, T2DM, HTN, HLD Clinical Impression: Clinical Impression: Pt presents with a severe oropharyngeal dysphagia characterized by impaired timing and sensation. Suspect pt has dysphagia at baseline that is currently exacerbated secondary to intubation, deconditioning, and cognitive deficits. Pt continues to have audible secretions with poor management. Note significant pharyngeal residue and incomplete laryngeal elevation, resulting in repeated aspiration with weak coughing and subsequent aspiration of previously aspirated material mixed with secretions. He aspirated gross amounts of thin, nectar thick, and honey thick liquids before initiating an ineffective cough. Recommend he remain NPO except for 2-3 ice chips following thorough oral care QID. This will aid in loosening secretions and preserving swallow function as SLP  initiates therapy targeting oropharyngeal hygiene and cough strength. Will f/u. Factors that may increase risk of adverse event in presence of aspiration Rubye Oaks & Clearance Coots 2021): Factors that may increase risk of adverse event in presence of aspiration Rubye Oaks & Clearance Coots 2021): Poor general health and/or compromised immunity; Reduced cognitive function; Frail or deconditioned; Inadequate oral hygiene; Weak cough; Aspiration of thick, dense, and/or acidic materials; Frequent aspiration of large volumes Recommendations/Plan: Swallowing Evaluation Recommendations Swallowing Evaluation Recommendations Recommendations: NPO; Alternative means of nutrition - NG Tube; Ice chips PRN after oral care Medication Administration: Via alternative means Oral care recommendations: Oral care QID (4x/day); Oral care before ice chips/water; Use suctioning for oral care Treatment Plan Treatment Plan Treatment recommendations: Therapy as outlined in treatment plan below Follow-up  recommendations: Home health SLP Functional status assessment: Patient has had a recent decline in their functional status and demonstrates the ability to make significant improvements in function in a reasonable and predictable amount of time. Treatment frequency: Min 2x/week Treatment duration: 2 weeks Interventions: Aspiration precaution training; Oropharyngeal exercises; Patient/family education; Trials of upgraded texture/liquids; Diet toleration management by SLP; Respiratory muscle strength training Recommendations Recommendations for follow up therapy are one component of a multi-disciplinary discharge planning process, led by the attending physician.  Recommendations may be updated based on patient status, additional functional criteria and insurance authorization. Assessment: Orofacial Exam: Orofacial Exam Oral Cavity: Oral Hygiene: Lingual coating; Dried secretions Oral Cavity - Dentition: Poor condition; Missing dentition Orofacial Anatomy: WFL Oral Motor/Sensory Function: WFL Anatomy: No data recorded Boluses Administered: Boluses Administered Boluses Administered: Thin liquids (Level 0); Mildly thick liquids (Level 2, nectar thick); Moderately thick liquids (Level 3, honey thick)  Oral Impairment Domain: Oral Impairment Domain Lip Closure: No labial escape Tongue control during bolus hold: Cohesive bolus between tongue to palatal seal Bolus transport/lingual motion: Brisk tongue motion Oral residue: Trace residue lining oral structures Location of oral residue : Tongue; Palate Initiation of pharyngeal swallow : Pyriform sinuses  Pharyngeal Impairment Domain: Pharyngeal Impairment Domain Soft palate elevation: No bolus between soft palate (SP)/pharyngeal wall (PW) Laryngeal elevation: Partial superior movement of thyroid cartilage/partial approximation of arytenoids to epiglottic petiole Anterior hyoid excursion: Partial anterior movement Epiglottic movement: Partial inversion Laryngeal vestibule  closure: Incomplete, narrow column air/contrast in laryngeal vestibule Pharyngeal stripping wave : Present - complete Pharyngeal contraction (A/P view only): N/A Pharyngoesophageal segment opening: Complete distension and complete duration, no obstruction of flow Tongue base retraction: Narrow column of contrast or air between tongue base and PPW Pharyngeal residue: Collection of residue within or on pharyngeal structures Location of pharyngeal residue: Tongue base; Valleculae; Pyriform sinuses  Esophageal Impairment Domain: No data recorded Pill: No data recorded Penetration/Aspiration Scale Score: Penetration/Aspiration Scale Score 7.  Material enters airway, passes BELOW cords and not ejected out despite cough attempt by patient: Thin liquids (Level 0); Mildly thick liquids (Level 2, nectar thick); Moderately thick liquids (Level 3, honey thick) Compensatory Strategies: Compensatory Strategies Compensatory strategies: No   General Information: Caregiver present: No  Diet Prior to this Study: NPO   Temperature : Normal   Respiratory Status: WFL   Supplemental O2: None (Room air)   History of Recent Intubation: Yes  Behavior/Cognition: Alert; Cooperative Self-Feeding Abilities: Needs assist with self-feeding Baseline vocal quality/speech: Dysphonic Volitional Cough: Able to elicit Volitional Swallow: Able to elicit Exam Limitations: Excessive movement Goal Planning: Prognosis for improved oropharyngeal function: Fair Barriers to Reach Goals: Cognitive deficits; Time post onset; Severity of deficits No data recorded Patient/Family Stated Goal: none stated  Consulted and agree with results and recommendations: Patient; Nurse Pain: Pain Assessment Pain Assessment: Faces Faces Pain Scale: 0 Facial Expression: 0 Body Movements: 0 Muscle Tension: 0 Compliance with ventilator (intubated pts.): 0 Vocalization (extubated pts.): N/A CPOT Total: 0 Pain Location: HA Pain Descriptors / Indicators: Aching; Discomfort; Grimacing  Pain Intervention(s): Monitored during session End of Session: Start Time:SLP Start Time (ACUTE ONLY): 1417 Stop Time: SLP Stop Time (ACUTE ONLY): 1435 Time Calculation:SLP Time Calculation (min) (ACUTE ONLY): 18 min Charges: SLP Evaluations $ SLP Speech Visit: 1 Visit SLP Evaluations $BSS Swallow: 1 Procedure $MBS Swallow: 1 Procedure SLP visit diagnosis: SLP Visit Diagnosis: Dysphagia, oropharyngeal phase (R13.12) Past Medical History: Past Medical History: Diagnosis Date  COPD (chronic obstructive pulmonary disease) (HCC)   Diabetes mellitus without complication (HCC)   Hypertension  Past Surgical History: No past surgical history on file. Gwynneth Aliment, M.A., CF-SLP Speech Language Pathology, Acute Rehabilitation Services Secure Chat preferred [phone] 10/28/2023, 3:14 PM    LOS: 5 days   Jeoffrey Massed, MD  [facility]    To contact the attending provider between 7A-7P or the covering provider during after hours 7P-7A, please log into the web site [email or web address] and access using universal [place] password for that web site. If you do not have the password, please call the hospital operator.  10/30/2023, 11:36 AM

## 2023-10-30 NOTE — Progress Notes (Signed)
Occupational Therapy Treatment Patient Details Name: [name] MRN: [id number] DOB: 1956-06-18 Today's Date: 10/30/2023   History of present illness 67 yo male admitted 12/14 with AMS and hyperglycemia with CBG >600. 12/15 pt with respiratory failure and thick secretions requiring intubation and bronch. 12/17 extubated. PMHx: IDDM, HTN, HLD, COPD   OT comments  With encouragement, pt participated in grooming and UB dressing seated at EOB with set up to min assist. He stood x 1 with min assist and RW and side stepped to reposition toward [facility] prior to return to supine. Pt declined further activity, did not offer reason as to why he wanted to return to supine. Pt with decreased awareness of lines. Continue to recommend HHOT and 24 hour care.       If plan is discharge home, recommend the following:  A little help with walking and/or transfers;A lot of help with bathing/dressing/bathroom;Assistance with cooking/housework;Assistance with feeding;Direct supervision/assist for medications management;Direct supervision/assist for financial management;Assist for transportation;Help with stairs or ramp for entrance   Equipment Recommendations  BSC/3in1    Recommendations for Other Services      Precautions / Restrictions Precautions Precautions: Fall Restrictions Weight Bearing Restrictions Per Provider Order: No       Mobility Bed Mobility Overal bed mobility: Needs Assistance Bed Mobility: Supine to Sit, Sit to Supine     Supine to sit: Contact guard, HOB elevated Sit to supine: Contact guard assist   General bed mobility comments: increased time/effort and assist for line management    Transfers Overall transfer level: Needs assistance Equipment used: Rolling walker (2 wheels) Transfers: Sit to/from Stand Sit to Stand: Min assist           General transfer comment: x 1, assist to rise and steady, posterior bias     Balance Overall balance assessment: Needs  assistance   Sitting balance-Leahy Scale: Fair     Standing balance support: Bilateral upper extremity supported Standing balance-Leahy Scale: Poor Standing balance comment: with RW support and min A                           ADL either performed or assessed with clinical judgement   ADL Overall ADL's : Needs assistance/impaired     Grooming: Set up;Oral care;Sitting;Wash/dry hands;Wash/dry face           Upper Body Dressing : Minimal assistance;Sitting                   Functional mobility during ADLs: Minimal assistance;Rolling walker (2 wheels)      Extremity/Trunk Assessment              Vision       Perception     Praxis      Cognition Arousal: Alert Behavior During Therapy: WFL for tasks assessed/performed Overall Cognitive Status: Impaired/Different from baseline Area of Impairment: Memory, Following commands, Safety/judgement, Problem solving, Attention, Awareness                   Current Attention Level: Sustained Memory: Decreased short-term memory Following Commands: Follows one step commands inconsistently, Follows one step commands with increased time Safety/Judgement: Decreased awareness of safety, Decreased awareness of deficits   Problem Solving: Slow processing, Requires verbal cues, Requires tactile cues          Exercises      Shoulder Instructions       General Comments Pt reporting dizziness in sitting and standing, however  pt requesting to return to supine prior to BP being taken    Pertinent Vitals/ Pain       Pain Assessment Pain Assessment: Faces Faces Pain Scale: Hurts a little bit Pain Location: R hand Pain Descriptors / Indicators: Aching Pain Intervention(s): Monitored during session, Repositioned  Home Living                                          Prior Functioning/Environment              Frequency  Min 1X/week        Progress Toward Goals  OT  Goals(current goals can now be found in the care plan section)  Progress towards OT goals: Progressing toward goals  Acute Rehab OT Goals OT Goal Formulation: With patient Time For Goal Achievement: 11/11/23 Potential to Achieve Goals: Good  Plan      Co-evaluation                 AM-PAC OT "6 Clicks" Daily Activity     Outcome Measure   Help from another person eating meals?: Total Help from another person taking care of personal grooming?: A Little Help from another person toileting, which includes using toliet, bedpan, or urinal?: Total Help from another person bathing (including washing, rinsing, drying)?: A Lot Help from another person to put on and taking off regular upper body clothing?: A Little Help from another person to put on and taking off regular lower body clothing?: A Little 6 Click Score: 13    End of Session Equipment Utilized During Treatment: Gait belt;Rolling walker (2 wheels)  OT Visit Diagnosis: Unsteadiness on feet (R26.81);Muscle weakness (generalized) (M62.81);Other symptoms and signs involving cognitive function;History of falling (Z91.81)   Activity Tolerance Patient tolerated treatment well   Patient Left in bed;with call bell/phone within reach;with bed alarm set   Nurse Communication          Time: 1884-1660 OT Time Calculation (min): 17 min  Charges: OT General Charges $OT Visit: 1 Visit OT Treatments $Self Care/Home Management : 8-22 mins Berna Spare, OTR/L Acute Rehabilitation Services Office: [phone]   Evern Bio 10/30/2023, 1:10 PM

## 2023-10-31 ENCOUNTER — Inpatient Hospital Stay (HOSPITAL_COMMUNITY): Payer: Medicare Other

## 2023-10-31 DIAGNOSIS — N19 Unspecified kidney failure: Secondary | ICD-10-CM | POA: Diagnosis not present

## 2023-10-31 DIAGNOSIS — R4182 Altered mental status, unspecified: Secondary | ICD-10-CM | POA: Diagnosis not present

## 2023-10-31 DIAGNOSIS — R739 Hyperglycemia, unspecified: Secondary | ICD-10-CM | POA: Diagnosis not present

## 2023-10-31 DIAGNOSIS — Z7189 Other specified counseling: Secondary | ICD-10-CM

## 2023-10-31 DIAGNOSIS — Z66 Do not resuscitate: Secondary | ICD-10-CM | POA: Diagnosis not present

## 2023-10-31 DIAGNOSIS — Z515 Encounter for palliative care: Secondary | ICD-10-CM | POA: Diagnosis not present

## 2023-10-31 DIAGNOSIS — N3000 Acute cystitis without hematuria: Secondary | ICD-10-CM | POA: Diagnosis not present

## 2023-10-31 DIAGNOSIS — G9341 Metabolic encephalopathy: Secondary | ICD-10-CM | POA: Diagnosis not present

## 2023-10-31 LAB — CBC
HCT: 41.7 % (ref 39.0–52.0)
Hemoglobin: 13.2 g/dL (ref 13.0–17.0)
MCH: 30.4 pg (ref 26.0–34.0)
MCHC: 31.7 g/dL (ref 30.0–36.0)
MCV: 96.1 fL (ref 80.0–100.0)
Platelets: 145 10*3/uL — ABNORMAL LOW (ref 150–400)
RBC: 4.34 MIL/uL (ref 4.22–5.81)
RDW: 14.8 % (ref 11.5–15.5)
WBC: 16.3 10*3/uL — ABNORMAL HIGH (ref 4.0–10.5)
nRBC: 0 % (ref 0.0–0.2)

## 2023-10-31 LAB — BASIC METABOLIC PANEL
Anion gap: 17 — ABNORMAL HIGH (ref 5–15)
BUN: 37 mg/dL — ABNORMAL HIGH (ref 8–23)
CO2: 14 mmol/L — ABNORMAL LOW (ref 22–32)
Calcium: 9.4 mg/dL (ref 8.9–10.3)
Chloride: 121 mmol/L — ABNORMAL HIGH (ref 98–111)
Creatinine, Ser: 1.64 mg/dL — ABNORMAL HIGH (ref 0.61–1.24)
GFR, Estimated: 46 mL/min — ABNORMAL LOW (ref >60)
Glucose, Bld: 325 mg/dL — ABNORMAL HIGH (ref 70–99)
Potassium: 5.1 mmol/L (ref 3.5–5.1)
Sodium: 152 mmol/L — ABNORMAL HIGH (ref 135–145)

## 2023-10-31 LAB — GLUCOSE, CAPILLARY
Glucose-Capillary: 162 mg/dL — ABNORMAL HIGH (ref 70–99)
Glucose-Capillary: 205 mg/dL — ABNORMAL HIGH (ref 70–99)
Glucose-Capillary: 249 mg/dL — ABNORMAL HIGH (ref 70–99)
Glucose-Capillary: 312 mg/dL — ABNORMAL HIGH (ref 70–99)
Glucose-Capillary: 322 mg/dL — ABNORMAL HIGH (ref 70–99)
Glucose-Capillary: 366 mg/dL — ABNORMAL HIGH (ref 70–99)

## 2023-10-31 LAB — PHOSPHORUS: Phosphorus: 1.5 mg/dL — ABNORMAL LOW (ref 2.5–4.6)

## 2023-10-31 MED ORDER — FREE WATER
500.0000 mL | Status: DC
  Administered 2023-10-31 – 2023-11-02 (×11): 500 mL

## 2023-10-31 MED ORDER — SODIUM CHLORIDE 0.45 % IV SOLN
INTRAVENOUS | Status: DC
Start: 2023-10-31 — End: 2023-11-01

## 2023-10-31 MED ORDER — CARVEDILOL 6.25 MG PO TABS
6.2500 mg | ORAL_TABLET | Freq: Every day | ORAL | Status: DC
  Administered 2023-10-31 – 2023-11-02 (×3): 6.25 mg
  Filled 2023-10-31 (×4): qty 1

## 2023-10-31 MED ORDER — INSULIN GLARGINE-YFGN 100 UNIT/ML ~~LOC~~ SOLN
18.0000 [IU] | Freq: Every day | SUBCUTANEOUS | Status: DC
  Administered 2023-10-31 – 2023-11-01 (×2): 18 [IU] via SUBCUTANEOUS
  Filled 2023-10-31 (×2): qty 0.18

## 2023-10-31 MED ORDER — INSULIN ASPART 100 UNIT/ML IJ SOLN
3.0000 [IU] | Freq: Three times a day (TID) | INTRAMUSCULAR | Status: DC
  Administered 2023-10-31 – 2023-11-02 (×5): 3 [IU] via SUBCUTANEOUS

## 2023-10-31 MED ORDER — INSULIN ASPART 100 UNIT/ML IJ SOLN
0.0000 [IU] | INTRAMUSCULAR | Status: DC
  Administered 2023-10-31: 15 [IU] via SUBCUTANEOUS
  Administered 2023-10-31: 4 [IU] via SUBCUTANEOUS
  Administered 2023-10-31 (×2): 7 [IU] via SUBCUTANEOUS
  Administered 2023-11-01 (×2): 11 [IU] via SUBCUTANEOUS
  Administered 2023-11-01 (×2): 7 [IU] via SUBCUTANEOUS
  Administered 2023-11-01: 11 [IU] via SUBCUTANEOUS
  Administered 2023-11-02: 4 [IU] via SUBCUTANEOUS
  Administered 2023-11-02 (×2): 11 [IU] via SUBCUTANEOUS

## 2023-10-31 MED ORDER — SODIUM PHOSPHATES 45 MMOLE/15ML IV SOLN
30.0000 mmol | Freq: Once | INTRAVENOUS | Status: AC
  Administered 2023-10-31: 30 mmol via INTRAVENOUS
  Filled 2023-10-31: qty 10

## 2023-10-31 MED ORDER — FREE WATER
500.0000 mL | Freq: Four times a day (QID) | Status: DC
  Administered 2023-10-31: 500 mL

## 2023-10-31 NOTE — Progress Notes (Signed)
Physical Therapy Treatment Patient Details Name: [name] MRN: [id number] DOB: 07-11-1956 Today's Date: 10/31/2023   History of Present Illness 67 yo male admitted 12/14 with AMS and hyperglycemia with CBG >600. 12/15 pt with respiratory failure and thick secretions requiring intubation and bronch. 12/17 extubated. PMHx: IDDM, HTN, HLD, COPD    PT Comments  Pt received with bed alarm going off and attempted to get of bed. Pt stated that he needed to go to restroom and urinal was provided however pt did not void. Pt then required Min A to stand with RW and take sidesteps along however pt was noted to be very confused with very poor command following. No change in DC/DME recs at this time. PT will continue to follow.     If plan is discharge home, recommend the following: A little help with bathing/dressing/bathroom;A little help with walking and/or transfers;Assistance with cooking/housework;Direct supervision/assist for financial management;Supervision due to cognitive status;Assist for transportation   Can travel by private vehicle        Equipment Recommendations  BSC/3in1    Recommendations for Other Services       Precautions / Restrictions Precautions Precautions: Fall Restrictions Weight Bearing Restrictions Per Provider Order: No     Mobility  Bed Mobility Overal bed mobility: Needs Assistance Bed Mobility: Sit to Supine       Sit to supine: Min assist   General bed mobility comments: increased time/effort and assist for line management. Min A BLE management.    Transfers Overall transfer level: Needs assistance Equipment used: Rolling walker (2 wheels) Transfers: Sit to/from Stand Sit to Stand: Min assist           General transfer comment: Min A to power up and take sidesteps. Pt not following commands so ambulation deferred.    Ambulation/Gait                   Stairs             Wheelchair Mobility     Tilt Bed    Modified  Rankin (Stroke Patients Only)       Balance Overall balance assessment: Needs assistance Sitting-balance support: No upper extremity supported, Feet supported Sitting balance-Leahy Scale: Fair Sitting balance - Comments: sitting EOB   Standing balance support: Bilateral upper extremity supported Standing balance-Leahy Scale: Poor Standing balance comment: with RW support and min A                            Cognition Arousal: Alert Behavior During Therapy: Flat affect Overall Cognitive Status: Impaired/Different from baseline Area of Impairment: Memory, Following commands, Safety/judgement, Problem solving, Attention, Awareness                   Current Attention Level: Sustained Memory: Decreased short-term memory Following Commands: Follows one step commands inconsistently, Follows one step commands with increased time Safety/Judgement: Decreased awareness of safety, Decreased awareness of deficits Awareness: Intellectual, Emergent Problem Solving: Slow processing, Requires verbal cues, Requires tactile cues General Comments: Pt very flat today. Received attempting to get of OOB and noted with poor command following.        Exercises      General Comments General comments (skin integrity, edema, etc.): VSS      Pertinent Vitals/Pain Pain Assessment Pain Assessment: No/denies pain    Home Living  Prior Function            PT Goals (current goals can now be found in the care plan section) Progress towards PT goals: Progressing toward goals    Frequency    Min 1X/week      PT Plan      Co-evaluation              AM-PAC PT "6 Clicks" Mobility   Outcome Measure  Help needed turning from your back to your side while in a flat bed without using bedrails?: A Little Help needed moving from lying on your back to sitting on the side of a flat bed without using bedrails?: A Little Help needed moving  to and from a bed to a chair (including a wheelchair)?: A Little Help needed standing up from a chair using your arms (e.g., wheelchair or bedside chair)?: A Little Help needed to walk in hospital room?: A Lot Help needed climbing 3-5 steps with a railing? : A Lot 6 Click Score: 16    End of Session Equipment Utilized During Treatment: Gait belt Activity Tolerance: Patient limited by fatigue Patient left: in bed;with call bell/phone within reach;with bed alarm set Nurse Communication: Mobility status PT Visit Diagnosis: Other abnormalities of gait and mobility (R26.89);Difficulty in walking, not elsewhere classified (R26.2);History of falling (Z91.81)     Time: 1610-9604 PT Time Calculation (min) (ACUTE ONLY): 8 min  Charges:    $Therapeutic Activity: 8-22 mins PT General Charges $$ ACUTE PT VISIT: 1 Visit                     Shela Nevin, PT, DPT Acute Rehab Services [phone]    Gladys Damme 10/31/2023, 4:50 PM

## 2023-10-31 NOTE — Progress Notes (Signed)
Modified Barium Swallow Study  Patient Details  Name: [name] MRN: [id number] Date of Birth: 1956-06-23  Today's Date: 10/31/2023  Modified Barium Swallow completed.  Full report located under Chart Review in the Imaging Section.  History of Present Illness [name] is a 67 yo male presenting to ED 12/14 with AMS. Admitted with septic shock due to acute UTI and acute respiratory failure in the setting of PNA (aspiration v community acquired). ETT 12/15-12/17. PMH includes polysubstance abuse, T2DM, HTN, HLD   Clinical Impression Pt continues to present with a severe oropharyngeal dysphagia with little to no improvement since previous MBS now further complicated by pt's participation. He was not responsive to reasoning and did not elicit a cued cough or swallow throughout the entirety of the study. He held boluses in his oral cavity for prolonged amounts of time and refused to expectorate. Pt silently aspirated thin and nectar thick liquids. The majority of each bolus resides in pt's valleculae and pyriform sinuses and pt is unable to participate in use of compensatory strategies to clear. This results in airway invasion after the swallow as well with residuals of all boluses, including purees. Safest diet recommendation would be to continue NPO status. Discussed with MD, who states pt's family is now willing to accept risk of aspiration. SLP will initiate a diet and s/o. Factors that may increase risk of adverse event in presence of aspiration [employer] 2021): Poor general health and/or compromised immunity;Reduced cognitive function;Frail or deconditioned;Inadequate oral hygiene;Weak cough;Aspiration of thick, dense, and/or acidic materials;Frequent aspiration of large volumes  Swallow Evaluation Recommendations Recommendations: PO diet PO Diet Recommendation: Regular;Thin liquids (Level 0) Liquid Administration via: Cup;Straw Medication Administration: Crushed with  puree Supervision: Patient able to self-feed;Full supervision/cueing for swallowing strategies Swallowing strategies  : Minimize environmental distractions;Slow rate;Small bites/sips Postural changes: Position pt fully upright for meals;Stay upright 30-60 min after meals Oral care recommendations: Oral care QID (4x/day);Oral care before ice chips/water;Use suctioning for oral care      Gwynneth Aliment, M.A., CF-SLP Speech Language Pathology, Acute Rehabilitation Services  Secure Chat preferred [phone]  10/31/2023,2:50 PM

## 2023-10-31 NOTE — Progress Notes (Signed)
PROGRESS NOTE        PATIENT DETAILS Name: [name] Age: 67 y.o. Sex: male Date of Birth: [birth year] Admit Date: 10/25/2023 Admitting Physician Angie Fava, DO JYN:WGNFAO, [facility]  Brief Summary: Patient is a 67 y.o.  male with history of DM-2, HTN, HLD, frail/cachectic at baseline-presented with altered mental status-found to have HHS-septic shock secondary to PNA/UTI-intubated-and admitted to the ICU.  Briefly required vasopressors in the ICU.  Extubated on 12/17-and transferred to [facility].    Significant events: 12/14>> to ED-altered mental status-admit to TR[facility]-HHS-started on insulin drip-severe sepsis due to UTI. 12/15>> worsening shortness of breath- intubated-admit to ICU-bronchoscopy-extensive white thick secretions. 12/16>> off pressors 12/17>> extubated 12/18>> transferred to [facility]  Significant studies: 11/19>> CTA chest: Stable aneurysmal disease of distal aortic arch and proximal descending thoracic aorta-5 cm in maximal diameter. 12/14>> CXR: No acute changes.-Massively dilated aortic arch. 12/16>> echo: EF 55-60%.  Significant microbiology data: 12/14>> blood culture: No growth 12/14>> urine culture: Multiple species 12/15>> tracheal aspirate: Normal respiratory flora  Procedures: 12/15-12/17>> ETT 12/15>> bronchoscopy: Extensive white secretions 12/18>>Cortrak tube  Consults: PCCM  Subjective: No complaints-for modified barium swallow today.  Objective: Vitals: Blood pressure 139/87, pulse (!) 102, temperature 98 F (36.7 C), temperature source Oral, resp. rate 18, height 5\' 11"  (1.803 m), weight 58.5 kg, SpO2 95%.   Exam: Gen Exam:Alert awake-not in any distress HEENT:atraumatic, normocephalic Chest: B/L clear to auscultation anteriorly CVS:S1S2 regular Abdomen:soft non tender, non distended Extremities:no edema Neurology: Non focal Skin: no rash  Pertinent Labs/Radiology:    Latest Ref Rng & Units  10/31/2023    2:33 AM 10/30/2023    5:08 AM 10/29/2023    2:25 AM  CBC  WBC 4.0 - 10.5 K/uL 16.3  15.0  19.0   Hemoglobin 13.0 - 17.0 g/dL 13.0  86.5  78.4   Hematocrit 39.0 - 52.0 % 41.7  41.3  35.7   Platelets 150 - 400 K/uL 145  152  111     Lab Results  Component Value Date   NA 152 (H) 10/31/2023   K 5.1 10/31/2023   CL 121 (H) 10/31/2023   CO2 14 (L) 10/31/2023      Assessment/Plan: Acute hypoxic respiratory failure secondary to PNA Intubated on admission-suspicion for aspiration Overall improved with IV antibiotics-now on room air. Remains on Unasyn-7 total days planned N.p.o-Cortrak tube in place-MBS for today-await further recommendations from SLP.  Septic shock secondary to PNA/complicated UTI Required pressors in the ICU Sepsis physiology has resolved On empiric antibiotics as above  Acute metabolic encephalopathy Secondary to HHS/septic shock Resolved-completely awake and alertc  AKI Suspect hemodynamically mediated Slowly improving Avoid nephrotoxic agents  Hypernatremia Secondary to free water deficit-n.p.o.-secondary to oropharyngeal dysphagia In spite of being on D5W and free water via Cortrak tube-sodium levels are essentially unchanged-will increase free water to 500 cc every 4 hours, and will place him on half-normal saline infusion for several hours.  Avoid D5W due to hyperglycemia. Repeat electrolytes tomorrow.  Hypokalemia Repleted  Hypophosphatemia Replete/recheck  HTN BP stable Continue Coreg  Aortic aneurysm Recent CTA prior to this hospitalization with significant dilatation of the distal aortic arch Beta-blocker Currently not in any shape for vascular surgery evaluation-suspect this will need to be pursued in the outpatient setting-as this is a chronic issue  HLD Statin  HHS DM-2 (A1c 14.2 on  12/14) with uncontrolled hyperglycemia CBGs on the higher side Increase Semglee to 18 units Start scheduled Premeal NovoLog-3 units   Change SSI to resistant scale Follow closely.  Recent Labs    10/31/23 0513 10/31/23 0751 10/31/23 1145  GLUCAP 322* 249* 312*     COPD Stable Bronchodilators  GERD Pepcid  Oropharyngeal dysphagia Likely due to severe debility/deconditioning Cortrak tube placed 12/18 SLP following-remain nothing by mouth-modified barium swallow scheduled for later today  Addendum: MBS results discussed with SLP-remains at risk for aspiration-subsequently called patient's ex-wife Rebecca-patient has been asking for food for the past several days-she is okay with placing him on a diet-knowing fully well that he may aspirate-she is accepting all risks.  Debility/deconditioning PT/OT eval-SNF contemplated-however family looking to take him home with hospice  Palliative care DNR in place Palliative care team following-they had a long discussion with the spouse today-plans are for home with hospice care.  In the interim-continue with antibiotics/IV fluids as detailed above.  See above regarding diet-family accepting aspiration risk.  Nutrition Status: Nutrition Problem: Severe Malnutrition Etiology: chronic illness Signs/Symptoms: severe muscle depletion, severe fat depletion Interventions: Refer to RD note for recommendations  Code status:   Code Status: Limited: Do not attempt resuscitation (DNR) -DNR-LIMITED -Do Not Intubate/DNI    DVT Prophylaxis: heparin injection 5,000 Units Start: 10/26/23 1400 SCDs Start: 10/25/23 2232   Family Communication: Ex Wife [name] Nelson-[phone]-called on 12/19-unable to leave voicemail-mailbox full.   Disposition Plan: Status is: Inpatient Remains inpatient appropriate because: Severe of illness   Planned Discharge Destination:Skilled nursing facility   Diet: Diet Order             Diet NPO time specified  Diet effective now                     Antimicrobial agents: Anti-infectives (From admission, onward)    Start      Dose/Rate Route Frequency Ordered Stop   10/29/23 2000  Ampicillin-Sulbactam (UNASYN) 3 g in sodium chloride 0.9 % 100 mL IVPB        3 g 200 mL/hr over 30 Minutes Intravenous Every 6 hours 10/29/23 1422 11/02/23 0759   10/28/23 0500  vancomycin (VANCOREADY) IVPB 750 mg/150 mL  Status:  Discontinued        750 mg 150 mL/hr over 60 Minutes Intravenous Every 48 hours 10/26/23 0406 10/26/23 0810   10/26/23 2200  cefTRIAXone (ROCEPHIN) 2 g in sodium chloride 0.9 % 100 mL IVPB  Status:  Discontinued        2 g 200 mL/hr over 30 Minutes Intravenous Every 24 hours 10/26/23 0010 10/26/23 0334   10/26/23 1200  piperacillin-tazobactam (ZOSYN) IVPB 3.375 g  Status:  Discontinued        3.375 g 12.5 mL/hr over 240 Minutes Intravenous Every 8 hours 10/26/23 0810 10/29/23 1422   10/26/23 0900  azithromycin (ZITHROMAX) 500 mg in sodium chloride 0.9 % 250 mL IVPB        500 mg 250 mL/hr over 60 Minutes Intravenous Every 24 hours 10/26/23 0807 10/28/23 0925   10/26/23 0445  piperacillin-tazobactam (ZOSYN) IVPB 2.25 g  Status:  Discontinued        2.25 g 100 mL/hr over 30 Minutes Intravenous Every 8 hours 10/26/23 0351 10/26/23 0810   10/26/23 0445  vancomycin (VANCOREADY) IVPB 1250 mg/250 mL        1,250 mg 166.7 mL/hr over 90 Minutes Intravenous  Once 10/26/23 0351 10/26/23 0607   10/25/23 2200  cefTRIAXone (ROCEPHIN) 2 g in sodium chloride 0.9 % 100 mL IVPB  Status:  Discontinued        2 g 200 mL/hr over 30 Minutes Intravenous Every 24 hours 10/25/23 2154 10/26/23 0009        MEDICATIONS: Scheduled Meds:  arformoterol  15 mcg Nebulization BID   atorvastatin  20 mg Per Tube Daily   carvedilol  6.25 mg Per Tube Daily   Chlorhexidine Gluconate Cloth  6 each Topical Daily   docusate  100 mg Per Tube BID   famotidine  20 mg Per Tube QHS   feeding supplement (PROSource TF20)  60 mL Per Tube Daily   folic acid  1 mg Per Tube Daily   free water  500 mL Per Tube Q6H   guaiFENesin  10 mL Per Tube  BID   heparin injection (subcutaneous)  5,000 Units Subcutaneous Q8H   insulin aspart  0-20 Units Subcutaneous Q4H   insulin aspart  3 Units Subcutaneous TID WC   insulin glargine-yfgn  18 Units Subcutaneous Daily   multivitamin with minerals  1 tablet Per Tube Daily   nicotine  14 mg Transdermal Daily   mouth rinse  15 mL Mouth Rinse 4 times per day   polyethylene glycol  17 g Per Tube Daily   revefenacin  175 mcg Nebulization Daily   tamsulosin  0.4 mg Oral Daily   thiamine  100 mg Per Tube Daily   Continuous Infusions:  ampicillin-sulbactam (UNASYN) IV 3 g (10/31/23 1304)   feeding supplement (OSMOLITE 1.5 CAL) 45 mL/hr at 10/30/23 1223   PRN Meds:.acetaminophen (TYLENOL) oral liquid 160 mg/5 mL **OR** acetaminophen, dextrose, fentaNYL (SUBLIMAZE) injection, melatonin, ondansetron (ZOFRAN) IV, mouth rinse, mouth rinse, phenol   I have personally reviewed following labs and imaging studies  LABORATORY DATA: CBC: Recent Labs  Lab 10/25/23 1707 10/25/23 1720 10/26/23 0444 10/26/23 0602 10/26/23 0719 10/28/23 0723 10/29/23 0225 10/30/23 0508 10/31/23 0233  WBC 20.7*  --  20.2*  --   --  16.3* 19.0* 15.0* 16.3*  NEUTROABS 17.3*  --  15.8*  --   --   --   --   --   --   HGB 17.1*   < > 17.2*   < > 15.6 10.6* 11.9* 13.5 13.2  HCT 52.6*   < > 51.7   < > 46.0 32.0* 35.7* 41.3 41.7  MCV 94.1  --  93.0  --   --  93.0 92.5 93.0 96.1  PLT 235  --  209  --   --  87* 111* 152 145*   < > = values in this interval not displayed.    Basic Metabolic Panel: Recent Labs  Lab 10/28/23 0242 10/28/23 0723 10/28/23 0729 10/29/23 0225 10/29/23 1139 10/29/23 1644 10/30/23 0508 10/30/23 1845 10/31/23 0233 10/31/23 0823  NA 147* 146*  --  150*  --   --  150*  --  152*  --   K 3.2* 4.1  --  3.3*  --   --  3.0*  --  5.1  --   CL 117* 115*  --  115*  --   --  120*  --  121*  --   CO2 20* 24  --  17*  --   --  20*  --  14*  --   GLUCOSE 187* 175*  --  237*  --   --  365*  --  325*  --    BUN 38* 37*  --  30*  --   --  40*  --  37*  --   CREATININE 1.67* 1.82*  --  1.83*  --   --  1.78*  --  1.64*  --   CALCIUM 8.5* 8.6*  --  8.7*  --   --  9.1  --  9.4  --   MG  --  2.0  --   --  2.3 2.2 2.3 2.1  --   --   PHOS  --   --    < > 3.5 2.3* 3.5 1.3* 2.2*  --  1.5*   < > = values in this interval not displayed.    GFR: Estimated Creatinine Clearance: 37.2 mL/min (A) (by C-G formula based on SCr of 1.64 mg/dL (H)).  Liver Function Tests: Recent Labs  Lab 10/26/23 0444 10/30/23 0508  AST 30 33  ALT 20 36  ALKPHOS 173* 128*  BILITOT 0.5 1.1  PROT 8.2* 6.9  ALBUMIN 3.6 2.6*   No results for input(s): "LIPASE", "AMYLASE" in the last 168 hours. No results for input(s): "AMMONIA" in the last 168 hours.  Coagulation Profile: Recent Labs  Lab 10/26/23 0632  INR 1.1    Cardiac Enzymes: Recent Labs  Lab 10/26/23 1112  CKTOTAL 79    BNP (last 3 results) No results for input(s): "PROBNP" in the last 8760 hours.  Lipid Profile: No results for input(s): "CHOL", "HDL", "LDLCALC", "TRIG", "CHOLHDL", "LDLDIRECT" in the last 72 hours.   Thyroid Function Tests: No results for input(s): "TSH", "T4TOTAL", "FREET4", "T3FREE", "THYROIDAB" in the last 72 hours.  Anemia Panel: No results for input(s): "VITAMINB12", "FOLATE", "FERRITIN", "TIBC", "IRON", "RETICCTPCT" in the last 72 hours.  Urine analysis:    Component Value Date/Time   COLORURINE YELLOW 10/25/2023 1714   APPEARANCEUR CLOUDY (A) 10/25/2023 1714   APPEARANCEUR Clear 03/05/2014 1246   LABSPEC 1.025 10/25/2023 1714   LABSPEC 1.004 03/05/2014 1246   PHURINE 5.0 10/25/2023 1714   GLUCOSEU >=500 (A) 10/25/2023 1714   GLUCOSEU Negative 03/05/2014 1246   HGBUR SMALL (A) 10/25/2023 1714   HGBUR negative 11/30/2010 1511   BILIRUBINUR NEGATIVE 10/25/2023 1714   BILIRUBINUR Negative 03/05/2014 1246   KETONESUR NEGATIVE 10/25/2023 1714   PROTEINUR NEGATIVE 10/25/2023 1714   UROBILINOGEN 1.0 11/30/2010 1511    NITRITE NEGATIVE 10/25/2023 1714   LEUKOCYTESUR LARGE (A) 10/25/2023 1714   LEUKOCYTESUR 2+ 03/05/2014 1246    Sepsis Labs: Lactic Acid, Venous    Component Value Date/Time   LATICACIDVEN 2.4 (HH) 10/26/2023 1112    MICROBIOLOGY: Recent Results (from the past 240 hours)  Culture, blood (routine x 2)     Status: None   Collection Time: 10/25/23  5:07 PM   Specimen: BLOOD  Result Value Ref Range Status   Specimen Description BLOOD SITE NOT SPECIFIED  Final   Special Requests   Final    BOTTLES DRAWN AEROBIC AND ANAEROBIC Blood Culture results may not be optimal due to an inadequate volume of blood received in culture bottles   Culture   Final    NO GROWTH 5 DAYS Performed at [facility], [address] [place], [place] [address]    Report Status 10/30/2023 FINAL  Final  Culture, blood (routine x 2)     Status: None   Collection Time: 10/25/23  5:33 PM   Specimen: BLOOD  Result Value Ref Range Status   Specimen Description BLOOD LEFT ANTECUBITAL  Final   Special Requests   Final  BOTTLES DRAWN AEROBIC AND ANAEROBIC Blood Culture results may not be optimal due to an inadequate volume of blood received in culture bottles   Culture   Final    NO GROWTH 5 DAYS Performed at [facility], [address], [place], [place] [address]    Report Status 10/30/2023 FINAL  Final  Culture, OB Urine     Status: Abnormal   Collection Time: 10/25/23 10:57 PM   Specimen: Urine, Random  Result Value Ref Range Status   Specimen Description URINE, RANDOM  Final   Special Requests   Final    NONE Performed at [facility], [address] [place], [place] [address]    Culture MULTIPLE SPECIES PRESENT, SUGGEST RECOLLECTION (A)  Final   Report Status 10/27/2023 FINAL  Final  Culture, Respiratory w Gram Stain     Status: None   Collection Time: 10/26/23  3:31 AM   Specimen: Tracheal Aspirate; Respiratory  Result Value Ref Range Status   Specimen Description TRACHEAL  ASPIRATE  Final   Special Requests NONE  Final   Gram Stain   Final    FEW WBC PRESENT, PREDOMINANTLY PMN FEW GRAM POSITIVE RODS FEW BUDDING YEAST SEEN RARE GRAM NEGATIVE RODS RARE GRAM POSITIVE COCCI    Culture   Final    FEW Normal respiratory flora-no Staph aureus or Pseudomonas seen Performed at [facility], [address], [place], [place] [address]    Report Status 10/28/2023 FINAL  Final  MRSA Next Gen by PCR, Nasal     Status: None   Collection Time: 10/26/23  3:50 AM   Specimen: Nasal Mucosa; Nasal Swab  Result Value Ref Range Status   MRSA by PCR Next Gen NOT DETECTED NOT DETECTED Final    Comment: (NOTE) The GeneXpert MRSA Assay (FDA approved for NASAL specimens only), is one component of a comprehensive MRSA colonization surveillance program. It is not intended to diagnose MRSA infection nor to guide or monitor treatment for MRSA infections. Test performance is not FDA approved in patients less than 39 years old. Performed at [facility], [address] [place], [place] [address]     RADIOLOGY STUDIES/RESULTS: DG Chest Port 1 View Result Date: 10/30/2023 CLINICAL DATA:  Short of breath, COPD EXAM: PORTABLE CHEST 1 VIEW COMPARISON:  10/27/2023 FINDINGS: Single frontal view of the chest demonstrates interval removal of the endotracheal tube. Enteric catheter passes below diaphragm tip excluded by collimation. Cardiac silhouette is stable. Continued enlargement of the aortic knob. Scattered bilateral airspace disease again noted, greatest at the left lung base, without appreciable change since prior study. No effusion or pneumothorax. No acute bony abnormalities. IMPRESSION: 1. Scattered bilateral airspace disease, greatest at the left lung base, compatible with pneumonia. 2. Stable enlargement of the aortic knob consistent with known history of thoracic aortic aneurysm. Electronically Signed   By: Sharlet Salina M.D.   On: 10/30/2023 09:56     LOS: 6 days    Jeoffrey Massed, MD  [facility]    To contact the attending provider between 7A-7P or the covering provider during after hours 7P-7A, please log into the web site [email or web address] and access using universal [place] password for that web site. If you do not have the password, please call the hospital operator.  10/31/2023, 2:26 PM

## 2023-10-31 NOTE — TOC Transition Note (Addendum)
Transition of Care [facility]) - Progression Note   Patient Details  Name: [name] MRN: [id number] Date of Birth: 09-21-1956  Transition of Care [facility]) CM/SW Contact:  Gordy Clement, RN Phone Number: 10/31/2023, 11:41 AM   Clinical Narrative:     RNCM received call from [facility] Nurse [place] [phone]) Friday only and [phone]; Jeanette Caprice has spoken with contact, [name]- The following DME is going to be ordered by hospice and delivered to home : Hosapital bed,OTB table, BSC,O2 prn,nebulizer and suction .   [facility] will contact Case Management when DME is delivered and patient transport can be arranged. PTAR to transport   Med Nec form with Saturday's Date is printed along with Face Sheet in chart     UPDATE:  RNCM received secure chat that family has chosen [employer] - Mother in [place] is receiving services in the home from [facility]. RNCM called with referral to Selena Batten at [facility] [phone]  She said that [facility] will now call Family and get DME needed. They will let [facility] know when home Hospice is arranged and patient can DC. W/E CM number was also shared for [facility] to contact   TOC will continue to follow patient for any additional discharge needs   Liaison for [facility] made aware        RNCM contacted by [facility] stating a referral had been made to them by HF RNCM  for services for patient. Liaison Eber Jones [phone]) has spoken with Family and they are agreeable to services with [facility]. TOC will continue to follow patient for any additional discharge needs.        Barriers to Discharge: Continued Medical Work up   Patient Goals and CMS Choice   CMS Medicare.gov Compare Post Acute Care list provided to:: Patient Represenative (must comment) Choice offered to / list presented to : [facility] POA / Guardian      Discharge Placement                       Discharge Plan and Services Additional resources added to the  After Visit Summary for     Discharge Planning Services: CM Consult Post Acute Care Choice: Home Health          DME Arranged: 3-N-1 DME Agency: [employer]       HH Arranged: RN, PT [facility] Agency:  [facility]) Date [facility] Agency Contacted: 10/28/23 Time HH Agency Contacted: 1625 Representative spoke with at [facility] Agency: Rhunette Croft  Social Drivers of Health (SDOH) Interventions SDOH Screenings   Food Insecurity: Patient Unable To Answer (10/26/2023)  Housing: Patient Unable To Answer (10/26/2023)  Transportation Needs: Patient Unable To Answer (10/26/2023)  Utilities: Patient Unable To Answer (10/26/2023)  Financial Resource Strain: High Risk (08/30/2022)   Received from [facility], [facility]  Tobacco Use: High Risk (10/25/2023)     Readmission Risk Interventions     No data to display

## 2023-10-31 NOTE — Progress Notes (Signed)
Daily Progress Note   Patient Name: [name]       Date: 10/31/2023 DOB: 02/10/1956  Age: 67 y.o. MRN#: [id number] Attending Physician: Maretta Bees, MD Primary Care Physician: [facility] Admit Date: 10/25/2023 Length of Stay: 6 days  Reason for Consultation/Follow-up: Establishing goals of care  HPI/Patient Profile:  67 y.o. male   admitted on 10/25/2023 with   past medical history of insulin-dependent type 2 diabetes mellitus, hypertension, HLD,  he is  frail and cachectic who presented to [facility] on 12/14 for altered mental status. Hospital  course complicated by hypoxic respiratory failure requiring  intubation. Successfully extubated but remains high risk for decompensation.  Palliative medicine was consulted for GOC conversations.   Subjective:   Subjective: Chart Reviewed. Updates received. Patient Assessed. Created space and opportunity for patient  and family to explore thoughts and feelings regarding current medical situation.  Today's Discussion: Today I initially attempted to see the patient in the morning but he was sleeping and was difficult to wake despite verbal and tactile stimuli.  At that time I did speak with the nurse who states that the patient was sleeping except getting up to use the bathroom last night, although he was quite unsteady on his feet.  Today he has been mostly sleeping, using the urinal if needed, has had bowel movements in the bed.  He notes that he is significantly more confused today than he was previously.    Later in the day I returned to see the patient at the bedside, no family was present.  He remains sleeping and difficult to arouse.  Nurse told me that the patient's ex-wife [name] who was the individual the patient designated to be his decision-maker if he could not had called the floor and requested callback.  I called [name] and gave her an update today.  I shared my observations about the patient being  more sleepy/difficult to arouse today.  She states that she is not surprised and notes that he has been struggling for some time.  We had further conversation on goals of care including options.  She is familiar with [facility] as they are currently in the home caring for her mother-in-law.  At the end of our conversation the patient's wife elected to engage with [facility] for the patient.  I shared that we would work on getting this referral out.  I spoke with TOC and hospitalist Dr. Jerral Ralph.  Modified barium swallow was completed today and noted increased risk for aspiration.  Dr. Jerral Ralph spoke with [name] as well.  Final plan was to continue current scope of treatment, no escalation while here.  Engaged with [facility] for home hospice care.  In the meantime allow the patient to eat as [name] is accepting of risks of aspiration.  This is a comfort measure.  I provided emotional and general support through therapeutic listening, empathy, sharing of stories, and other techniques. I answered all questions and addressed all concerns to the best of my ability.  Review of Systems  Unable to perform ROS   Objective:   Vital Signs:  BP 131/88 (BP Location: Left Arm)   Pulse (!) 105   Temp 97.6 F (36.4 C) (Axillary)   Resp 20   Ht 5\' 11"  (1.803 m)   Wt 58.5 kg   SpO2 91%   BMI 17.99 kg/m   Physical Exam Vitals and nursing note reviewed.  Constitutional:      General:  He is sleeping. He is not in acute distress.    Comments: Difficult to arouse  HENT:     Head: Normocephalic and atraumatic.  Cardiovascular:     Rate and Rhythm: Normal rate.  Pulmonary:     Effort: Pulmonary effort is normal. No respiratory distress.  Abdominal:     General: Abdomen is flat.  Skin:    General: Skin is warm and dry.     Palliative Assessment/Data: 10-20%    Existing Vynca/ACP Documentation: None  Assessment & Plan:   Impression: Present on Admission:   Hyperglycemia  Acute metabolic encephalopathy  AKI (acute kidney injury) (HCC)  Severe sepsis (HCC)  Acute cystitis  Dehydration  Acute prerenal azotemia  Elevated troponin  Essential hypertension  SUMMARY OF RECOMMENDATIONS   Remain DNR-limited Treat the treatable, continue current scope of care while admitted No escalation of care Allow patient to eat with full acceptance of risks as comfort measure TOC consult for referral to [facility] Palliative medicine will continue to follow  Symptom Management:  Per primary team PMT is available to assist as needed  Code Status: DNR-limited  Prognosis: < 4 weeks  Discharge Planning: Home with Hospice  Discussed with: Patient's family, medical team, nursing team, [facility] team  Thank you for allowing [place] to participate in the care of [name] PMT will continue to support holistically.  Time Total: 60 min  Detailed review of medical records (labs, imaging, vital signs), medically appropriate exam, discussed with treatment team, counseling and education to patient, family, & staff, documenting clinical information, medication management, coordination of care  Wynne Dust, NP Palliative Medicine Team  Team Phone # [phone] (Nights/Weekends)  07/10/2021, 8:17 AM

## 2023-11-01 DIAGNOSIS — E1100 Type 2 diabetes mellitus with hyperosmolarity without nonketotic hyperglycemic-hyperosmolar coma (NKHHC): Secondary | ICD-10-CM | POA: Diagnosis not present

## 2023-11-01 DIAGNOSIS — A419 Sepsis, unspecified organism: Secondary | ICD-10-CM | POA: Diagnosis not present

## 2023-11-01 DIAGNOSIS — Z515 Encounter for palliative care: Secondary | ICD-10-CM | POA: Diagnosis not present

## 2023-11-01 DIAGNOSIS — Z7189 Other specified counseling: Secondary | ICD-10-CM | POA: Diagnosis not present

## 2023-11-01 DIAGNOSIS — Z66 Do not resuscitate: Secondary | ICD-10-CM | POA: Diagnosis not present

## 2023-11-01 DIAGNOSIS — R4182 Altered mental status, unspecified: Secondary | ICD-10-CM | POA: Diagnosis not present

## 2023-11-01 LAB — BASIC METABOLIC PANEL
Anion gap: 13 (ref 5–15)
BUN: 29 mg/dL — ABNORMAL HIGH (ref 8–23)
CO2: 20 mmol/L — ABNORMAL LOW (ref 22–32)
Calcium: 8.2 mg/dL — ABNORMAL LOW (ref 8.9–10.3)
Chloride: 115 mmol/L — ABNORMAL HIGH (ref 98–111)
Creatinine, Ser: 1.6 mg/dL — ABNORMAL HIGH (ref 0.61–1.24)
GFR, Estimated: 48 mL/min — ABNORMAL LOW (ref >60)
Glucose, Bld: 273 mg/dL — ABNORMAL HIGH (ref 70–99)
Potassium: 3.4 mmol/L — ABNORMAL LOW (ref 3.5–5.1)
Sodium: 148 mmol/L — ABNORMAL HIGH (ref 135–145)

## 2023-11-01 LAB — GLUCOSE, CAPILLARY
Glucose-Capillary: 222 mg/dL — ABNORMAL HIGH (ref 70–99)
Glucose-Capillary: 237 mg/dL — ABNORMAL HIGH (ref 70–99)
Glucose-Capillary: 257 mg/dL — ABNORMAL HIGH (ref 70–99)
Glucose-Capillary: 263 mg/dL — ABNORMAL HIGH (ref 70–99)
Glucose-Capillary: 279 mg/dL — ABNORMAL HIGH (ref 70–99)
Glucose-Capillary: 282 mg/dL — ABNORMAL HIGH (ref 70–99)
Glucose-Capillary: 484 mg/dL — ABNORMAL HIGH (ref 70–99)

## 2023-11-01 LAB — PHOSPHORUS: Phosphorus: 4.4 mg/dL (ref 2.5–4.6)

## 2023-11-01 LAB — MAGNESIUM: Magnesium: 1.6 mg/dL — ABNORMAL LOW (ref 1.7–2.4)

## 2023-11-01 MED ORDER — INSULIN GLARGINE-YFGN 100 UNIT/ML ~~LOC~~ SOLN
24.0000 [IU] | Freq: Every day | SUBCUTANEOUS | Status: DC
  Administered 2023-11-02: 24 [IU] via SUBCUTANEOUS
  Filled 2023-11-01: qty 0.24

## 2023-11-01 MED ORDER — MAGNESIUM SULFATE 4 GM/100ML IV SOLN
4.0000 g | Freq: Once | INTRAVENOUS | Status: AC
  Administered 2023-11-01: 4 g via INTRAVENOUS
  Filled 2023-11-01: qty 100

## 2023-11-01 MED ORDER — POLYETHYLENE GLYCOL 3350 17 G PO PACK: 17.0000 g | PACK | Freq: Every day | ORAL | 0 refills | Status: AC

## 2023-11-01 MED ORDER — POTASSIUM CHLORIDE 20 MEQ PO PACK
40.0000 meq | PACK | Freq: Once | ORAL | Status: AC
  Administered 2023-11-01: 40 meq
  Filled 2023-11-01: qty 2

## 2023-11-01 MED ORDER — MORPHINE SULFATE (CONCENTRATE) 20 MG/ML PO SOLN: 5.0000 mg | ORAL | 0 refills | Status: AC | PRN

## 2023-11-01 MED ORDER — DOCUSATE SODIUM 50 MG/5ML PO LIQD: 100.0000 mg | Freq: Two times a day (BID) | ORAL | 0 refills | Status: AC

## 2023-11-01 MED ORDER — LORAZEPAM 2 MG/ML PO CONC: 1.0000 mg | ORAL | 0 refills | Status: AC | PRN

## 2023-11-01 MED ORDER — INSULIN ASPART 100 UNIT/ML IJ SOLN
25.0000 [IU] | Freq: Once | INTRAMUSCULAR | Status: AC
  Administered 2023-11-01: 25 [IU] via SUBCUTANEOUS

## 2023-11-01 NOTE — Progress Notes (Signed)
PROGRESS NOTE        PATIENT DETAILS Name: [name] Age: 67 y.o. Sex: male Date of Birth: [birth year] Admit Date: 10/25/2023 Admitting Physician Angie Fava, DO GEX:BMWUXL, [facility]  Brief Summary: Patient is a 67 y.o.  male with history of DM-2, HTN, HLD, frail/cachectic at baseline-presented with altered mental status-found to have HHS-septic shock secondary to PNA/UTI-intubated-and admitted to the ICU.  Briefly required vasopressors in the ICU.  Extubated on 12/17-and transferred to [facility].    Significant events: 12/14>> to ED-altered mental status-admit to [facility]-HHS-started on insulin drip-severe sepsis due to UTI. 12/15>> worsening shortness of breath- intubated-admit to ICU-bronchoscopy-extensive white thick secretions. 12/16>> off pressors 12/17>> extubated 12/18>> transferred to [facility]  Significant studies: 11/19>> CTA chest: Stable aneurysmal disease of distal aortic arch and proximal descending thoracic aorta-5 cm in maximal diameter. 12/14>> CXR: No acute changes.-Massively dilated aortic arch. 12/16>> echo: EF 55-60%.  Significant microbiology data: 12/14>> blood culture: No growth 12/14>> urine culture: Multiple species 12/15>> tracheal aspirate: Normal respiratory flora  Procedures: 12/15-12/17>> ETT 12/15>> bronchoscopy: Extensive white secretions 12/18>>Cortrak tube  Consults: PCCM  Subjective: Patient is unresponsive.  Objective: Vitals: Blood pressure (!) 141/85, pulse 90, temperature 98.3 F (36.8 C), temperature source Oral, resp. rate 20, height 5\' 11"  (1.803 m), weight 58.5 kg, SpO2 100%.   Exam:  General appearance: Poorly responsive Resp: Clear to auscultation bilaterally.  Normal effort Cardio: S1-S2 is normal regular.  No S3-S4.  No rubs murmurs or bruit GI: Abdomen is soft.  Nontender nondistended.  Bowel sounds are present normal.  No masses organomegaly   Pertinent Labs/Radiology:    Latest Ref  Rng & Units 10/31/2023    2:33 AM 10/30/2023    5:08 AM 10/29/2023    2:25 AM  CBC  WBC 4.0 - 10.5 K/uL 16.3  15.0  19.0   Hemoglobin 13.0 - 17.0 g/dL 24.4  01.0  27.2   Hematocrit 39.0 - 52.0 % 41.7  41.3  35.7   Platelets 150 - 400 K/uL 145  152  111     Lab Results  Component Value Date   NA 148 (H) 11/01/2023   K 3.4 (L) 11/01/2023   CL 115 (H) 11/01/2023   CO2 20 (L) 11/01/2023      Assessment/Plan: Acute hypoxic respiratory failure secondary to PNA Intubated on admission-suspicion for aspiration Overall improved with IV antibiotics-now on room air. Remains on Unasyn-7 total days planned  Septic shock secondary to PNA/complicated UTI Required pressors in the ICU Sepsis physiology has resolved On empiric antibiotics as above  Acute metabolic encephalopathy Secondary to HHS/septic shock Was better yesterday but noted to be unresponsive today.  AKI Suspect hemodynamically mediated Slowly improving Avoid nephrotoxic agents  Hypernatremia Secondary to free water deficit-n.p.o.-secondary to oropharyngeal dysphagia Getting free water through feeding tube.  Sodium levels better today.  Hypokalemia/hypomagnesemia Will be supplemented  Hypophosphatemia Improved  HTN BP stable Continue Coreg  Aortic aneurysm Recent CTA prior to this hospitalization with significant dilatation of the distal aortic arch Beta-blocker Currently not in any shape for vascular surgery evaluation-suspect this will need to be pursued in the outpatient setting-as this is a chronic issue  HLD Statin  HHS DM-2 (A1c 14.2 on 12/14) with uncontrolled hyperglycemia CBGs on the higher side Increase Semglee to 18 units Start scheduled Premeal NovoLog-3 units  Change SSI to  resistant scale Follow closely.  Recent Labs    11/01/23 0132 11/01/23 0441 11/01/23 0829  GLUCAP 279* 222* 282*     COPD Stable Bronchodilators  GERD Pepcid  Oropharyngeal dysphagia Likely due to severe  debility/deconditioning Cortrak tube placed 12/18 Underwent modified barium swallow on 12/20.  Remains at high risk for aspiration.  Debility/deconditioning PT/OT eval-SNF contemplated-however family looking to take him home with hospice  Palliative care DNR in place. Palliative care following.  Plan is for transition to hospice care at home.  Nutrition Status: Nutrition Problem: Severe Malnutrition Etiology: chronic illness Signs/Symptoms: severe muscle depletion, severe fat depletion Interventions: Refer to RD note for recommendations  Code status:   Code Status: Limited: Do not attempt resuscitation (DNR) -DNR-LIMITED -Do Not Intubate/DNI    DVT Prophylaxis: heparin injection 5,000 Units Start: 10/26/23 1400 SCDs Start: 10/25/23 2232   Family Communication: Ex Wife [name] Nelson-[phone]-called on 12/19-unable to leave voicemail-mailbox full.   Disposition Plan: Status is: Inpatient Remains inpatient appropriate because: Severe of illness   Planned Discharge Destination:Skilled nursing facility   Diet: Diet Order             Diet regular Room service appropriate? Yes with Assist; Fluid consistency: Thin  Diet effective now                     Antimicrobial agents: Anti-infectives (From admission, onward)    Start     Dose/Rate Route Frequency Ordered Stop   10/29/23 2000  Ampicillin-Sulbactam (UNASYN) 3 g in sodium chloride 0.9 % 100 mL IVPB        3 g 200 mL/hr over 30 Minutes Intravenous Every 6 hours 10/29/23 1422 11/02/23 0759   10/28/23 0500  vancomycin (VANCOREADY) IVPB 750 mg/150 mL  Status:  Discontinued        750 mg 150 mL/hr over 60 Minutes Intravenous Every 48 hours 10/26/23 0406 10/26/23 0810   10/26/23 2200  cefTRIAXone (ROCEPHIN) 2 g in sodium chloride 0.9 % 100 mL IVPB  Status:  Discontinued        2 g 200 mL/hr over 30 Minutes Intravenous Every 24 hours 10/26/23 0010 10/26/23 0334   10/26/23 1200  piperacillin-tazobactam (ZOSYN)  IVPB 3.375 g  Status:  Discontinued        3.375 g 12.5 mL/hr over 240 Minutes Intravenous Every 8 hours 10/26/23 0810 10/29/23 1422   10/26/23 0900  azithromycin (ZITHROMAX) 500 mg in sodium chloride 0.9 % 250 mL IVPB        500 mg 250 mL/hr over 60 Minutes Intravenous Every 24 hours 10/26/23 0807 10/28/23 0925   10/26/23 0445  piperacillin-tazobactam (ZOSYN) IVPB 2.25 g  Status:  Discontinued        2.25 g 100 mL/hr over 30 Minutes Intravenous Every 8 hours 10/26/23 0351 10/26/23 0810   10/26/23 0445  vancomycin (VANCOREADY) IVPB 1250 mg/250 mL        1,250 mg 166.7 mL/hr over 90 Minutes Intravenous  Once 10/26/23 0351 10/26/23 0607   10/25/23 2200  cefTRIAXone (ROCEPHIN) 2 g in sodium chloride 0.9 % 100 mL IVPB  Status:  Discontinued        2 g 200 mL/hr over 30 Minutes Intravenous Every 24 hours 10/25/23 2154 10/26/23 0009        MEDICATIONS: Scheduled Meds:  arformoterol  15 mcg Nebulization BID   atorvastatin  20 mg Per Tube Daily   carvedilol  6.25 mg Per Tube Daily   Chlorhexidine Gluconate Cloth  6  each Topical Daily   docusate  100 mg Per Tube BID   famotidine  20 mg Per Tube QHS   feeding supplement (PROSource TF20)  60 mL Per Tube Daily   folic acid  1 mg Per Tube Daily   free water  500 mL Per Tube Q4H   guaiFENesin  10 mL Per Tube BID   heparin injection (subcutaneous)  5,000 Units Subcutaneous Q8H   insulin aspart  0-20 Units Subcutaneous Q4H   insulin aspart  3 Units Subcutaneous TID WC   insulin glargine-yfgn  18 Units Subcutaneous Daily   multivitamin with minerals  1 tablet Per Tube Daily   nicotine  14 mg Transdermal Daily   mouth rinse  15 mL Mouth Rinse 4 times per day   polyethylene glycol  17 g Per Tube Daily   revefenacin  175 mcg Nebulization Daily   tamsulosin  0.4 mg Oral Daily   thiamine  100 mg Per Tube Daily   Continuous Infusions:  ampicillin-sulbactam (UNASYN) IV 3 g (11/01/23 0958)   feeding supplement (OSMOLITE 1.5 CAL) 45 mL/hr at  10/30/23 1223   magnesium sulfate bolus IVPB 4 g (11/01/23 1001)   PRN Meds:.acetaminophen (TYLENOL) oral liquid 160 mg/5 mL **OR** acetaminophen, dextrose, fentaNYL (SUBLIMAZE) injection, melatonin, ondansetron (ZOFRAN) IV, mouth rinse, mouth rinse, phenol   I have personally reviewed following labs and imaging studies  LABORATORY DATA: CBC: Recent Labs  Lab 10/25/23 1707 10/25/23 1720 10/26/23 0444 10/26/23 0602 10/26/23 0719 10/28/23 0723 10/29/23 0225 10/30/23 0508 10/31/23 0233  WBC 20.7*  --  20.2*  --   --  16.3* 19.0* 15.0* 16.3*  NEUTROABS 17.3*  --  15.8*  --   --   --   --   --   --   HGB 17.1*   < > 17.2*   < > 15.6 10.6* 11.9* 13.5 13.2  HCT 52.6*   < > 51.7   < > 46.0 32.0* 35.7* 41.3 41.7  MCV 94.1  --  93.0  --   --  93.0 92.5 93.0 96.1  PLT 235  --  209  --   --  87* 111* 152 145*   < > = values in this interval not displayed.    Basic Metabolic Panel: Recent Labs  Lab 10/28/23 0723 10/28/23 0729 10/29/23 0225 10/29/23 1139 10/29/23 1644 10/30/23 0508 10/30/23 1845 10/31/23 0233 10/31/23 0823 11/01/23 0507  NA 146*  --  150*  --   --  150*  --  152*  --  148*  K 4.1  --  3.3*  --   --  3.0*  --  5.1  --  3.4*  CL 115*  --  115*  --   --  120*  --  121*  --  115*  CO2 24  --  17*  --   --  20*  --  14*  --  20*  GLUCOSE 175*  --  237*  --   --  365*  --  325*  --  273*  BUN 37*  --  30*  --   --  40*  --  37*  --  29*  CREATININE 1.82*  --  1.83*  --   --  1.78*  --  1.64*  --  1.60*  CALCIUM 8.6*  --  8.7*  --   --  9.1  --  9.4  --  8.2*  MG 2.0  --   --  2.3 2.2 2.3 2.1  --   --  1.6*  PHOS  --    < > 3.5 2.3* 3.5 1.3* 2.2*  --  1.5* 4.4   < > = values in this interval not displayed.    GFR: Estimated Creatinine Clearance: 38.1 mL/min (A) (by C-G formula based on SCr of 1.6 mg/dL (H)).  Liver Function Tests: Recent Labs  Lab 10/26/23 0444 10/30/23 0508  AST 30 33  ALT 20 36  ALKPHOS 173* 128*  BILITOT 0.5 1.1  PROT 8.2* 6.9   ALBUMIN 3.6 2.6*     Coagulation Profile: Recent Labs  Lab 10/26/23 0632  INR 1.1    Cardiac Enzymes: Recent Labs  Lab 10/26/23 1112  CKTOTAL 79     Urine analysis:    Component Value Date/Time   COLORURINE YELLOW 10/25/2023 1714   APPEARANCEUR CLOUDY (A) 10/25/2023 1714   APPEARANCEUR Clear 03/05/2014 1246   LABSPEC 1.025 10/25/2023 1714   LABSPEC 1.004 03/05/2014 1246   PHURINE 5.0 10/25/2023 1714   GLUCOSEU >=500 (A) 10/25/2023 1714   GLUCOSEU Negative 03/05/2014 1246   HGBUR SMALL (A) 10/25/2023 1714   HGBUR negative 11/30/2010 1511   BILIRUBINUR NEGATIVE 10/25/2023 1714   BILIRUBINUR Negative 03/05/2014 1246   KETONESUR NEGATIVE 10/25/2023 1714   PROTEINUR NEGATIVE 10/25/2023 1714   UROBILINOGEN 1.0 11/30/2010 1511   NITRITE NEGATIVE 10/25/2023 1714   LEUKOCYTESUR LARGE (A) 10/25/2023 1714   LEUKOCYTESUR 2+ 03/05/2014 1246    Sepsis Labs: Lactic Acid, Venous    Component Value Date/Time   LATICACIDVEN 2.4 (HH) 10/26/2023 1112    MICROBIOLOGY: Recent Results (from the past 240 hours)  Culture, blood (routine x 2)     Status: None   Collection Time: 10/25/23  5:07 PM   Specimen: BLOOD  Result Value Ref Range Status   Specimen Description BLOOD SITE NOT SPECIFIED  Final   Special Requests   Final    BOTTLES DRAWN AEROBIC AND ANAEROBIC Blood Culture results may not be optimal due to an inadequate volume of blood received in culture bottles   Culture   Final    NO GROWTH 5 DAYS Performed at [facility], [address], [place], [place] [address]    Report Status 10/30/2023 FINAL  Final  Culture, blood (routine x 2)     Status: None   Collection Time: 10/25/23  5:33 PM   Specimen: BLOOD  Result Value Ref Range Status   Specimen Description BLOOD LEFT ANTECUBITAL  Final   Special Requests   Final    BOTTLES DRAWN AEROBIC AND ANAEROBIC Blood Culture results may not be optimal due to an inadequate volume of blood received in culture bottles    Culture   Final    NO GROWTH 5 DAYS Performed at [facility], [address], [place], [place] [address]    Report Status 10/30/2023 FINAL  Final  Culture, OB Urine     Status: Abnormal   Collection Time: 10/25/23 10:57 PM   Specimen: Urine, Random  Result Value Ref Range Status   Specimen Description URINE, RANDOM  Final   Special Requests   Final    NONE Performed at [facility], [address], [place], [place] [address]    Culture MULTIPLE SPECIES PRESENT, SUGGEST RECOLLECTION (A)  Final   Report Status 10/27/2023 FINAL  Final  Culture, Respiratory w Gram Stain     Status: None   Collection Time: 10/26/23  3:31 AM  Specimen: Tracheal Aspirate; Respiratory  Result Value Ref Range Status   Specimen Description TRACHEAL ASPIRATE  Final   Special Requests NONE  Final   Gram Stain   Final    FEW WBC PRESENT, PREDOMINANTLY PMN FEW GRAM POSITIVE RODS FEW BUDDING YEAST SEEN RARE GRAM NEGATIVE RODS RARE GRAM POSITIVE COCCI    Culture   Final    FEW Normal respiratory flora-no Staph aureus or Pseudomonas seen Performed at [facility] Lab, [address], [place], [place] [address]    Report Status 10/28/2023 FINAL  Final  MRSA Next Gen by PCR, Nasal     Status: None   Collection Time: 10/26/23  3:50 AM   Specimen: Nasal Mucosa; Nasal Swab  Result Value Ref Range Status   MRSA by PCR Next Gen NOT DETECTED NOT DETECTED Final    Comment: (NOTE) The GeneXpert MRSA Assay (FDA approved for NASAL specimens only), is one component of a comprehensive MRSA colonization surveillance program. It is not intended to diagnose MRSA infection nor to guide or monitor treatment for MRSA infections. Test performance is not FDA approved in patients less than 20 years old. Performed at [facility] Lab, [address], [place], [place] [address]     RADIOLOGY STUDIES/RESULTS: DG Swallowing Func-Speech Pathology Result Date: 10/31/2023 Table formatting from the original  result was not included. Modified Barium Swallow Study Patient Details Name: [name] MRN: [id number] Date of Birth: Nov 19, 1955 Today's Date: 10/31/2023 HPI/PMH: HPI: [name] is a 67 yo male presenting to ED 12/14 with AMS. Admitted with septic shock due to acute UTI and acute respiratory failure in the setting of PNA (aspiration v community acquired). ETT 12/15-12/17. PMH includes polysubstance abuse, T2DM, HTN, HLD Clinical Impression: Clinical Impression: Pt continues to present with a severe oropharyngeal dysphagia with little to no improvement since previous MBS now further complicated by pt's participation. He was not responsive to reasoning and did not elicit a cued cough or swallow throughout the entirety of the study. He held boluses in his oral cavity for prolonged amounts of time and refused to expectorate. Pt silently aspirated thin and nectar thick liquids. The majority of each bolus resides in pt's valleculae and pyriform sinuses and pt is unable to participate in use of compensatory strategies to clear. This results in airway invasion after the swallow as well with residuals of all boluses, including purees. Safest diet recommendation would be to continue NPO status. Discussed with MD, who states pt's family is now willing to accept risk of aspiration. SLP will initiate a diet and s/o. Factors that may increase risk of adverse event in presence of aspiration Rubye Oaks & Clearance Coots 2021): Factors that may increase risk of adverse event in presence of aspiration Rubye Oaks & Clearance Coots 2021): Poor general health and/or compromised immunity; Reduced cognitive function; Frail or deconditioned; Inadequate oral hygiene; Weak cough; Aspiration of thick, dense, and/or acidic materials; Frequent aspiration of large volumes Recommendations/Plan: Swallowing Evaluation Recommendations Swallowing Evaluation Recommendations Recommendations: PO diet PO Diet Recommendation: Regular; Thin liquids (Level 0) Liquid  Administration via: Cup; Straw Medication Administration: Crushed with puree Supervision: Patient able to self-feed; Full supervision/cueing for swallowing strategies Swallowing strategies  : Minimize environmental distractions; Slow rate; Small bites/sips Postural changes: Position pt fully upright for meals; Stay upright 30-60 min after meals Oral care recommendations: Oral care QID (4x/day); Oral care before ice chips/water; Use suctioning for oral care Treatment Plan Treatment Plan Treatment recommendations: Therapy as outlined in treatment plan below Follow-up  recommendations: Home health SLP Functional status assessment: Patient has had a recent decline in their functional status and/or demonstrates limited ability to make significant improvements in function in a reasonable and predictable amount of time. Recommendations Recommendations for follow up therapy are one component of a multi-disciplinary discharge planning process, led by the attending physician.  Recommendations may be updated based on patient status, additional functional criteria and insurance authorization. Assessment: Orofacial Exam: Orofacial Exam Oral Cavity: Oral Hygiene: Lingual coating; Dried secretions Oral Cavity - Dentition: Poor condition; Missing dentition Orofacial Anatomy: WFL Oral Motor/Sensory Function: WFL Anatomy: Anatomy: Suspected cervical osteophytes Boluses Administered: Boluses Administered Boluses Administered: Thin liquids (Level 0); Mildly thick liquids (Level 2, nectar thick); Puree  Oral Impairment Domain: Oral Impairment Domain Lip Closure: No labial escape Tongue control during bolus hold: Cohesive bolus between tongue to palatal seal Bolus transport/lingual motion: Delayed initiation of tongue motion (oral holding) Oral residue: Trace residue lining oral structures Location of oral residue : Tongue; Palate; Lateral sulci; Floor of mouth Initiation of pharyngeal swallow : Pyriform sinuses  Pharyngeal Impairment  Domain: Pharyngeal Impairment Domain Soft palate elevation: No bolus between soft palate (SP)/pharyngeal wall (PW) Laryngeal elevation: Partial superior movement of thyroid cartilage/partial approximation of arytenoids to epiglottic petiole Anterior hyoid excursion: Partial anterior movement Epiglottic movement: Partial inversion Laryngeal vestibule closure: Incomplete, narrow column air/contrast in laryngeal vestibule Pharyngeal stripping wave : Present - complete Pharyngeal contraction (A/P view only): N/A Pharyngoesophageal segment opening: Complete distension and complete duration, no obstruction of flow Tongue base retraction: Wide column of contrast or air between tongue base and PPW Pharyngeal residue: Majority of contrast within or on pharyngeal structures Location of pharyngeal residue: Tongue base; Valleculae; Pyriform sinuses  Esophageal Impairment Domain: No data recorded Pill: No data recorded Penetration/Aspiration Scale Score: Penetration/Aspiration Scale Score 5.  Material enters airway, CONTACTS cords and not ejected out: Puree 7.  Material enters airway, passes BELOW cords and not ejected out despite cough attempt by patient: Thin liquids (Level 0) 8.  Material enters airway, passes BELOW cords without attempt by patient to eject out (silent aspiration) : Thin liquids (Level 0); Mildly thick liquids (Level 2, nectar thick) Compensatory Strategies: Compensatory Strategies Compensatory strategies: No   General Information: Caregiver present: No  Diet Prior to this Study: NPO; Cortrak/Small bore NG tube   Temperature : Normal   Respiratory Status: WFL   Supplemental O2: None (Room air)   History of Recent Intubation: Yes  Behavior/Cognition: Alert; Uncooperative; Requires cueing; Agitated Self-Feeding Abilities: Needs assist with self-feeding Baseline vocal quality/speech: Dysphonic Volitional Cough: Unable to elicit Volitional Swallow: Unable to elicit Exam Limitations: Poor participation Goal  Planning: Prognosis for improved oropharyngeal function: Fair Barriers to Reach Goals: Cognitive deficits; Time post onset; Severity of deficits No data recorded Patient/Family Stated Goal: wants water Consulted and agree with results and recommendations: Patient; Advanced practice provider Pain: Pain Assessment Pain Assessment: Faces Faces Pain Scale: 0 Pain Location: R hand Pain Descriptors / Indicators: Aching Pain Intervention(s): Monitored during session End of Session: Start Time:SLP Start Time (ACUTE ONLY): 1334 Stop Time: SLP Stop Time (ACUTE ONLY): 1400 Time Calculation:SLP Time Calculation (min) (ACUTE ONLY): 26 min Charges: SLP Evaluations $ SLP Speech Visit: 1 Visit SLP Evaluations $MBS Swallow: 1 Procedure $Swallowing Treatment: 1 Procedure SLP visit diagnosis: SLP Visit Diagnosis: Dysphagia, oropharyngeal phase (R13.12) Past Medical History: Past Medical History: Diagnosis Date  COPD (chronic obstructive pulmonary disease) (HCC)   Diabetes mellitus without complication (HCC)   Hypertension  Past Surgical History:  No past surgical history on file. Gwynneth Aliment, M.A., CF-SLP Speech Language Pathology, Acute Rehabilitation Services Secure Chat preferred [phone] 10/31/2023, 2:51 PM    LOS: 7 days   Osvaldo Shipper, MD  [facility]    To contact the attending provider between 7A-7P or the covering provider during after hours 7P-7A, please log into the web site [email or web address] and access using universal [place] password for that web site. If you do not have the password, please call the hospital operator.  11/01/2023, 10:34 AM

## 2023-11-01 NOTE — Progress Notes (Signed)
Patient is alert with confusion. Patient tried to sit up on the  bedside  sometimes. PT tolerate well Osmolite 45 ml/ hr, Core tract is patent , intact.  active bowel sound all quadrants. Patient is unable to tolerate thin liquid. urine out put. Patient has lose yellow bowl movements.

## 2023-11-01 NOTE — Progress Notes (Signed)
Pharmacy Antibiotic Note  [name] is a 67 y.o. male admitted on 10/25/2023 with  aspiration pneumonia .  Pharmacy has been consulted for Unasyn dosing.  WBC 16.3, afebrile. Renal function stable. Plan is still to complete 7 total days of abx with Unasyn.  Plan: Continue Unasyn 3g IV Q6H - end date of 12/22 F/u renal function, clinical progression, and cultures  Height: 5\' 11"  (180.3 cm) Weight: 58.5 kg (128 lb 15.5 oz) IBW/kg (Calculated) : 75.3  Temp (24hrs), Avg:98.2 F (36.8 C), Min:98 F (36.7 C), Max:98.3 F (36.8 C)  Recent Labs  Lab 10/25/23 1720 10/25/23 1959 10/25/23 2041 10/26/23 0440 10/26/23 0444 10/26/23 0632 10/26/23 1112 10/26/23 1252 10/28/23 0723 10/29/23 0225 10/30/23 0508 10/31/23 0233 11/01/23 0507  WBC  --   --   --   --  20.2*  --   --   --  16.3* 19.0* 15.0* 16.3*  --   CREATININE 2.90*   < >  --   --  1.92*  --  2.12*   < > 1.82* 1.83* 1.78* 1.64* 1.60*  LATICACIDVEN 3.9*  --  3.6* 3.0*  --  2.4* 2.4*  --   --   --   --   --   --    < > = values in this interval not displayed.    Estimated Creatinine Clearance: 38.1 mL/min (A) (by C-G formula based on SCr of 1.6 mg/dL (H)).    Allergies  Allergen Reactions   Codeine Phosphate     REACTION: unspecified    Antimicrobials this admission: Unasyn 12/18 >> (12/22) Zosyn 12/15 >> 12/18 Azithro 12/15 > 12/17 Vanco 12/15 x1 Ceftriaxone 12/14 x1  Microbiology results: 12/14 BCx: ngtdF 12/15 tracheal aspirate: normal respiratory flora 12/14 ucx: multiple species present 12/15 MRSA swab (-)  Thank you for allowing pharmacy to be a part of this patient's care.  Jenita Seashore 11/01/2023 12:17 PM

## 2023-11-01 NOTE — Progress Notes (Signed)
Daily Progress Note   Patient Name: [name]       Date: 11/01/2023 DOB: 1956-04-24  Age: 67 y.o. MRN#: [id number] Attending Physician: Osvaldo Shipper, MD Primary Care Physician: [facility] Admit Date: 10/25/2023 Length of Stay: 7 days  Reason for Consultation/Follow-up: Establishing goals of care  HPI/Patient Profile:  67 y.o. male admitted on 10/25/2023 with   past medical history of insulin-dependent type 2 diabetes mellitus, hypertension, HLD,  he is  frail and cachectic who presented to [facility] on 12/14 for altered mental status. Hospital  course complicated by hypoxic respiratory failure requiring  intubation. Successfully extubated but remains high risk for decompensation.  Palliative medicine was consulted for GOC conversations.   Subjective:   Subjective: Chart Reviewed. Updates received. Patient Assessed. Created space and opportunity for patient  and family to explore thoughts and feelings regarding current medical situation.  Today's Discussion: Today before seeing the patient I spoke with his nurse. Patient is more awake and oriented, but intermittently confused. Patient is more awake today, ate a couple bites of meatloaf and pear for dinner, coughed extensively during and for about an hour after eating. Discussed with the nurse known and anticipated aspiration risks. Discussed plan for continued scope of care, no escalation, anticipate d/c to home hospice in the coming couple days.  I saw the patient at the bedside. He is more awake, has a wet cough during my visit. He was sat up in the bed by nursing who helped prepare his breakfast tray for him. At patient's request I got him a diet soda which he drank some of, no coughing initially. He notes chest pain when he coughs along with shortness of breath. Does not appear dyspneic at rest. Denies other pain, nausea, vomiting. He mentions hopeful to go home soon.  I provided emotional and general  support through therapeutic listening, empathy, sharing of stories, and other techniques. I answered all questions and addressed all concerns to the best of my ability.  Review of Systems  Respiratory:  Positive for cough, chest tightness (with coughing) and shortness of breath (with coughing).   Cardiovascular:  Positive for chest pain (with coughing).  Gastrointestinal:  Negative for abdominal pain, nausea and vomiting.  Genitourinary:  Positive for genital sores.    Objective:   Vital Signs:  BP (!) 141/85 (BP Location: Right Arm)   Pulse 90   Temp 98.3 F (36.8 C) (Oral)   Resp 20   Ht 5\' 11"  (1.803 m)   Wt 58.5 kg   SpO2 100%   BMI 17.99 kg/m   Physical Exam Vitals and nursing note reviewed.  Constitutional:      General: He is not in acute distress.    Appearance: He is ill-appearing.  HENT:     Head: Normocephalic and atraumatic.  Cardiovascular:     Rate and Rhythm: Normal rate.  Pulmonary:     Effort: Pulmonary effort is normal. No respiratory distress.     Comments: Noted wet sounding cough Abdominal:     General: Abdomen is flat.  Skin:    General: Skin is warm and dry.  Neurological:     Mental Status: He is alert.  Psychiatric:        Mood and Affect: Mood normal.        Behavior: Behavior normal.     Palliative Assessment/Data: 40%    Existing Vynca/ACP Documentation: None  Assessment & Plan:   Impression: Present on Admission:  Hyperglycemia  Acute metabolic encephalopathy  AKI (acute kidney injury) (HCC)  Severe sepsis (HCC)  Acute cystitis  Dehydration  Acute prerenal azotemia  Elevated troponin  Essential hypertension  SUMMARY OF RECOMMENDATIONS   DNR-limited Treat the treatable, continue current scope of care while admitted No escalation of care Allow patient to eat with full acceptance of risks as comfort measure Continued support of patient and family TOC consult for referral to [facility] Palliative medicine  will continue to follow  Symptom Management:  Per primary team PMT is available to assist as needed  Code Status: DNR-limited  Prognosis: < 4 weeks  Discharge Planning: Home with Hospice  Discussed with: Patient's family, medical team, nursing team, [facility] team  Thank you for allowing [place] to participate in the care of JAKENDRICK ANTAL PMT will continue to support holistically.  Time Total: 45 min  Detailed review of medical records (labs, imaging, vital signs), medically appropriate exam, discussed with treatment team, counseling and education to patient, family, & staff, documenting clinical information, medication management, coordination of care  Wynne Dust, NP Palliative Medicine Team  Team Phone # [phone] (Nights/Weekends)  07/10/2021, 8:17 AM

## 2023-11-02 DIAGNOSIS — E1100 Type 2 diabetes mellitus with hyperosmolarity without nonketotic hyperglycemic-hyperosmolar coma (NKHHC): Secondary | ICD-10-CM | POA: Diagnosis not present

## 2023-11-02 LAB — GLUCOSE, CAPILLARY
Glucose-Capillary: 270 mg/dL — ABNORMAL HIGH (ref 70–99)
Glucose-Capillary: 198 mg/dL — ABNORMAL HIGH (ref 70–99)

## 2023-11-02 NOTE — TOC Transition Note (Signed)
Transition of Care [facility]) - Discharge Note   Patient Details  Name: [name] MRN: [id number] Date of Birth: [birth year]  Transition of Care [facility]) CM/SW Contact:  Ian Sabal, RN Phone Number: 11/02/2023, 9:01 AM   Clinical Narrative:     Received secure chat from bedside nurse: hospice nurse called indicating that all equipment is at home ready for this patient to discharge home. [phone]   Called number above and informed them that patient will DC to home today.  Spoke w [name] [phone] and confirmed that all DME bed, table, BSC, O2, neb, sx, have been delivered and they are ready to accept the patient at home.  Address confirmed. PTAR forms updated.  Nurse notified that PTAR will be called soon.   Final next level of care: Home w Hospice Care Barriers to Discharge: No Barriers Identified   Patient Goals and CMS Choice   CMS Medicare.gov Compare Post Acute Care list provided to:: Patient Represenative (must comment) Choice offered to / list presented to : [facility] POA / Guardian      Discharge Placement                       Discharge Plan and Services Additional resources added to the After Visit Summary for     Discharge Planning Services: CM Consult Post Acute Care Choice: Home Health          DME Arranged: 3-N-1 DME Agency: [employer]       HH Arranged: RN, PT [facility] Agency:  [facility]) Date [facility] Agency Contacted: 11/02/23 Time HH Agency Contacted: 0901 Representative spoke with at [facility] Agency: [facility]  Social Drivers of Health (SDOH) Interventions SDOH Screenings   Food Insecurity: Patient Unable To Answer (10/26/2023)  Housing: Patient Unable To Answer (10/26/2023)  Transportation Needs: Patient Unable To Answer (10/26/2023)  Utilities: Patient Unable To Answer (10/26/2023)  Financial Resource Strain: High Risk (08/30/2022)   Received from [facility]  Tobacco Use: High Risk (10/25/2023)     Readmission Risk Interventions     No data to display

## 2023-11-02 NOTE — Progress Notes (Signed)
Patient's hospice agency [facility] indicating to leave cortrack in place and they will assess when patient arrived to his home and they assume care.

## 2023-11-02 NOTE — Discharge Summary (Signed)
[facility]  Physician Discharge Summary   Patient ID: [name] MRN: [id number] DOB/AGE: 65-Nov-1957 65 y.o.  Admit date: 10/25/2023 Discharge date:   11/02/2023   PCP: [facility]  DISCHARGE DIAGNOSES:  Hyperglycemic hyper osmolar state   Essential hypertension   Acute metabolic encephalopathy   AKI (acute kidney injury) (HCC)   Severe sepsis (HCC)   Acute cystitis   Acute respiratory failure with hypoxia (HCC)   Protein-calorie malnutrition, severe   RECOMMENDATIONS FOR OUTPATIENT FOLLOW UP: Patient going home with hospice services    CODE STATUS: DNR  DISCHARGE CONDITION: stable  Diet recommendation: Currently on tube feedings.  This can be determined further at home by hospice  INITIAL HISTORY: Patient is a 67 y.o.  male with history of DM-2, HTN, HLD, frail/cachectic at baseline-presented with altered mental status-found to have HHS-septic shock secondary to PNA/UTI-intubated-and admitted to the ICU.  Briefly required vasopressors in the ICU.  Extubated on 12/17-and transferred to [facility].     Significant events: 12/14>> to ED-altered mental status-admit to [facility]-HHS-started on insulin drip-severe sepsis due to UTI. 12/15>> worsening shortness of breath- intubated-admit to ICU-bronchoscopy-extensive white thick secretions. 12/16>> off pressors 12/17>> extubated 12/18>> transferred to [facility]   Significant studies: 11/19>> CTA chest: Stable aneurysmal disease of distal aortic arch and proximal descending thoracic aorta-5 cm in maximal diameter. 12/14>> CXR: No acute changes.-Massively dilated aortic arch. 12/16>> echo: EF 55-60%.   Significant microbiology data: 12/14>> blood culture: No growth 12/14>> urine culture: Multiple species 12/15>> tracheal aspirate: Normal respiratory flora   Procedures: 12/15-12/17>> ETT 12/15>> bronchoscopy: Extensive white secretions 12/18>>Cortrak tube   Consults: PCCM  HOSPITAL COURSE:   Acute  hypoxic respiratory failure secondary to PNA Intubated on admission-suspicion for aspiration Improved with IV antibiotics.  He has completed course of Unasyn.   Septic shock secondary to PNA/complicated UTI Required pressors in the ICU Sepsis physiology has resolved Completed course of antibiotics   Acute metabolic encephalopathy Secondary to HHS/septic shock Poorly responsive.   AKI Suspect hemodynamically mediated Improved from 2.9-1.6. Avoid nephrotoxic agents   Hypernatremia Secondary to free water deficit-n.p.o.-secondary to oropharyngeal dysphagia Getting free water through feeding tube.  Sodium levels have improved.   Hypokalemia/hypomagnesemia Supplemented.  Recheck labs tomorrow.   Hypophosphatemia Improved   Anshul hypertension BP stable Continue Coreg   Aortic aneurysm Recent CTA prior to this hospitalization with significant dilatation of the distal aortic arch Currently not in any shape for vascular surgery evaluation-suspect this will need to be pursued in the outpatient setting-as this is a chronic issue. Continue beta-blocker.   HLD Statin   HHS DM-2 (A1c 14.2 on 12/14) with uncontrolled hyperglycemia Noted to be on glargine and SSI.  Dose of glargine was increased yesterday.   COPD Stable Bronchodilators   GERD Pepcid   Oropharyngeal dysphagia Likely due to severe debility/deconditioning Cortrak tube placed 12/18 Underwent modified barium swallow on 12/20.  Remains at high risk for aspiration.   Debility/deconditioning PT/OT eval-SNF contemplated-however family looking to take him home with hospice   Palliative care DNR in place. Palliative care following.  Plan is for transition to hospice care at home.   Nutrition Status: Nutrition Problem: Severe Malnutrition Etiology: chronic illness Signs/Symptoms: severe muscle depletion, severe fat depletion Interventions: Refer to RD note for recommendations    Patient is stable.  Okay for  discharge home with hospice.  PERTINENT LABS:  The results of significant diagnostics from this hospitalization (including imaging, microbiology, ancillary and laboratory) are listed below for reference.  Microbiology: Recent Results (from the past 240 hours)  Culture, blood (routine x 2)     Status: None   Collection Time: 10/25/23  5:07 PM   Specimen: BLOOD  Result Value Ref Range Status   Specimen Description BLOOD SITE NOT SPECIFIED  Final   Special Requests   Final    BOTTLES DRAWN AEROBIC AND ANAEROBIC Blood Culture results may not be optimal due to an inadequate volume of blood received in culture bottles   Culture   Final    NO GROWTH 5 DAYS Performed at [facility], [address], [place], [place] [address]    Report Status 10/30/2023 FINAL  Final  Culture, blood (routine x 2)     Status: None   Collection Time: 10/25/23  5:33 PM   Specimen: BLOOD  Result Value Ref Range Status   Specimen Description BLOOD LEFT ANTECUBITAL  Final   Special Requests   Final    BOTTLES DRAWN AEROBIC AND ANAEROBIC Blood Culture results may not be optimal due to an inadequate volume of blood received in culture bottles   Culture   Final    NO GROWTH 5 DAYS Performed at [facility], [address] [place], [place] [address]    Report Status 10/30/2023 FINAL  Final  Culture, OB Urine     Status: Abnormal   Collection Time: 10/25/23 10:57 PM   Specimen: Urine, Random  Result Value Ref Range Status   Specimen Description URINE, RANDOM  Final   Special Requests   Final    NONE Performed at [facility], [address] [place], [place] [address]    Culture MULTIPLE SPECIES PRESENT, SUGGEST RECOLLECTION (A)  Final   Report Status 10/27/2023 FINAL  Final  Culture, Respiratory w Gram Stain     Status: None   Collection Time: 10/26/23  3:31 AM   Specimen: Tracheal Aspirate; Respiratory  Result Value Ref Range Status   Specimen Description TRACHEAL ASPIRATE  Final    Special Requests NONE  Final   Gram Stain   Final    FEW WBC PRESENT, PREDOMINANTLY PMN FEW GRAM POSITIVE RODS FEW BUDDING YEAST SEEN RARE GRAM NEGATIVE RODS RARE GRAM POSITIVE COCCI    Culture   Final    FEW Normal respiratory flora-no Staph aureus or Pseudomonas seen Performed at [facility], [address] [place], [place] [address]    Report Status 10/28/2023 FINAL  Final  MRSA Next Gen by PCR, Nasal     Status: None   Collection Time: 10/26/23  3:50 AM   Specimen: Nasal Mucosa; Nasal Swab  Result Value Ref Range Status   MRSA by PCR Next Gen NOT DETECTED NOT DETECTED Final    Comment: (NOTE) The GeneXpert MRSA Assay (FDA approved for NASAL specimens only), is one component of a comprehensive MRSA colonization surveillance program. It is not intended to diagnose MRSA infection nor to guide or monitor treatment for MRSA infections. Test performance is not FDA approved in patients less than 80 years old. Performed at [facility], [address], [place], [place] [address]      Labs:   Basic Metabolic Panel: Recent Labs  Lab 10/28/23 [phone] 10/28/23 0729 10/29/23 0225 10/29/23 1139 10/29/23 1644 10/30/23 0508 10/30/23 1845 10/31/23 0233 10/31/23 0823 11/01/23 0507  NA 146*  --  150*  --   --  150*  --  152*  --  148*  K 4.1  --  3.3*  --   --  3.0*  --  5.1  --  3.4*  CL 115*  --  115*  --   --  120*  --  121*  --  115*  CO2 24  --  17*  --   --  20*  --  14*  --  20*  GLUCOSE 175*  --  237*  --   --  365*  --  325*  --  273*  BUN 37*  --  30*  --   --  40*  --  37*  --  29*  CREATININE 1.82*  --  1.83*  --   --  1.78*  --  1.64*  --  1.60*  CALCIUM 8.6*  --  8.7*  --   --  9.1  --  9.4  --  8.2*  MG 2.0  --   --  2.3 2.2 2.3 2.1  --   --  1.6*  PHOS  --    < > 3.5 2.3* 3.5 1.3* 2.2*  --  1.5* 4.4   < > = values in this interval not displayed.   Liver Function Tests: Recent Labs  Lab 10/30/23 0508  AST 33  ALT 36  ALKPHOS 128*  BILITOT 1.1   PROT 6.9  ALBUMIN 2.6*   CBC: Recent Labs  Lab 10/28/23 0723 10/29/23 0225 10/30/23 0508 10/31/23 0233  WBC 16.3* 19.0* 15.0* 16.3*  HGB 10.6* 11.9* 13.5 13.2  HCT 32.0* 35.7* 41.3 41.7  MCV 93.0 92.5 93.0 96.1  PLT 87* 111* 152 145*   Cardiac Enzymes: Recent Labs  Lab 10/26/23 1112  CKTOTAL 79   BNP: BNP (last 3 results) Recent Labs    09/30/23 0800 10/26/23 0130  BNP 48.1 955.3*     CBG: Recent Labs  Lab 11/01/23 1552 11/01/23 1949 11/01/23 2333 11/02/23 0321 11/02/23 0757  GLUCAP 237* 263* 257* 270* 198*     IMAGING STUDIES DG Swallowing Func-Speech Pathology Result Date: 10/31/2023 Table formatting from the original result was not included. Modified Barium Swallow Study Patient Details Name: TERAN GIROUX MRN: [id number] Date of Birth: May 23, 1956 Today's Date: 10/31/2023 HPI/PMH: HPI: ALESSANDER TA is a 67 yo male presenting to ED 12/14 with AMS. Admitted with septic shock due to acute UTI and acute respiratory failure in the setting of PNA (aspiration v community acquired). ETT 12/15-12/17. PMH includes polysubstance abuse, T2DM, HTN, HLD Clinical Impression: Clinical Impression: Pt continues to present with a severe oropharyngeal dysphagia with little to no improvement since previous MBS now further complicated by pt's participation. He was not responsive to reasoning and did not elicit a cued cough or swallow throughout the entirety of the study. He held boluses in his oral cavity for prolonged amounts of time and refused to expectorate. Pt silently aspirated thin and nectar thick liquids. The majority of each bolus resides in pt's valleculae and pyriform sinuses and pt is unable to participate in use of compensatory strategies to clear. This results in airway invasion after the swallow as well with residuals of all boluses, including purees. Safest diet recommendation would be to continue NPO status. Discussed with MD, who states pt's family is now willing to  accept risk of aspiration. SLP will initiate a diet and s/o. Factors that may increase risk of adverse event in presence of aspiration Rubye Oaks & Clearance Coots 2021): Factors that may increase risk of adverse event in presence of aspiration Rubye Oaks & Clearance Coots 2021): Poor general health  and/or compromised immunity; Reduced cognitive function; Frail or deconditioned; Inadequate oral hygiene; Weak cough; Aspiration of thick, dense, and/or acidic materials; Frequent aspiration of large volumes Recommendations/Plan: Swallowing Evaluation Recommendations Swallowing Evaluation Recommendations Recommendations: PO diet PO Diet Recommendation: Regular; Thin liquids (Level 0) Liquid Administration via: Cup; Straw Medication Administration: Crushed with puree Supervision: Patient able to self-feed; Full supervision/cueing for swallowing strategies Swallowing strategies  : Minimize environmental distractions; Slow rate; Small bites/sips Postural changes: Position pt fully upright for meals; Stay upright 30-60 min after meals Oral care recommendations: Oral care QID (4x/day); Oral care before ice chips/water; Use suctioning for oral care Treatment Plan Treatment Plan Treatment recommendations: Therapy as outlined in treatment plan below Follow-up recommendations: Home health SLP Functional status assessment: Patient has had a recent decline in their functional status and/or demonstrates limited ability to make significant improvements in function in a reasonable and predictable amount of time. Recommendations Recommendations for follow up therapy are one component of a multi-disciplinary discharge planning process, led by the attending physician.  Recommendations may be updated based on patient status, additional functional criteria and insurance authorization. Assessment: Orofacial Exam: Orofacial Exam Oral Cavity: Oral Hygiene: Lingual coating; Dried secretions Oral Cavity - Dentition: Poor condition; Missing dentition Orofacial  Anatomy: WFL Oral Motor/Sensory Function: WFL Anatomy: Anatomy: Suspected cervical osteophytes Boluses Administered: Boluses Administered Boluses Administered: Thin liquids (Level 0); Mildly thick liquids (Level 2, nectar thick); Puree  Oral Impairment Domain: Oral Impairment Domain Lip Closure: No labial escape Tongue control during bolus hold: Cohesive bolus between tongue to palatal seal Bolus transport/lingual motion: Delayed initiation of tongue motion (oral holding) Oral residue: Trace residue lining oral structures Location of oral residue : Tongue; Palate; Lateral sulci; Floor of mouth Initiation of pharyngeal swallow : Pyriform sinuses  Pharyngeal Impairment Domain: Pharyngeal Impairment Domain Soft palate elevation: No bolus between soft palate (SP)/pharyngeal wall (PW) Laryngeal elevation: Partial superior movement of thyroid cartilage/partial approximation of arytenoids to epiglottic petiole Anterior hyoid excursion: Partial anterior movement Epiglottic movement: Partial inversion Laryngeal vestibule closure: Incomplete, narrow column air/contrast in laryngeal vestibule Pharyngeal stripping wave : Present - complete Pharyngeal contraction (A/P view only): N/A Pharyngoesophageal segment opening: Complete distension and complete duration, no obstruction of flow Tongue base retraction: Wide column of contrast or air between tongue base and PPW Pharyngeal residue: Majority of contrast within or on pharyngeal structures Location of pharyngeal residue: Tongue base; Valleculae; Pyriform sinuses  Esophageal Impairment Domain: No data recorded Pill: No data recorded Penetration/Aspiration Scale Score: Penetration/Aspiration Scale Score 5.  Material enters airway, CONTACTS cords and not ejected out: Puree 7.  Material enters airway, passes BELOW cords and not ejected out despite cough attempt by patient: Thin liquids (Level 0) 8.  Material enters airway, passes BELOW cords without attempt by patient to eject out  (silent aspiration) : Thin liquids (Level 0); Mildly thick liquids (Level 2, nectar thick) Compensatory Strategies: Compensatory Strategies Compensatory strategies: No   General Information: Caregiver present: No  Diet Prior to this Study: NPO; Cortrak/Small bore NG tube   Temperature : Normal   Respiratory Status: WFL   Supplemental O2: None (Room air)   History of Recent Intubation: Yes  Behavior/Cognition: Alert; Uncooperative; Requires cueing; Agitated Self-Feeding Abilities: Needs assist with self-feeding Baseline vocal quality/speech: Dysphonic Volitional Cough: Unable to elicit Volitional Swallow: Unable to elicit Exam Limitations: Poor participation Goal Planning: Prognosis for improved oropharyngeal function: Fair Barriers to Reach Goals: Cognitive deficits; Time post onset; Severity of deficits No data recorded Patient/Family Stated Goal: wants water  Consulted and agree with results and recommendations: Patient; Advanced practice provider Pain: Pain Assessment Pain Assessment: Faces Faces Pain Scale: 0 Pain Location: R hand Pain Descriptors / Indicators: Aching Pain Intervention(s): Monitored during session End of Session: Start Time:SLP Start Time (ACUTE ONLY): 1334 Stop Time: SLP Stop Time (ACUTE ONLY): 1400 Time Calculation:SLP Time Calculation (min) (ACUTE ONLY): 26 min Charges: SLP Evaluations $ SLP Speech Visit: 1 Visit SLP Evaluations $MBS Swallow: 1 Procedure $Swallowing Treatment: 1 Procedure SLP visit diagnosis: SLP Visit Diagnosis: Dysphagia, oropharyngeal phase (R13.12) Past Medical History: Past Medical History: Diagnosis Date  COPD (chronic obstructive pulmonary disease) (HCC)   Diabetes mellitus without complication (HCC)   Hypertension  Past Surgical History: No past surgical history on file. Gwynneth Aliment, M.A., CF-SLP Speech Language Pathology, Acute Rehabilitation Services Secure Chat preferred [phone] 10/31/2023, 2:51 PM  DG Chest Port 1 View Result Date: 10/30/2023 CLINICAL  DATA:  Short of breath, COPD EXAM: PORTABLE CHEST 1 VIEW COMPARISON:  10/27/2023 FINDINGS: Single frontal view of the chest demonstrates interval removal of the endotracheal tube. Enteric catheter passes below diaphragm tip excluded by collimation. Cardiac silhouette is stable. Continued enlargement of the aortic knob. Scattered bilateral airspace disease again noted, greatest at the left lung base, without appreciable change since prior study. No effusion or pneumothorax. No acute bony abnormalities. IMPRESSION: 1. Scattered bilateral airspace disease, greatest at the left lung base, compatible with pneumonia. 2. Stable enlargement of the aortic knob consistent with known history of thoracic aortic aneurysm. Electronically Signed   By: Sharlet Salina M.D.   On: 10/30/2023 09:56   DG Abd Portable 1V Result Date: 10/29/2023 CLINICAL DATA:  Enteric catheter placement EXAM: PORTABLE ABDOMEN - 1 VIEW COMPARISON:  10/27/2023 FINDINGS: Frontal view of the lower chest and upper abdomen demonstrates enteric catheter passing below diaphragm tip overlying the gastric body. There is patchy bibasilar airspace disease, increased since prior study. Oral contrast is seen within the transverse colon. IMPRESSION: 1. Enteric catheter tip projecting over the gastric body. 2. Progressive bibasilar airspace disease. Electronically Signed   By: Sharlet Salina M.D.   On: 10/29/2023 11:36   DG Swallowing Func-Speech Pathology Result Date: 10/28/2023 Table formatting from the original result was not included. Modified Barium Swallow Study Patient Details Name: MARVIE FELDE MRN: [id number] Date of Birth: [birth year] Today's Date: 10/28/2023 HPI/PMH: HPI: TRIGGER KOSKIE is a 67 yo male presenting to ED 12/14 with AMS. Admitted with septic shock due to acute UTI and acute respiratory failure in the setting of PNA (aspiration v community acquired). ETT 12/15-12/17. PMH includes polysubstance abuse, T2DM, HTN, HLD Clinical Impression:  Clinical Impression: Pt presents with a severe oropharyngeal dysphagia characterized by impaired timing and sensation. Suspect pt has dysphagia at baseline that is currently exacerbated secondary to intubation, deconditioning, and cognitive deficits. Pt continues to have audible secretions with poor management. Note significant pharyngeal residue and incomplete laryngeal elevation, resulting in repeated aspiration with weak coughing and subsequent aspiration of previously aspirated material mixed with secretions. He aspirated gross amounts of thin, nectar thick, and honey thick liquids before initiating an ineffective cough. Recommend he remain NPO except for 2-3 ice chips following thorough oral care QID. This will aid in loosening secretions and preserving swallow function as SLP initiates therapy targeting oropharyngeal hygiene and cough strength. Will f/u. Factors that may increase risk of adverse event in presence of aspiration Rubye Oaks & Clearance Coots 2021): Factors that may increase risk of adverse event in presence of aspiration Rubye Oaks & Clearance Coots 2021):  Poor general health and/or compromised immunity; Reduced cognitive function; Frail or deconditioned; Inadequate oral hygiene; Weak cough; Aspiration of thick, dense, and/or acidic materials; Frequent aspiration of large volumes Recommendations/Plan: Swallowing Evaluation Recommendations Swallowing Evaluation Recommendations Recommendations: NPO; Alternative means of nutrition - NG Tube; Ice chips PRN after oral care Medication Administration: Via alternative means Oral care recommendations: Oral care QID (4x/day); Oral care before ice chips/water; Use suctioning for oral care Treatment Plan Treatment Plan Treatment recommendations: Therapy as outlined in treatment plan below Follow-up recommendations: Home health SLP Functional status assessment: Patient has had a recent decline in their functional status and demonstrates the ability to make significant improvements  in function in a reasonable and predictable amount of time. Treatment frequency: Min 2x/week Treatment duration: 2 weeks Interventions: Aspiration precaution training; Oropharyngeal exercises; Patient/family education; Trials of upgraded texture/liquids; Diet toleration management by SLP; Respiratory muscle strength training Recommendations Recommendations for follow up therapy are one component of a multi-disciplinary discharge planning process, led by the attending physician.  Recommendations may be updated based on patient status, additional functional criteria and insurance authorization. Assessment: Orofacial Exam: Orofacial Exam Oral Cavity: Oral Hygiene: Lingual coating; Dried secretions Oral Cavity - Dentition: Poor condition; Missing dentition Orofacial Anatomy: WFL Oral Motor/Sensory Function: WFL Anatomy: No data recorded Boluses Administered: Boluses Administered Boluses Administered: Thin liquids (Level 0); Mildly thick liquids (Level 2, nectar thick); Moderately thick liquids (Level 3, honey thick)  Oral Impairment Domain: Oral Impairment Domain Lip Closure: No labial escape Tongue control during bolus hold: Cohesive bolus between tongue to palatal seal Bolus transport/lingual motion: Brisk tongue motion Oral residue: Trace residue lining oral structures Location of oral residue : Tongue; Palate Initiation of pharyngeal swallow : Pyriform sinuses  Pharyngeal Impairment Domain: Pharyngeal Impairment Domain Soft palate elevation: No bolus between soft palate (SP)/pharyngeal wall (PW) Laryngeal elevation: Partial superior movement of thyroid cartilage/partial approximation of arytenoids to epiglottic petiole Anterior hyoid excursion: Partial anterior movement Epiglottic movement: Partial inversion Laryngeal vestibule closure: Incomplete, narrow column air/contrast in laryngeal vestibule Pharyngeal stripping wave : Present - complete Pharyngeal contraction (A/P view only): N/A Pharyngoesophageal segment  opening: Complete distension and complete duration, no obstruction of flow Tongue base retraction: Narrow column of contrast or air between tongue base and PPW Pharyngeal residue: Collection of residue within or on pharyngeal structures Location of pharyngeal residue: Tongue base; Valleculae; Pyriform sinuses  Esophageal Impairment Domain: No data recorded Pill: No data recorded Penetration/Aspiration Scale Score: Penetration/Aspiration Scale Score 7.  Material enters airway, passes BELOW cords and not ejected out despite cough attempt by patient: Thin liquids (Level 0); Mildly thick liquids (Level 2, nectar thick); Moderately thick liquids (Level 3, honey thick) Compensatory Strategies: Compensatory Strategies Compensatory strategies: No   General Information: Caregiver present: No  Diet Prior to this Study: NPO   Temperature : Normal   Respiratory Status: WFL   Supplemental O2: None (Room air)   History of Recent Intubation: Yes  Behavior/Cognition: Alert; Cooperative Self-Feeding Abilities: Needs assist with self-feeding Baseline vocal quality/speech: Dysphonic Volitional Cough: Able to elicit Volitional Swallow: Able to elicit Exam Limitations: Excessive movement Goal Planning: Prognosis for improved oropharyngeal function: Fair Barriers to Reach Goals: Cognitive deficits; Time post onset; Severity of deficits No data recorded Patient/Family Stated Goal: none stated Consulted and agree with results and recommendations: Patient; Nurse Pain: Pain Assessment Pain Assessment: Faces Faces Pain Scale: 0 Facial Expression: 0 Body Movements: 0 Muscle Tension: 0 Compliance with ventilator (intubated pts.): 0 Vocalization (extubated pts.): N/A CPOT Total: 0 Pain  Location: HA Pain Descriptors / Indicators: Aching; Discomfort; Grimacing Pain Intervention(s): Monitored during session End of Session: Start Time:SLP Start Time (ACUTE ONLY): 1417 Stop Time: SLP Stop Time (ACUTE ONLY): 1435 Time Calculation:SLP Time Calculation  (min) (ACUTE ONLY): 18 min Charges: SLP Evaluations $ SLP Speech Visit: 1 Visit SLP Evaluations $BSS Swallow: 1 Procedure $MBS Swallow: 1 Procedure SLP visit diagnosis: SLP Visit Diagnosis: Dysphagia, oropharyngeal phase (R13.12) Past Medical History: Past Medical History: Diagnosis Date  COPD (chronic obstructive pulmonary disease) (HCC)   Diabetes mellitus without complication (HCC)   Hypertension  Past Surgical History: No past surgical history on file. Gwynneth Aliment, M.A., CF-SLP Speech Language Pathology, Acute Rehabilitation Services Secure Chat preferred [phone] 10/28/2023, 3:14 PM  DG Abd 1 View Result Date: 10/27/2023 CLINICAL DATA:  Nasogastric tube present. EXAM: ABDOMEN - 1 VIEW COMPARISON:  Chest radiograph 10/27/2023 FINDINGS: Nasogastric tube tip terminates in the proximal stomach and the side hole of the tube is near the GE junction. Nonobstructive bowel gas pattern in the abdomen. The pelvis is not imaged. Densities at the left lung base are again noted. Evidence for atherosclerotic calcifications in the region of the splenic artery. IMPRESSION: Nasogastric tube tip terminates in the proximal stomach and the side hole is near the GE junction. Consider advancing tube further into the stomach. Electronically Signed   By: Richarda Overlie M.D.   On: 10/27/2023 13:25   DG CHEST PORT 1 VIEW Result Date: 10/27/2023 CLINICAL DATA:  Aspiration into airway. EXAM: PORTABLE CHEST 1 VIEW COMPARISON:  10/26/2023 FINDINGS: Endotracheal tube is 4.0 cm above the carina. Nasogastric tube tip terminates in the proximal stomach and the side hole is near the GE junction. Increased densities at the left lung base concerning for an area of pneumonia. Heart size is stable. Again noted is enlargement of the aortic arch compatible with the known aneurysm. Negative for a pneumothorax. IMPRESSION: 1. Increased densities at the left lung base concerning for pneumonia. 2. Endotracheal tube is in good position. 3.  Nasogastric tube tip terminates in the proximal stomach and the side hole is near the GE junction. Consider advancing the tube. Electronically Signed   By: Richarda Overlie M.D.   On: 10/27/2023 13:21   DG Abd 1 View Result Date: 10/27/2023 CLINICAL DATA:  [id number] Encounter for orogastric (OG) tube placement [id number] EXAM: ABDOMEN - 1 VIEW COMPARISON:  Chest x-ray 10/26/2023 FINDINGS: Limited radiograph of the lower chest and upper abdomen was obtained for the purposes of enteric tube localization. The previously seen enteric tube has been removed and is no longer identified. IMPRESSION: Enteric tube has been removed and is no longer identified. Electronically Signed   By: Duanne Guess D.O.   On: 10/27/2023 13:18   ECHOCARDIOGRAM COMPLETE Result Date: 10/27/2023    ECHOCARDIOGRAM REPORT   Patient Name:   ELSWORTH CASTEEL Date of Exam: 10/27/2023 Medical Rec #:  [id number]     Height:       71.0 in Accession #:    [id number]    Weight:       110.9 lb Date of Birth:  01/28/1956     BSA:          1.641 m Patient Age:    67 years      BP:           91/60 mmHg Patient Gender: M             HR:           90  bpm. Exam Location:  Inpatient Procedure: 2D Echo, Color Doppler and Cardiac Doppler Indications:    Shock  History:        Patient has no prior history of Echocardiogram examinations.  Sonographer:    Irving Burton Senior RDCS Referring Phys: Janann Colonel  Sonographer Comments: Technically difficult study due to thin body habitus and ventilator, IMPRESSIONS  1. Left ventricular ejection fraction, by estimation, is 55 to 60%. The left ventricle has normal function. The left ventricle has no regional wall motion abnormalities. Left ventricular diastolic parameters are consistent with Grade I diastolic dysfunction (impaired relaxation).  2. Right ventricular systolic function is normal. The right ventricular size is normal. There is normal pulmonary artery systolic pressure. The estimated right ventricular systolic  pressure is 29.1 mmHg.  3. The mitral valve is normal in structure. No evidence of mitral valve regurgitation. No evidence of mitral stenosis.  4. The aortic valve is tricuspid. Aortic valve regurgitation is not visualized. No aortic stenosis is present.  5. The inferior vena cava is dilated in size with <50% respiratory variability, suggesting right atrial pressure of 15 mmHg. FINDINGS  Left Ventricle: Left ventricular ejection fraction, by estimation, is 55 to 60%. The left ventricle has normal function. The left ventricle has no regional wall motion abnormalities. The left ventricular internal cavity size was normal in size. There is  no left ventricular hypertrophy. Left ventricular diastolic parameters are consistent with Grade I diastolic dysfunction (impaired relaxation). Right Ventricle: The right ventricular size is normal. No increase in right ventricular wall thickness. Right ventricular systolic function is normal. There is normal pulmonary artery systolic pressure. The tricuspid regurgitant velocity is 1.88 m/s, and  with an assumed right atrial pressure of 15 mmHg, the estimated right ventricular systolic pressure is 29.1 mmHg. Left Atrium: Left atrial size was normal in size. Right Atrium: Right atrial size was normal in size. Pericardium: Trivial pericardial effusion is present. Mitral Valve: The mitral valve is normal in structure. Mild mitral annular calcification. No evidence of mitral valve regurgitation. No evidence of mitral valve stenosis. Tricuspid Valve: The tricuspid valve is normal in structure. Tricuspid valve regurgitation is trivial. Aortic Valve: The aortic valve is tricuspid. Aortic valve regurgitation is not visualized. No aortic stenosis is present. Pulmonic Valve: The pulmonic valve was normal in structure. Pulmonic valve regurgitation is trivial. Aorta: The aortic root is normal in size and structure. Venous: The inferior vena cava is dilated in size with less than 50% respiratory  variability, suggesting right atrial pressure of 15 mmHg. IAS/Shunts: No atrial level shunt detected by color flow Doppler.  LEFT VENTRICLE PLAX 2D LVIDd:         3.60 cm     Diastology LVIDs:         3.00 cm     LV e' medial:    7.94 cm/s LV PW:         0.70 cm     LV E/e' medial:  6.9 LV IVS:        1.20 cm     LV e' lateral:   11.20 cm/s LVOT diam:     2.20 cm     LV E/e' lateral: 4.9 LV SV:         65 LV SV Index:   40 LVOT Area:     3.80 cm  LV Volumes (MOD) LV vol d, MOD A4C: 48.2 ml LV vol s, MOD A4C: 19.5 ml LV SV MOD A4C:     48.2 ml RIGHT  VENTRICLE RV S prime:     10.80 cm/s TAPSE (M-mode): 1.9 cm LEFT ATRIUM             Index        RIGHT ATRIUM          Index LA diam:        2.30 cm 1.40 cm/m   RA Area:     9.31 cm LA Vol (A2C):   34.4 ml 20.96 ml/m  RA Volume:   18.40 ml 11.21 ml/m LA Vol (A4C):   47.2 ml 28.76 ml/m LA Biplane Vol: 41.1 ml 25.04 ml/m  AORTIC VALVE LVOT Vmax:   86.20 cm/s LVOT Vmean:  65.000 cm/s LVOT VTI:    0.172 m  AORTA Ao Root diam: 3.20 cm MITRAL VALVE               TRICUSPID VALVE MV Area (PHT): 2.49 cm    TR Peak grad:   14.1 mmHg MV Decel Time: 305 msec    TR Vmax:        188.00 cm/s MV E velocity: 54.50 cm/s MV A velocity: 74.40 cm/s  SHUNTS MV E/A ratio:  0.73        Systemic VTI:  0.17 m                            Systemic Diam: 2.20 cm Dalton McleanMD Electronically signed by Wilfred Lacy Signature Date/Time: 10/27/2023/9:50:16 AM    Final    VAS [place] LOWER EXTREMITY VENOUS (DVT) Result Date: 10/27/2023  Lower Venous DVT Study Patient Name:  ELKANAH GOURNEAU  Date of Exam:   10/26/2023 Medical Rec #: [id number]      Accession #:    [id number] Date of Birth: 26-Mar-1956      Patient Gender: M Patient Age:   48 years Exam Location:  [facility] Procedure:      VAS [place] LOWER EXTREMITY VENOUS (DVT) Referring Phys: Janann Colonel --------------------------------------------------------------------------------  Indications: Hyperglycemia (>800), hypoxic  respiratory failure, UTI,.  Limitations: Patient awake, but altered on ventilator, constantly moving, and cachexia. Comparison Study: No prior study on file Performing Technologist: Sherren Kerns RVS  Examination Guidelines: A complete evaluation includes B-mode imaging, spectral Doppler, color Doppler, and power Doppler as needed of all accessible portions of each vessel. Bilateral testing is considered an integral part of a complete examination. Limited examinations for reoccurring indications may be performed as noted. The reflux portion of the exam is performed with the patient in reverse Trendelenburg.  +---------+---------------+---------+-----------+----------+---------------+ RIGHT    CompressibilityPhasicitySpontaneityPropertiesThrombus Aging  +---------+---------------+---------+-----------+----------+---------------+ CFV      Full           Yes      Yes                                  +---------+---------------+---------+-----------+----------+---------------+ SFJ      Full                                                         +---------+---------------+---------+-----------+----------+---------------+ FV Prox  Full                                                         +---------+---------------+---------+-----------+----------+---------------+  FV Mid   Full                                                         +---------+---------------+---------+-----------+----------+---------------+ FV Distal                                             patent by color +---------+---------------+---------+-----------+----------+---------------+ PFV      Full                                                         +---------+---------------+---------+-----------+----------+---------------+ POP      Full           Yes      Yes                                  +---------+---------------+---------+-----------+----------+---------------+ PTV      Full                                                          +---------+---------------+---------+-----------+----------+---------------+ PERO     Full                                                         +---------+---------------+---------+-----------+----------+---------------+ Gastroc  Full                                                         +---------+---------------+---------+-----------+----------+---------------+   +---------+---------------+---------+-----------+----------+--------------+ LEFT     CompressibilityPhasicitySpontaneityPropertiesThrombus Aging +---------+---------------+---------+-----------+----------+--------------+ CFV      Full           Yes      Yes                                 +---------+---------------+---------+-----------+----------+--------------+ SFJ      Full                                                        +---------+---------------+---------+-----------+----------+--------------+ FV Prox  Full                                                        +---------+---------------+---------+-----------+----------+--------------+  FV Mid   Full                                                        +---------+---------------+---------+-----------+----------+--------------+ FV DistalFull                                                        +---------+---------------+---------+-----------+----------+--------------+ PFV      Full                                                        +---------+---------------+---------+-----------+----------+--------------+ POP      Full           Yes      Yes                                 +---------+---------------+---------+-----------+----------+--------------+ PTV      Full                                                        +---------+---------------+---------+-----------+----------+--------------+ PERO     Full                                                         +---------+---------------+---------+-----------+----------+--------------+     Summary: BILATERAL: - No evidence of deep vein thrombosis seen in the lower extremities, bilaterally. -No evidence of popliteal cyst, bilaterally.   *See table(s) above for measurements and observations. Electronically signed by Heath Lark on 10/27/2023 at 7:35:18 AM.    Final    DG CHEST PORT 1 VIEW Result Date: 10/26/2023 CLINICAL DATA:  Intubation EXAM: PORTABLE CHEST 1 VIEW COMPARISON:  Chest x-ray 10/27/2023 FINDINGS: Endotracheal tube tip is 3.5 cm above the carina. Enteric tube extends below the diaphragm. There stable enlargement of the thoracic aortic arch. The lungs are clear. There is no pleural effusion or pneumothorax. The heart is normal in size. Osseous structures are stable. IMPRESSION: 1. Endotracheal tube tip is 3.5 cm above the carina. 2. Enteric tube extends below the diaphragm. 3. Stable enlargement of the thoracic aortic arch. Electronically Signed   By: Darliss Cheney M.D.   On: 10/26/2023 03:40   DG Chest Port 1 View Result Date: 10/26/2023 CLINICAL DATA:  Hypoxia EXAM: PORTABLE CHEST 1 VIEW COMPARISON:  CT chest 09/30/2023 FINDINGS: Again seen is aneurysmal dilatation of the distal arch of the aorta. Heart size is within normal limits. There is no focal lung infiltrate, pleural effusion or pneumothorax. No acute fractures are seen. IMPRESSION: 1. No active disease. 2. 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 Electronically Signed  By: Darliss Cheney M.D.   On: 10/26/2023 00:58   DG Chest Port 1 View Result Date: 10/26/2023 CLINICAL DATA:  Cough, best possible images due to patient condition. EXAM: PORTABLE CHEST 1 VIEW COMPARISON:  10/25/2023 at 5:51 p.m. FINDINGS: Stable cardiomediastinal silhouette. Marked dilation of the aortic knob similar to prior. Aortic atherosclerotic calcification. No focal consolidation, pleural effusion, or pneumothorax. No displaced rib  fractures. IMPRESSION: 1. No change from earlier today. Marked dilation of the aortic knob. Electronically Signed   By: Minerva Fester M.D.   On: 10/26/2023 00:01   DG Chest Portable 1 View Result Date: 10/25/2023 CLINICAL DATA:  Cough, COPD. EXAM: PORTABLE CHEST 1 VIEW COMPARISON:  09/29/2023. FINDINGS: Normal cardiac silhouette. Massively dilated aortic arch. Lungs are clear. No pneumothorax or pleural effusion. Osseous structures are grossly intact. IMPRESSION: Massively dilated aortic arch. No definite interval change radiographically, but CT would be more definitive. Electronically Signed   By: Layla Maw M.D.   On: 10/25/2023 18:53    DISCHARGE EXAMINATION: Please review progress note from earlier today  DISPOSITION: Home  Allergies as of 11/02/2023       Reactions   Codeine Phosphate    REACTION: unspecified        Medication List     STOP taking these medications    amLODipine 10 MG tablet Commonly known as: NORVASC   atorvastatin 20 MG tablet Commonly known as: LIPITOR   BD Pen Needle Nano U/F 32G X 4 MM Misc Generic drug: Insulin Pen Needle   lansoprazole 15 MG capsule Commonly known as: PREVACID   MUCUS RELIEF ADULT PO       TAKE these medications    acetaminophen 500 MG tablet Commonly known as: TYLENOL Take 500-1,000 mg by mouth every 6 (six) hours as needed for moderate pain (pain score 4-6).   Basaglar KwikPen 100 UNIT/ML Inject 16 Units into the skin at bedtime.   carvedilol 6.25 MG tablet Commonly known as: COREG Take 1 tablet (6.25 mg total) by mouth daily.   docusate 50 MG/5ML liquid Commonly known as: COLACE Place 10 mLs (100 mg total) into feeding tube 2 (two) times daily.   LORazepam 2 MG/ML concentrated solution Commonly known as: ATIVAN Take 0.5 mLs (1 mg total) by mouth every 4 (four) hours as needed for anxiety.   morphine 20 MG/ML concentrated solution Commonly known as: ROXANOL Take 0.25 mLs (5 mg total) by mouth  every 2 (two) hours as needed for severe pain (pain score 7-10).   polyethylene glycol 17 g packet Commonly known as: MIRALAX / GLYCOLAX Place 17 g into feeding tube daily.          Follow-up Information     Thana Ates, MD Follow up.   Specialty: Internal Medicine Contact information: [address] [place] [address] [phone]         [facility] Follow up.   Why: Home Health RN and Physical Therapy-agency will call to arrange appts Contact information: [address] [place] [address] [phone]                 TOTAL DISCHARGE TIME: 35 minutes  Simone Tuckey Rito Ehrlich  [facility] Pager on [email or web address]  11/02/2023, 10:07 AM

## 2023-11-02 NOTE — Progress Notes (Signed)
Patient is alert with confusion. Patient has chest congestion with lungs sound coarse, it improved with breathing treatment and Robitussin Po . Patient has three lose watery yellow , brown bowel movements, urine out put. PT tolerate well osmolite @ 45 mls/ hr.  Core track in place, minimal residual. Rash on the back and perineum, was applied fungal power to perineum.

## 2023-11-02 NOTE — Plan of Care (Signed)
  Problem: Education: Goal: Knowledge of General Education information will improve Description: Including pain rating scale, medication(s)/side effects and non-pharmacologic comfort measures Outcome: Completed/Met   Problem: Health Behavior/Discharge Planning: Goal: Ability to manage health-related needs will improve Outcome: Completed/Met   Problem: Clinical Measurements: Goal: Ability to maintain clinical measurements within normal limits will improve Outcome: Completed/Met Goal: Will remain free from infection Outcome: Completed/Met Goal: Diagnostic test results will improve Outcome: Completed/Met Goal: Respiratory complications will improve Outcome: Completed/Met Goal: Cardiovascular complication will be avoided Outcome: Completed/Met   Problem: Activity: Goal: Risk for activity intolerance will decrease Outcome: Completed/Met   Problem: Nutrition: Goal: Adequate nutrition will be maintained Outcome: Completed/Met   Problem: Coping: Goal: Level of anxiety will decrease Outcome: Completed/Met   Problem: Elimination: Goal: Will not experience complications related to bowel motility Outcome: Completed/Met Goal: Will not experience complications related to urinary retention Outcome: Completed/Met   Problem: Pain Management: Goal: General experience of comfort will improve Outcome: Completed/Met   Problem: Safety: Goal: Ability to remain free from injury will improve Outcome: Completed/Met   Problem: Skin Integrity: Goal: Risk for impaired skin integrity will decrease Outcome: Completed/Met   Problem: Education: Goal: Ability to describe self-care measures that may prevent or decrease complications (Diabetes Survival Skills Education) will improve Outcome: Completed/Met Goal: Individualized Educational Video(s) Outcome: Completed/Met   Problem: Coping: Goal: Ability to adjust to condition or change in health will improve Outcome: Completed/Met   Problem:  Fluid Volume: Goal: Ability to maintain a balanced intake and output will improve Outcome: Completed/Met   Problem: Health Behavior/Discharge Planning: Goal: Ability to identify and utilize available resources and services will improve Outcome: Completed/Met Goal: Ability to manage health-related needs will improve Outcome: Completed/Met   Problem: Metabolic: Goal: Ability to maintain appropriate glucose levels will improve Outcome: Completed/Met   Problem: Nutritional: Goal: Maintenance of adequate nutrition will improve Outcome: Completed/Met Goal: Progress toward achieving an optimal weight will improve Outcome: Completed/Met   Problem: Skin Integrity: Goal: Risk for impaired skin integrity will decrease Outcome: Completed/Met   Problem: Tissue Perfusion: Goal: Adequacy of tissue perfusion will improve Outcome: Completed/Met   Problem: Activity: Goal: Ability to tolerate increased activity will improve Outcome: Completed/Met   Problem: Respiratory: Goal: Ability to maintain a clear airway and adequate ventilation will improve Outcome: Completed/Met   Problem: Role Relationship: Goal: Method of communication will improve Outcome: Completed/Met

## 2023-11-02 NOTE — Progress Notes (Signed)
PROGRESS NOTE        PATIENT DETAILS Name: [name] Age: 67 y.o. Sex: male Date of Birth: [birth year] Admit Date: 10/25/2023 Admitting Physician Angie Fava, DO DGL:OVFIEP, [facility]  Brief Summary: Patient is a 67 y.o.  male with history of DM-2, HTN, HLD, frail/cachectic at baseline-presented with altered mental status-found to have HHS-septic shock secondary to PNA/UTI-intubated-and admitted to the ICU.  Briefly required vasopressors in the ICU.  Extubated on 12/17-and transferred to [facility].    Significant events: 12/14>> to ED-altered mental status-admit to [facility]-HHS-started on insulin drip-severe sepsis due to UTI. 12/15>> worsening shortness of breath- intubated-admit to ICU-bronchoscopy-extensive white thick secretions. 12/16>> off pressors 12/17>> extubated 12/18>> transferred to [facility]  Significant studies: 11/19>> CTA chest: Stable aneurysmal disease of distal aortic arch and proximal descending thoracic aorta-5 cm in maximal diameter. 12/14>> CXR: No acute changes.-Massively dilated aortic arch. 12/16>> echo: EF 55-60%.  Significant microbiology data: 12/14>> blood culture: No growth 12/14>> urine culture: Multiple species 12/15>> tracheal aspirate: Normal respiratory flora  Procedures: 12/15-12/17>> ETT 12/15>> bronchoscopy: Extensive white secretions 12/18>>Cortrak tube  Consults: PCCM  Subjective: Patient remains unresponsive for the most part.  Objective: Vitals: Blood pressure (!) 162/93, pulse (!) 108, temperature 97.6 F (36.4 C), temperature source Oral, resp. rate 19, height 5\' 11"  (1.803 m), weight 58.5 kg, SpO2 100%.   Exam:  General appearance: Poorly responsive Nasogastric feeding tube is noted Resp: Clear to auscultation bilaterally.  Normal effort Cardio: S1-S2 is normal regular.  No S3-S4.  No rubs murmurs or bruit GI: Abdomen is soft.  Nontender nondistended.  Bowel sounds are present normal.  No  masses organomegaly   Pertinent Labs/Radiology:    Latest Ref Rng & Units 10/31/2023    2:33 AM 10/30/2023    5:08 AM 10/29/2023    2:25 AM  CBC  WBC 4.0 - 10.5 K/uL 16.3  15.0  19.0   Hemoglobin 13.0 - 17.0 g/dL 32.9  51.8  84.1   Hematocrit 39.0 - 52.0 % 41.7  41.3  35.7   Platelets 150 - 400 K/uL 145  152  111     Lab Results  Component Value Date   NA 148 (H) 11/01/2023   K 3.4 (L) 11/01/2023   CL 115 (H) 11/01/2023   CO2 20 (L) 11/01/2023      Assessment/Plan: Acute hypoxic respiratory failure secondary to PNA Intubated on admission-suspicion for aspiration Improved with IV antibiotics.  He has completed course of Unasyn.  Septic shock secondary to PNA/complicated UTI Required pressors in the ICU Sepsis physiology has resolved Completed course of antibiotics  Acute metabolic encephalopathy Secondary to HHS/septic shock Poorly responsive.  AKI Suspect hemodynamically mediated Improved from 2.9-1.6. Avoid nephrotoxic agents  Hypernatremia Secondary to free water deficit-n.p.o.-secondary to oropharyngeal dysphagia Getting free water through feeding tube.  Sodium levels have improved.  Hypokalemia/hypomagnesemia Supplemented.  Recheck labs tomorrow.  Hypophosphatemia Improved  Anshul hypertension BP stable Continue Coreg  Aortic aneurysm Recent CTA prior to this hospitalization with significant dilatation of the distal aortic arch Currently not in any shape for vascular surgery evaluation-suspect this will need to be pursued in the outpatient setting-as this is a chronic issue. Continue beta-blocker.  HLD Statin  HHS DM-2 (A1c 14.2 on 12/14) with uncontrolled hyperglycemia Noted to be on glargine and SSI.  Dose of glargine was increased yesterday.  COPD Stable Bronchodilators  GERD Pepcid  Oropharyngeal dysphagia Likely due to severe debility/deconditioning Cortrak tube placed 12/18 Underwent modified barium swallow on 12/20.  Remains at  high risk for aspiration.  Debility/deconditioning PT/OT eval-SNF contemplated-however family looking to take him home with hospice  Palliative care DNR in place. Palliative care following.  Plan is for transition to hospice care at home.  Nutrition Status: Nutrition Problem: Severe Malnutrition Etiology: chronic illness Signs/Symptoms: severe muscle depletion, severe fat depletion Interventions: Refer to RD note for recommendations  Code status:   Code Status: Limited: Do not attempt resuscitation (DNR) -DNR-LIMITED -Do Not Intubate/DNI    DVT Prophylaxis: heparin injection 5,000 Units Start: 10/26/23 1400 SCDs Start: 10/25/23 2232   Family Communication: Ex Wife [name] Nelson-[phone]-called on 12/19-unable to leave voicemail-mailbox full.   Disposition Plan: Status is: Inpatient Remains inpatient appropriate because: Severe of illness   Planned Discharge Destination:Skilled nursing facility   Diet: Diet Order             Diet regular Room service appropriate? Yes with Assist; Fluid consistency: Thin  Diet effective now                     Antimicrobial agents: Anti-infectives (From admission, onward)    Start     Dose/Rate Route Frequency Ordered Stop   10/29/23 2000  Ampicillin-Sulbactam (UNASYN) 3 g in sodium chloride 0.9 % 100 mL IVPB        3 g 200 mL/hr over 30 Minutes Intravenous Every 6 hours 10/29/23 1422 11/02/23 0207   10/28/23 0500  vancomycin (VANCOREADY) IVPB 750 mg/150 mL  Status:  Discontinued        750 mg 150 mL/hr over 60 Minutes Intravenous Every 48 hours 10/26/23 0406 10/26/23 0810   10/26/23 2200  cefTRIAXone (ROCEPHIN) 2 g in sodium chloride 0.9 % 100 mL IVPB  Status:  Discontinued        2 g 200 mL/hr over 30 Minutes Intravenous Every 24 hours 10/26/23 0010 10/26/23 0334   10/26/23 1200  piperacillin-tazobactam (ZOSYN) IVPB 3.375 g  Status:  Discontinued        3.375 g 12.5 mL/hr over 240 Minutes Intravenous Every 8 hours  10/26/23 0810 10/29/23 1422   10/26/23 0900  azithromycin (ZITHROMAX) 500 mg in sodium chloride 0.9 % 250 mL IVPB        500 mg 250 mL/hr over 60 Minutes Intravenous Every 24 hours 10/26/23 0807 10/28/23 0925   10/26/23 0445  piperacillin-tazobactam (ZOSYN) IVPB 2.25 g  Status:  Discontinued        2.25 g 100 mL/hr over 30 Minutes Intravenous Every 8 hours 10/26/23 0351 10/26/23 0810   10/26/23 0445  vancomycin (VANCOREADY) IVPB 1250 mg/250 mL        1,250 mg 166.7 mL/hr over 90 Minutes Intravenous  Once 10/26/23 0351 10/26/23 0607   10/25/23 2200  cefTRIAXone (ROCEPHIN) 2 g in sodium chloride 0.9 % 100 mL IVPB  Status:  Discontinued        2 g 200 mL/hr over 30 Minutes Intravenous Every 24 hours 10/25/23 2154 10/26/23 0009        MEDICATIONS: Scheduled Meds:  arformoterol  15 mcg Nebulization BID   atorvastatin  20 mg Per Tube Daily   carvedilol  6.25 mg Per Tube Daily   Chlorhexidine Gluconate Cloth  6 each Topical Daily   docusate  100 mg Per Tube BID   famotidine  20 mg Per Tube QHS   feeding supplement (  PROSource TF20)  60 mL Per Tube Daily   folic acid  1 mg Per Tube Daily   free water  500 mL Per Tube Q4H   guaiFENesin  10 mL Per Tube BID   heparin injection (subcutaneous)  5,000 Units Subcutaneous Q8H   insulin aspart  0-20 Units Subcutaneous Q4H   insulin aspart  3 Units Subcutaneous TID WC   insulin glargine-yfgn  24 Units Subcutaneous Daily   multivitamin with minerals  1 tablet Per Tube Daily   nicotine  14 mg Transdermal Daily   mouth rinse  15 mL Mouth Rinse 4 times per day   polyethylene glycol  17 g Per Tube Daily   revefenacin  175 mcg Nebulization Daily   tamsulosin  0.4 mg Oral Daily   thiamine  100 mg Per Tube Daily   Continuous Infusions:  feeding supplement (OSMOLITE 1.5 CAL) 45 mL/hr at 10/30/23 1223   PRN Meds:.acetaminophen (TYLENOL) oral liquid 160 mg/5 mL **OR** acetaminophen, dextrose, fentaNYL (SUBLIMAZE) injection, melatonin, ondansetron  (ZOFRAN) IV, mouth rinse, phenol   I have personally reviewed following labs and imaging studies  LABORATORY DATA: CBC: Recent Labs  Lab 10/28/23 0723 10/29/23 0225 10/30/23 0508 10/31/23 0233  WBC 16.3* 19.0* 15.0* 16.3*  HGB 10.6* 11.9* 13.5 13.2  HCT 32.0* 35.7* 41.3 41.7  MCV 93.0 92.5 93.0 96.1  PLT 87* 111* 152 145*    Basic Metabolic Panel: Recent Labs  Lab 10/28/23 0723 10/28/23 0729 10/29/23 0225 10/29/23 1139 10/29/23 1644 10/30/23 0508 10/30/23 1845 10/31/23 0233 10/31/23 0823 11/01/23 0507  NA 146*  --  150*  --   --  150*  --  152*  --  148*  K 4.1  --  3.3*  --   --  3.0*  --  5.1  --  3.4*  CL 115*  --  115*  --   --  120*  --  121*  --  115*  CO2 24  --  17*  --   --  20*  --  14*  --  20*  GLUCOSE 175*  --  237*  --   --  365*  --  325*  --  273*  BUN 37*  --  30*  --   --  40*  --  37*  --  29*  CREATININE 1.82*  --  1.83*  --   --  1.78*  --  1.64*  --  1.60*  CALCIUM 8.6*  --  8.7*  --   --  9.1  --  9.4  --  8.2*  MG 2.0  --   --  2.3 2.2 2.3 2.1  --   --  1.6*  PHOS  --    < > 3.5 2.3* 3.5 1.3* 2.2*  --  1.5* 4.4   < > = values in this interval not displayed.    GFR: Estimated Creatinine Clearance: 38.1 mL/min (A) (by C-G formula based on SCr of 1.6 mg/dL (H)).  Liver Function Tests: Recent Labs  Lab 10/30/23 0508  AST 33  ALT 36  ALKPHOS 128*  BILITOT 1.1  PROT 6.9  ALBUMIN 2.6*    Cardiac Enzymes: Recent Labs  Lab 10/26/23 1112  CKTOTAL 79     Urine analysis:    Component Value Date/Time   COLORURINE YELLOW 10/25/2023 1714   APPEARANCEUR CLOUDY (A) 10/25/2023 1714   APPEARANCEUR Clear 03/05/2014 1246   LABSPEC 1.025 10/25/2023 1714   LABSPEC 1.004 03/05/2014 1246  PHURINE 5.0 10/25/2023 1714   GLUCOSEU >=500 (A) 10/25/2023 1714   GLUCOSEU Negative 03/05/2014 1246   HGBUR SMALL (A) 10/25/2023 1714   HGBUR negative 11/30/2010 1511   BILIRUBINUR NEGATIVE 10/25/2023 1714   BILIRUBINUR Negative 03/05/2014 1246    KETONESUR NEGATIVE 10/25/2023 1714   PROTEINUR NEGATIVE 10/25/2023 1714   UROBILINOGEN 1.0 11/30/2010 1511   NITRITE NEGATIVE 10/25/2023 1714   LEUKOCYTESUR LARGE (A) 10/25/2023 1714   LEUKOCYTESUR 2+ 03/05/2014 1246    Sepsis Labs: Lactic Acid, Venous    Component Value Date/Time   LATICACIDVEN 2.4 (HH) 10/26/2023 1112    MICROBIOLOGY: Recent Results (from the past 240 hours)  Culture, blood (routine x 2)     Status: None   Collection Time: 10/25/23  5:07 PM   Specimen: BLOOD  Result Value Ref Range Status   Specimen Description BLOOD SITE NOT SPECIFIED  Final   Special Requests   Final    BOTTLES DRAWN AEROBIC AND ANAEROBIC Blood Culture results may not be optimal due to an inadequate volume of blood received in culture bottles   Culture   Final    NO GROWTH 5 DAYS Performed at [facility], [address] [place], [place] [address]    Report Status 10/30/2023 FINAL  Final  Culture, blood (routine x 2)     Status: None   Collection Time: 10/25/23  5:33 PM   Specimen: BLOOD  Result Value Ref Range Status   Specimen Description BLOOD LEFT ANTECUBITAL  Final   Special Requests   Final    BOTTLES DRAWN AEROBIC AND ANAEROBIC Blood Culture results may not be optimal due to an inadequate volume of blood received in culture bottles   Culture   Final    NO GROWTH 5 DAYS Performed at [facility], [address], [place], [place] [address]    Report Status 10/30/2023 FINAL  Final  Culture, OB Urine     Status: Abnormal   Collection Time: 10/25/23 10:57 PM   Specimen: Urine, Random  Result Value Ref Range Status   Specimen Description URINE, RANDOM  Final   Special Requests   Final    NONE Performed at [facility], [address], [place], [place] [address]    Culture MULTIPLE SPECIES PRESENT, SUGGEST RECOLLECTION (A)  Final   Report Status 10/27/2023 FINAL  Final  Culture, Respiratory w Gram Stain     Status: None   Collection Time: 10/26/23  3:31 AM    Specimen: Tracheal Aspirate; Respiratory  Result Value Ref Range Status   Specimen Description TRACHEAL ASPIRATE  Final   Special Requests NONE  Final   Gram Stain   Final    FEW WBC PRESENT, PREDOMINANTLY PMN FEW GRAM POSITIVE RODS FEW BUDDING YEAST SEEN RARE GRAM NEGATIVE RODS RARE GRAM POSITIVE COCCI    Culture   Final    FEW Normal respiratory flora-no Staph aureus or Pseudomonas seen Performed at [facility], [address], [place], [place] [address]    Report Status 10/28/2023 FINAL  Final  MRSA Next Gen by PCR, Nasal     Status: None   Collection Time: 10/26/23  3:50 AM   Specimen: Nasal Mucosa; Nasal Swab  Result Value Ref Range Status   MRSA by PCR Next Gen NOT DETECTED NOT DETECTED Final    Comment: (NOTE) The GeneXpert MRSA Assay (FDA approved for NASAL specimens only), is one component of a comprehensive MRSA colonization surveillance program. It is  not intended to diagnose MRSA infection nor to guide or monitor treatment for MRSA infections. Test performance is not FDA approved in patients less than 28 years old. Performed at [facility], [address], [place], [place] [address]     RADIOLOGY STUDIES/RESULTS: DG Swallowing Func-Speech Pathology Result Date: 10/31/2023 Table formatting from the original result was not included. Modified Barium Swallow Study Patient Details Name: [name] MRN: [id number] Date of Birth: March 06, 1956 Today's Date: 10/31/2023 HPI/PMH: HPI: [name] is a 67 yo male presenting to ED 12/14 with AMS. Admitted with septic shock due to acute UTI and acute respiratory failure in the setting of PNA (aspiration v community acquired). ETT 12/15-12/17. PMH includes polysubstance abuse, T2DM, HTN, HLD Clinical Impression: Clinical Impression: Pt continues to present with a severe oropharyngeal dysphagia with little to no improvement since previous MBS now further complicated by pt's participation. He was not responsive to  reasoning and did not elicit a cued cough or swallow throughout the entirety of the study. He held boluses in his oral cavity for prolonged amounts of time and refused to expectorate. Pt silently aspirated thin and nectar thick liquids. The majority of each bolus resides in pt's valleculae and pyriform sinuses and pt is unable to participate in use of compensatory strategies to clear. This results in airway invasion after the swallow as well with residuals of all boluses, including purees. Safest diet recommendation would be to continue NPO status. Discussed with MD, who states pt's family is now willing to accept risk of aspiration. SLP will initiate a diet and s/o. Factors that may increase risk of adverse event in presence of aspiration Rubye Oaks & Clearance Coots 2021): Factors that may increase risk of adverse event in presence of aspiration Rubye Oaks & Clearance Coots 2021): Poor general health and/or compromised immunity; Reduced cognitive function; Frail or deconditioned; Inadequate oral hygiene; Weak cough; Aspiration of thick, dense, and/or acidic materials; Frequent aspiration of large volumes Recommendations/Plan: Swallowing Evaluation Recommendations Swallowing Evaluation Recommendations Recommendations: PO diet PO Diet Recommendation: Regular; Thin liquids (Level 0) Liquid Administration via: Cup; Straw Medication Administration: Crushed with puree Supervision: Patient able to self-feed; Full supervision/cueing for swallowing strategies Swallowing strategies  : Minimize environmental distractions; Slow rate; Small bites/sips Postural changes: Position pt fully upright for meals; Stay upright 30-60 min after meals Oral care recommendations: Oral care QID (4x/day); Oral care before ice chips/water; Use suctioning for oral care Treatment Plan Treatment Plan Treatment recommendations: Therapy as outlined in treatment plan below Follow-up recommendations: Home health SLP Functional status assessment: Patient has had a recent  decline in their functional status and/or demonstrates limited ability to make significant improvements in function in a reasonable and predictable amount of time. Recommendations Recommendations for follow up therapy are one component of a multi-disciplinary discharge planning process, led by the attending physician.  Recommendations may be updated based on patient status, additional functional criteria and insurance authorization. Assessment: Orofacial Exam: Orofacial Exam Oral Cavity: Oral Hygiene: Lingual coating; Dried secretions Oral Cavity - Dentition: Poor condition; Missing dentition Orofacial Anatomy: WFL Oral Motor/Sensory Function: WFL Anatomy: Anatomy: Suspected cervical osteophytes Boluses Administered: Boluses Administered Boluses Administered: Thin liquids (Level 0); Mildly thick liquids (Level 2, nectar thick); Puree  Oral Impairment Domain: Oral Impairment Domain Lip Closure: No labial escape Tongue control during bolus hold: Cohesive bolus between tongue to palatal seal Bolus transport/lingual motion: Delayed initiation of tongue motion (oral holding) Oral residue: Trace residue lining oral structures Location of oral residue : Tongue; Palate; Lateral  sulci; Floor of mouth Initiation of pharyngeal swallow : Pyriform sinuses  Pharyngeal Impairment Domain: Pharyngeal Impairment Domain Soft palate elevation: No bolus between soft palate (SP)/pharyngeal wall (PW) Laryngeal elevation: Partial superior movement of thyroid cartilage/partial approximation of arytenoids to epiglottic petiole Anterior hyoid excursion: Partial anterior movement Epiglottic movement: Partial inversion Laryngeal vestibule closure: Incomplete, narrow column air/contrast in laryngeal vestibule Pharyngeal stripping wave : Present - complete Pharyngeal contraction (A/P view only): N/A Pharyngoesophageal segment opening: Complete distension and complete duration, no obstruction of flow Tongue base retraction: Wide column of contrast  or air between tongue base and PPW Pharyngeal residue: Majority of contrast within or on pharyngeal structures Location of pharyngeal residue: Tongue base; Valleculae; Pyriform sinuses  Esophageal Impairment Domain: No data recorded Pill: No data recorded Penetration/Aspiration Scale Score: Penetration/Aspiration Scale Score 5.  Material enters airway, CONTACTS cords and not ejected out: Puree 7.  Material enters airway, passes BELOW cords and not ejected out despite cough attempt by patient: Thin liquids (Level 0) 8.  Material enters airway, passes BELOW cords without attempt by patient to eject out (silent aspiration) : Thin liquids (Level 0); Mildly thick liquids (Level 2, nectar thick) Compensatory Strategies: Compensatory Strategies Compensatory strategies: No   General Information: Caregiver present: No  Diet Prior to this Study: NPO; Cortrak/Small bore NG tube   Temperature : Normal   Respiratory Status: WFL   Supplemental O2: None (Room air)   History of Recent Intubation: Yes  Behavior/Cognition: Alert; Uncooperative; Requires cueing; Agitated Self-Feeding Abilities: Needs assist with self-feeding Baseline vocal quality/speech: Dysphonic Volitional Cough: Unable to elicit Volitional Swallow: Unable to elicit Exam Limitations: Poor participation Goal Planning: Prognosis for improved oropharyngeal function: Fair Barriers to Reach Goals: Cognitive deficits; Time post onset; Severity of deficits No data recorded Patient/Family Stated Goal: wants water Consulted and agree with results and recommendations: Patient; Advanced practice provider Pain: Pain Assessment Pain Assessment: Faces Faces Pain Scale: 0 Pain Location: R hand Pain Descriptors / Indicators: Aching Pain Intervention(s): Monitored during session End of Session: Start Time:SLP Start Time (ACUTE ONLY): 1334 Stop Time: SLP Stop Time (ACUTE ONLY): 1400 Time Calculation:SLP Time Calculation (min) (ACUTE ONLY): 26 min Charges: SLP Evaluations $ SLP  Speech Visit: 1 Visit SLP Evaluations $MBS Swallow: 1 Procedure $Swallowing Treatment: 1 Procedure SLP visit diagnosis: SLP Visit Diagnosis: Dysphagia, oropharyngeal phase (R13.12) Past Medical History: Past Medical History: Diagnosis Date  COPD (chronic obstructive pulmonary disease) (HCC)   Diabetes mellitus without complication (HCC)   Hypertension  Past Surgical History: No past surgical history on file. Gwynneth Aliment, M.A., CF-SLP Speech Language Pathology, Acute Rehabilitation Services Secure Chat preferred [phone] 10/31/2023, 2:51 PM    LOS: 8 days   Osvaldo Shipper, MD  [facility]    To contact the attending provider between 7A-7P or the covering provider during after hours 7P-7A, please log into the web site [email or web address] and access using universal [facility] password for that web site. If you do not have the password, please call the hospital operator.  11/02/2023, 8:26 AM

## 2024-01-01 ENCOUNTER — Other Ambulatory Visit (HOSPITAL_COMMUNITY): Payer: Self-pay

## 2024-04-23 ENCOUNTER — Telehealth: Payer: Self-pay

## 2024-04-23 NOTE — Telephone Encounter (Signed)
 Returned pt's call. Advised Mobile bus on Monday. [place] will [place] at [place].      Copied from CRM [id number]. Topic: Clinical - Medication Question >> Apr 23, 2024  4:33 PM Elle L wrote: Reason for CRM: The patient's friend, Georg Killian, called with the patient on the line. The patient is scheduled for a transfer of care appointment to Primary Care at [facility]. However, the patient's insurance will no longer cover his Insulin  Glargine (BASAGLAR  KWIKPEN) 100 UNIT/ML and he needs a replacement medication called in. His call back number is [phone]. The patient declined having symptoms at this time but he only has 3 days of insulin  left.

## 2024-04-27 ENCOUNTER — Encounter: Payer: Self-pay | Admitting: Student

## 2024-05-24 ENCOUNTER — Ambulatory Visit: Admitting: Family

## 2024-07-06 ENCOUNTER — Encounter: Admitting: Family

## 2024-07-06 NOTE — Progress Notes (Signed)
 Erroneous encounter-disregard
